# Patient Record
Sex: Male | Born: 1970 | Race: Black or African American | Hispanic: No | Marital: Married | Smoking: Former smoker
Health system: Southern US, Community
[De-identification: ages and names within clinical notes are randomized; demographics above are authoritative.]

## PROBLEM LIST (undated history)

## (undated) DIAGNOSIS — E785 Hyperlipidemia, unspecified: Secondary | ICD-10-CM

## (undated) DIAGNOSIS — E291 Testicular hypofunction: Secondary | ICD-10-CM

## (undated) DIAGNOSIS — G4730 Sleep apnea, unspecified: Secondary | ICD-10-CM

## (undated) DIAGNOSIS — R7303 Prediabetes: Secondary | ICD-10-CM

## (undated) DIAGNOSIS — I10 Essential (primary) hypertension: Secondary | ICD-10-CM

## (undated) DIAGNOSIS — E119 Type 2 diabetes mellitus without complications: Secondary | ICD-10-CM

## (undated) HISTORY — DX: Testicular hypofunction: E29.1

## (undated) HISTORY — DX: Essential (primary) hypertension: I10

## (undated) HISTORY — DX: Type 2 diabetes mellitus without complications: E11.9

## (undated) HISTORY — DX: Hyperlipidemia, unspecified: E78.5

## (undated) HISTORY — DX: Sleep apnea, unspecified: G47.30

## (undated) HISTORY — DX: Prediabetes: R73.03

---

## 1998-12-10 HISTORY — PX: SHOULDER ARTHROSCOPY: SHX128

## 1999-03-01 ENCOUNTER — Emergency Department (HOSPITAL_COMMUNITY): Admission: EM | Admit: 1999-03-01 | Discharge: 1999-03-01 | Payer: Self-pay | Admitting: *Deleted

## 1999-03-01 ENCOUNTER — Encounter: Payer: Self-pay | Admitting: Emergency Medicine

## 2001-08-05 ENCOUNTER — Ambulatory Visit (HOSPITAL_BASED_OUTPATIENT_CLINIC_OR_DEPARTMENT_OTHER): Admission: RE | Admit: 2001-08-05 | Discharge: 2001-08-05 | Payer: Self-pay | Admitting: Orthopaedic Surgery

## 2004-11-09 ENCOUNTER — Emergency Department (HOSPITAL_COMMUNITY): Admission: EM | Admit: 2004-11-09 | Discharge: 2004-11-09 | Payer: Self-pay | Admitting: Emergency Medicine

## 2005-08-06 ENCOUNTER — Emergency Department (HOSPITAL_COMMUNITY): Admission: EM | Admit: 2005-08-06 | Discharge: 2005-08-06 | Payer: Self-pay | Admitting: Emergency Medicine

## 2005-12-10 HISTORY — PX: ROTATOR CUFF REPAIR: SHX139

## 2007-08-22 ENCOUNTER — Emergency Department (HOSPITAL_COMMUNITY): Admission: EM | Admit: 2007-08-22 | Discharge: 2007-08-22 | Payer: Self-pay | Admitting: Emergency Medicine

## 2008-08-30 ENCOUNTER — Emergency Department (HOSPITAL_COMMUNITY): Admission: EM | Admit: 2008-08-30 | Discharge: 2008-08-31 | Payer: Self-pay | Admitting: Emergency Medicine

## 2011-03-13 ENCOUNTER — Ambulatory Visit (HOSPITAL_BASED_OUTPATIENT_CLINIC_OR_DEPARTMENT_OTHER): Payer: 59 | Attending: Otolaryngology

## 2011-03-13 DIAGNOSIS — R0609 Other forms of dyspnea: Secondary | ICD-10-CM | POA: Insufficient documentation

## 2011-03-13 DIAGNOSIS — R0989 Other specified symptoms and signs involving the circulatory and respiratory systems: Secondary | ICD-10-CM | POA: Insufficient documentation

## 2011-03-13 DIAGNOSIS — G4733 Obstructive sleep apnea (adult) (pediatric): Secondary | ICD-10-CM | POA: Insufficient documentation

## 2011-03-24 DIAGNOSIS — R0989 Other specified symptoms and signs involving the circulatory and respiratory systems: Secondary | ICD-10-CM

## 2011-03-24 DIAGNOSIS — R0609 Other forms of dyspnea: Secondary | ICD-10-CM

## 2011-03-24 DIAGNOSIS — G4733 Obstructive sleep apnea (adult) (pediatric): Secondary | ICD-10-CM

## 2011-03-25 NOTE — Procedures (Signed)
NAME:  Mike Cohen, Mike Cohen NO.:  [id number]  MEDICAL RECORD NO.:  [id number]          PATIENT TYPE:  OUT  LOCATION:  SLEEP CENTER                 FACILITY:  [facility]  PHYSICIAN:  Clinton D. Maple Hudson, MD, FCCP, FACPDATE OF BIRTH:  05-04-1971  DATE OF STUDY:  03/13/2011                           NOCTURNAL POLYSOMNOGRAM  REFERRING PHYSICIAN:  CHRISTOPHER E NEWMAN  INDICATION FOR STUDY:  Hypersomnia with sleep apnea.  EPWORTH SLEEPINESS SCORE:  11/24, BMI 36.9.  Weight 250 pounds, height 69 inches.  Neck 17 inches.  MEDICATIONS:  Home medications are charted and reviewed.  SLEEP ARCHITECTURE:  Split-study protocol.  During the diagnostic phase, total sleep time 123.5 minutes.  Sleep efficiency 77.4%.  Stage I was 9.7%, stage II 90.3%, stages III and REM were absent.  Sleep latency 16.5 minutes, awake after sleep onset 20.5 minutes, arousal index 30.1.  BEDTIME MEDICATION:  An over-the-counter sleep aid was taken.  RESPIRATORY DATA:  Split-study protocol.  Apnea-hypopnea index (AHI) 31.1 per hour.  A total of 64 events was scored including 28 obstructive apneas, 14 central apneas, 12 mixed apneas, 10 hypopneas.  Events were seen in all sleep positions, especially while supine.  CPAP was then titrated to 12 CWP, AHI 0 per hour.  He wore a medium ResMed Quattro FX mask with heated humidifier.  OXYGEN DATA:  Moderate snoring before CPAP with oxygen desaturation to a nadir of 79% on room air.  With CPAP titration, mean oxygen saturation held 94.5% on room air and snoring was prevented.  CARDIAC DATA:  Normal sinus rhythm.  MOVEMENT-PARASOMNIA:  A few limb jerks were noted during the titration phase with insignificant effect on sleep.  Bathroom x1.  IMPRESSIONS-RECOMMENDATIONS: 1. Moderately severe obstructive sleep apnea/hypopnea syndrome, AHI     31.1 per hour with obstructive and central events noted.  Events     were more common while supine.  Moderate snoring with  oxygen     desaturation to a nadir of 79% on room air. 2. Successful CPAP titration to 12 CWP, AHI 0 per hour.  He wore a     medium ResMed Quattro FX mask with heated humidifier.  Oxygenation     was normalized and snoring prevented.     Clinton D. Maple Hudson, MD, [facility], FACP Diplomate, Biomedical engineer of Sleep Medicine Electronically Signed    CDY/MEDQ  D:  03/24/2011 13:24:47  T:  03/25/2011 05:53:47  Job:  [id number]

## 2011-04-27 NOTE — Op Note (Signed)
[place]  Patient:    Mike Cohen, Mike Cohen Visit Number: [id number] MRN: [id number]          Service Type: DSU Location: [facility] Attending Physician:  Marcene Corning Proc. Date: 08/05/01 Adm. Date:  08/05/2001                             Operative Report  PREOPERATIVE DIAGNOSIS: 1. Right shoulder acromioclavicular pain. 2. Right shoulder instability.  POSTOPERATIVE DIAGNOSIS: 1. Right shoulder acromioclavicular pain. 2. Right shoulder instability.  OPERATION PERFORMED: 1. Right shoulder acromioclavicular resection. 2. Right shoulder thermal capsulorrhaphy.  ANESTHESIA:  General and block.  ATTENDING SURGEON:  Lubertha Basque. Jerl Santos, M.D.  ASSISTANT:  Lindwood Qua, P.A.  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  He went through appropriate postoperative rehabilitation but has been left with a shoulder he does not trust.  He also has pain at the acromioclavicular joint.  He has undergone some preoperative studies which show no labral tear but what appears to be a large loose capsule.  This study was an MRI with an arthrogram. Planned procedure at this point is for thermal capsulorrhaphy through the arthroscope.  We will probably also address his AC joint.  The procedures were discussed with the patient and his family and informed operative consent was obtained after discussion of possible complications of reaction to anesthesia and infection.  DESCRIPTION OF PROCEDURE:  The patient was taken to an operating suite where general anesthetic was applied without difficulty.  He was also given a block in the pre-anesthesia area.  He was positioned in the beach chair position and prepped and draped in normal sterile fashion.  After administration of preop IV antibiotics, an arthroscopy of the right shoulder was performed through a total of four portals.  The  glenohumeral joint showed no degenerative change while the biceps tendon was well attached.  The rotator cuff was benign on undersurface inspection.  His labrum did appear to be attached at least loosely on the middle and inferior portions.  Almost certainly not a normal attachment but there was no frank separation between the labral structures and the glenoid below.  He did have very loose patulous capsule with a large inferior sulcus.  He had a positive drive through sign.  I placed a thermal probe in the shoulder and shrank up the inferior sulcus in the anterior tissues.  This did seem to decrease his amount of external rotation and tightened up the capsule but we did not obtain an excellent response of the tissue.  I then entered the subacromial space.  He had no impingement pain so the acromion was not addressed.  He had pain at the [facility] joint and a thorough AC decompression was done through an anterior portal.  About 1 cm space was created between the clavicle and the acromion.  The shoulder was thoroughly irrigated followed by placement of Marcaine with epinephrine and morphine. Simple sutures of nylon were used to reapproximate the portals followed by Adaptic and a dry gauze dressing with tape.  Estimated blood loss and intraoperative fluids can be obtained from Anesthesia records.  DISPOSITION:  The patient was extubated in the operating room and taken to the recovery room in stable condition.  He had some difficulty coming out from the anesthesia and some desaturation, but was stable and taken to the recovery room.  Plans were  for him to go home the same day and to follow up in the office in less than a week.  I will contact him by phone tonight. Attending Physician:  Marcene Corning DD:  08/05/01 TD:  08/05/01 Job: [id number]

## 2011-07-26 ENCOUNTER — Encounter: Payer: Self-pay | Admitting: Internal Medicine

## 2011-09-10 LAB — COMPREHENSIVE METABOLIC PANEL
ALT: 37
AST: 32
Albumin: 3.7
Alkaline Phosphatase: 68
BUN: 8
GFR calc Af Amer: >60
Potassium: 3.7
Sodium: 140
Total Protein: 6.5

## 2011-09-10 LAB — DIFFERENTIAL
Basophils Relative: 0
Monocytes Absolute: 0.4
Monocytes Relative: 5
Neutro Abs: 3.5

## 2011-09-10 LAB — CBC
Platelets: 288
RDW: 14.4

## 2011-09-10 LAB — URINALYSIS, ROUTINE W REFLEX MICROSCOPIC
Ketones, ur: NEGATIVE
Nitrite: NEGATIVE
Protein, ur: NEGATIVE

## 2011-09-10 LAB — POCT CARDIAC MARKERS: Myoglobin, poc: 108

## 2013-11-02 ENCOUNTER — Telehealth: Payer: Self-pay | Admitting: Internal Medicine

## 2013-11-02 NOTE — Telephone Encounter (Signed)
PT HAVING SINUS INFECTION. EAR RINGING. HEAD CONGESTION. PHELM DARK YELLOW. PLEASE ADVISE IF OFFICE VISIT NEEDED OR RX CALLED IN TO [facility], [address]

## 2013-11-02 NOTE — Telephone Encounter (Signed)
Suggest try otc store brand of sudafed -PE with phenyl ephrine for ear symptoms and if not get better suggest ov

## 2013-11-03 NOTE — Telephone Encounter (Signed)
Agree that he needs appointment if the feel he needs an antibiotic

## 2013-11-03 NOTE — Telephone Encounter (Signed)
Pt's wife Cordelia Pen) aware of suggestions. Wife states that she is almost certain he has a sinus infection. I told her that he needed to make an appointment, but she is requesting antibiotic.

## 2013-11-04 NOTE — Telephone Encounter (Signed)
Pt aware.

## 2013-12-25 ENCOUNTER — Other Ambulatory Visit: Payer: Self-pay | Admitting: Emergency Medicine

## 2013-12-25 MED ORDER — TESTOSTERONE CYPIONATE 200 MG/ML IM SOLN: INTRAMUSCULAR | Status: DC

## 2014-02-10 ENCOUNTER — Encounter: Payer: Self-pay | Admitting: Physician Assistant

## 2014-02-22 DIAGNOSIS — I10 Essential (primary) hypertension: Secondary | ICD-10-CM | POA: Insufficient documentation

## 2014-02-22 DIAGNOSIS — E782 Mixed hyperlipidemia: Secondary | ICD-10-CM | POA: Insufficient documentation

## 2014-02-22 DIAGNOSIS — R7303 Prediabetes: Secondary | ICD-10-CM | POA: Insufficient documentation

## 2014-02-22 DIAGNOSIS — E349 Endocrine disorder, unspecified: Secondary | ICD-10-CM | POA: Insufficient documentation

## 2014-02-23 ENCOUNTER — Encounter: Payer: Self-pay | Admitting: Internal Medicine

## 2014-03-23 ENCOUNTER — Other Ambulatory Visit: Payer: Self-pay | Admitting: Internal Medicine

## 2014-04-02 ENCOUNTER — Ambulatory Visit (INDEPENDENT_AMBULATORY_CARE_PROVIDER_SITE_OTHER): Payer: 59 | Admitting: Internal Medicine

## 2014-04-02 VITALS — BP 140/92 | HR 76 | Temp 98.1°F | Resp 16 | Ht 70.25 in | Wt 253.4 lb

## 2014-04-02 DIAGNOSIS — E785 Hyperlipidemia, unspecified: Secondary | ICD-10-CM

## 2014-04-02 DIAGNOSIS — Z23 Encounter for immunization: Secondary | ICD-10-CM | POA: Insufficient documentation

## 2014-04-02 DIAGNOSIS — E559 Vitamin D deficiency, unspecified: Secondary | ICD-10-CM

## 2014-04-02 DIAGNOSIS — R7309 Other abnormal glucose: Secondary | ICD-10-CM

## 2014-04-02 DIAGNOSIS — Z9989 Dependence on other enabling machines and devices: Secondary | ICD-10-CM

## 2014-04-02 DIAGNOSIS — R7303 Prediabetes: Secondary | ICD-10-CM

## 2014-04-02 DIAGNOSIS — Z79899 Other long term (current) drug therapy: Secondary | ICD-10-CM

## 2014-04-02 DIAGNOSIS — E291 Testicular hypofunction: Secondary | ICD-10-CM

## 2014-04-02 DIAGNOSIS — I10 Essential (primary) hypertension: Secondary | ICD-10-CM

## 2014-04-02 DIAGNOSIS — M79609 Pain in unspecified limb: Secondary | ICD-10-CM

## 2014-04-02 DIAGNOSIS — K589 Irritable bowel syndrome without diarrhea: Secondary | ICD-10-CM | POA: Insufficient documentation

## 2014-04-02 DIAGNOSIS — G4733 Obstructive sleep apnea (adult) (pediatric): Secondary | ICD-10-CM | POA: Insufficient documentation

## 2014-04-02 LAB — CBC WITH DIFFERENTIAL/PLATELET
BASOS ABS: 0 10*3/uL (ref 0.0–0.1)
BASOS PCT: 1 % (ref 0–1)
EOS ABS: 0.2 10*3/uL (ref 0.0–0.7)
Eosinophils Relative: 4 % (ref 0–5)
HEMATOCRIT: 45.6 % (ref 39.0–52.0)
HEMOGLOBIN: 16 g/dL (ref 13.0–17.0)
Lymphocytes Relative: 48 % — ABNORMAL HIGH (ref 12–46)
Lymphs Abs: 2.1 10*3/uL (ref 0.7–4.0)
MCH: 26.6 pg (ref 26.0–34.0)
MCHC: 35.1 g/dL (ref 30.0–36.0)
MCV: 75.7 fL — ABNORMAL LOW (ref 78.0–100.0)
MONO ABS: 0.3 10*3/uL (ref 0.1–1.0)
MONOS PCT: 7 % (ref 3–12)
NEUTROS ABS: 1.8 10*3/uL (ref 1.7–7.7)
Neutrophils Relative %: 40 % — ABNORMAL LOW (ref 43–77)
Platelets: 237 10*3/uL (ref 150–400)
RBC: 6.02 MIL/uL — ABNORMAL HIGH (ref 4.22–5.81)
RDW: 14.4 % (ref 11.5–15.5)
WBC: 4.4 10*3/uL (ref 4.0–10.5)

## 2014-04-02 LAB — HEMOGLOBIN A1C
HEMOGLOBIN A1C: 6.2 % — AB (ref <5.7)
MEAN PLASMA GLUCOSE: 131 mg/dL — AB (ref <117)

## 2014-04-02 NOTE — Patient Instructions (Signed)

## 2014-04-02 NOTE — Progress Notes (Signed)
Patient ID: Mike Cohen, male   DOB: 13-Feb-1971, 43 y.o.   MRN: [id number]    This very nice 43 y.o. MBM presents for 3 month follow up with Hypertension, Hyperlipidemia, OSA/CPAP, Pre-Diabetes and Vitamin D Deficiency.    HTN predates since 2007. BP has been controlled at home. Today's BP: 140/92 mmHg . Patient denies any cardiac type chest pain, palpitations, dyspnea/orthopnea/PND, dizziness, claudication, or dependent edema.   Hyperlipidemia is controlled with diet & meds. Last Cholesterol was 152, Triglycerides were 115, HDL 29 and LDL 100. Patient denies myalgias or other med SE's.    Also, the patient has history of PreDiabetes with A1c 5.9% in Nov 2013 and last A1c was 6.0% in Dec 2013. Patient denies any symptoms of reactive hypoglycemia, diabetic polys, paresthesias or visual blurring.   Further, Patient has history of Vitamin D Deficiency of 2 in 2009 and last vitamin D was 37 in Nov 2013. Patient supplements vitamin D without any suspected side-effects.  Medication Sig  . Cholecalciferol (VITAMIN D PO) Take 2,000 Int'l Units by mouth daily.  . rosuvastatin (CRESTOR) 20 MG tablet Take 20 mg by mouth daily.  . SYRINGE-NEEDLE, DISP, 3 ML (B-D 3CC LUER-LOK SYR 21GX1-1/2) 21G X 1-1/2" 3 ML  Use as directed, DX 250.00  . DEPOTESTOTERONE CYPIONATE 200 MG/ML  2cc q 2weeks   Allergies no known allergies  PMHx:   Past Medical History  Diagnosis Date  . Hypertension   . Prediabetes   . Other testicular hypofunction   . Hyperlipidemia    FHx:    Reviewed / unchanged  SHx:    Reviewed / unchanged   Systems Review: Constitutional: Denies fever, chills, wt changes, headaches, insomnia, fatigue, night sweats, change in appetite. Eyes: Denies redness, blurred vision, diplopia, discharge, itchy, watery eyes.  ENT: Denies discharge, congestion, post nasal drip, epistaxis, sore throat, earache, hearing loss, dental pain, tinnitus, vertigo, sinus pain, snoring.  CV: Denies chest pain,  palpitations, irregular heartbeat, syncope, dyspnea, diaphoresis, orthopnea, PND, claudication, edema. Respiratory: denies cough, dyspnea, DOE, pleurisy, hoarseness, laryngitis, wheezing.  Gastrointestinal: Denies dysphagia, odynophagia, heartburn, reflux, water brash, abdominal pain or cramps, nausea, vomiting, bloating, diarrhea, constipation, hematemesis, melena, hematochezia,  or hemorrhoids. Genitourinary: Denies dysuria, frequency, urgency, nocturia, hesitancy, discharge, hematuria, flank pain. Musculoskeletal: Denies arthralgias, myalgias, stiffness, jt. swelling, pain, limp, strain/sprain.  Skin: Denies pruritus, rash, hives, warts, acne, eczema, change in skin lesion(s). Neuro: No weakness, tremor, incoordination, spasms, paresthesia, or pain. Psychiatric: Denies confusion, memory loss, or sensory loss. Endo: Denies change in weight, skin, hair change.  Heme/Lymph: No excessive bleeding, bruising, orenlarged lymph nodes.  Exam:  BP 140/92  Pulse 76  Temp 98.1 F   Resp 16  Ht 5' 10.25"   Wt 253 lb 6.4 oz   BMI 36.11 kg/m2  Appears well nourished - in no distress. Eyes: PERRLA, EOMs, conjunctiva no swelling or erythema. Sinuses: No frontal/maxillary tenderness ENT/Mouth: EAC's clear, TM's nl w/o erythema, bulging. Nares clear w/o erythema, swelling, exudates. Oropharynx clear without erythema or exudates. Oral hygiene is good. Tongue normal, non obstructing. Hearing intact.  Neck: Supple. Thyroid nl. Car 2+/2+ without bruits, nodes or JVD. Chest: Respirations nl with BS clear & equal w/o rales, rhonchi, wheezing or stridor.  Cor: Heart sounds normal w/ regular rate and rhythm without sig. murmurs, gallops, clicks, or rubs. Peripheral pulses normal and equal  without edema.  Abdomen: Soft & bowel sounds normal. Non-tender w/o guarding, rebound, hernias, masses, or organomegaly.  Lymphatics: Unremarkable.  Musculoskeletal: Full ROM  all peripheral extremities, joint stability, 5/5  strength, and normal gait.  Skin: Warm, dry without exposed rashes, lesions, ecchymosis apparent.  Neuro: Cranial nerves intact, reflexes equal bilaterally. Sensory-motor testing grossly intact. Tendon reflexes grossly intact.  Pysch: Alert & oriented x 3. Insight and judgement nl & appropriate. No ideations.  Assessment and Plan:  1. Hypertension - Continue monitor blood pressure at home. Continue diet/meds same.  2. Hyperlipidemia - Continue diet/meds, exercise,& lifestyle modifications. Continue monitor periodic cholesterol/liver & renal functions   3. Pre-diabetes - Continue diet, exercise, lifestyle modifications. Monitor appropriate labs.  4. Vitamin D Deficiency - Continue supplementation.  Recommended regular exercise, BP monitoring, weight control, and discussed med and SE's. Recommended labs to assess and monitor clinical status. Further disposition pending results of labs.

## 2014-04-03 ENCOUNTER — Encounter: Payer: Self-pay | Admitting: Internal Medicine

## 2014-04-03 LAB — LIPID PANEL
CHOLESTEROL: 129 mg/dL (ref 0–200)
HDL: 27 mg/dL — ABNORMAL LOW (ref >39)
LDL Cholesterol: 73 mg/dL (ref 0–99)
TRIGLYCERIDES: 143 mg/dL (ref <150)
Total CHOL/HDL Ratio: 4.8 Ratio
VLDL: 29 mg/dL (ref 0–40)

## 2014-04-03 LAB — INSULIN, FASTING: INSULIN FASTING, SERUM: 14 u[IU]/mL (ref 3–28)

## 2014-04-03 LAB — HEPATIC FUNCTION PANEL
ALBUMIN: 4.3 g/dL (ref 3.5–5.2)
ALT: 35 U/L (ref 0–53)
AST: 47 U/L — ABNORMAL HIGH (ref 0–37)
Alkaline Phosphatase: 44 U/L (ref 39–117)
Bilirubin, Direct: 0.1 mg/dL (ref 0.0–0.3)
Indirect Bilirubin: 0.4 mg/dL (ref 0.2–1.2)
TOTAL PROTEIN: 6.9 g/dL (ref 6.0–8.3)
Total Bilirubin: 0.5 mg/dL (ref 0.2–1.2)

## 2014-04-03 LAB — MAGNESIUM: MAGNESIUM: 1.8 mg/dL (ref 1.5–2.5)

## 2014-04-03 LAB — BASIC METABOLIC PANEL WITH GFR
BUN: 13 mg/dL (ref 6–23)
CALCIUM: 9.4 mg/dL (ref 8.4–10.5)
CO2: 27 mEq/L (ref 19–32)
CREATININE: 1.1 mg/dL (ref 0.50–1.35)
Chloride: 103 mEq/L (ref 96–112)
GFR, EST NON AFRICAN AMERICAN: 82 mL/min
GLUCOSE: 82 mg/dL (ref 70–99)
Potassium: 4.6 mEq/L (ref 3.5–5.3)
Sodium: 142 mEq/L (ref 135–145)

## 2014-04-03 LAB — TESTOSTERONE: TESTOSTERONE: 356 ng/dL (ref 300–890)

## 2014-04-03 LAB — TSH: TSH: 0.501 u[IU]/mL (ref 0.350–4.500)

## 2014-04-03 LAB — VITAMIN D 25 HYDROXY (VIT D DEFICIENCY, FRACTURES): VIT D 25 HYDROXY: 91 ng/mL — AB (ref 30–89)

## 2014-04-03 LAB — CK: CK TOTAL: 1723 U/L — AB (ref 7–232)

## 2014-05-17 ENCOUNTER — Other Ambulatory Visit: Payer: Self-pay | Admitting: Emergency Medicine

## 2014-05-17 ENCOUNTER — Other Ambulatory Visit: Payer: Self-pay | Admitting: *Deleted

## 2014-05-17 MED ORDER — "SYRINGE/NEEDLE (DISP) 21G X 1-1/2"" 3 ML MISC": Status: DC

## 2014-05-17 MED ORDER — TESTOSTERONE CYPIONATE 200 MG/ML IM SOLN: INTRAMUSCULAR | Status: DC

## 2014-07-12 ENCOUNTER — Other Ambulatory Visit: Payer: Self-pay | Admitting: Internal Medicine

## 2014-07-12 MED ORDER — TADALAFIL 20 MG PO TABS: ORAL_TABLET | ORAL | Status: DC

## 2014-07-23 ENCOUNTER — Other Ambulatory Visit: Payer: Self-pay | Admitting: *Deleted

## 2014-07-23 MED ORDER — TADALAFIL 20 MG PO TABS: ORAL_TABLET | ORAL | Status: DC

## 2014-09-22 ENCOUNTER — Ambulatory Visit (INDEPENDENT_AMBULATORY_CARE_PROVIDER_SITE_OTHER): Payer: 59 | Admitting: Internal Medicine

## 2014-09-22 ENCOUNTER — Ambulatory Visit: Payer: Self-pay | Admitting: Physician Assistant

## 2014-09-22 ENCOUNTER — Encounter: Payer: Self-pay | Admitting: Internal Medicine

## 2014-09-22 VITALS — BP 122/80 | HR 84 | Temp 98.1°F | Resp 16 | Ht 70.25 in | Wt 256.4 lb

## 2014-09-22 DIAGNOSIS — R7303 Prediabetes: Secondary | ICD-10-CM

## 2014-09-22 DIAGNOSIS — E785 Hyperlipidemia, unspecified: Secondary | ICD-10-CM

## 2014-09-22 DIAGNOSIS — Z23 Encounter for immunization: Secondary | ICD-10-CM

## 2014-09-22 DIAGNOSIS — I10 Essential (primary) hypertension: Secondary | ICD-10-CM

## 2014-09-22 DIAGNOSIS — R7309 Other abnormal glucose: Secondary | ICD-10-CM

## 2014-09-22 DIAGNOSIS — E559 Vitamin D deficiency, unspecified: Secondary | ICD-10-CM

## 2014-09-22 DIAGNOSIS — Z79899 Other long term (current) drug therapy: Secondary | ICD-10-CM

## 2014-09-22 LAB — CBC WITH DIFFERENTIAL/PLATELET
BASOS ABS: 0 10*3/uL (ref 0.0–0.1)
BASOS PCT: 0 % (ref 0–1)
EOS ABS: 0.1 10*3/uL (ref 0.0–0.7)
Eosinophils Relative: 2 % (ref 0–5)
HCT: 49 % (ref 39.0–52.0)
HEMOGLOBIN: 17 g/dL (ref 13.0–17.0)
Lymphocytes Relative: 45 % (ref 12–46)
Lymphs Abs: 2.6 10*3/uL (ref 0.7–4.0)
MCH: 27.1 pg (ref 26.0–34.0)
MCHC: 34.7 g/dL (ref 30.0–36.0)
MCV: 78.1 fL (ref 78.0–100.0)
MONO ABS: 0.3 10*3/uL (ref 0.1–1.0)
MONOS PCT: 6 % (ref 3–12)
NEUTROS PCT: 47 % (ref 43–77)
Neutro Abs: 2.7 10*3/uL (ref 1.7–7.7)
Platelets: 255 10*3/uL (ref 150–400)
RBC: 6.27 MIL/uL — ABNORMAL HIGH (ref 4.22–5.81)
RDW: 14.3 % (ref 11.5–15.5)
WBC: 5.8 10*3/uL (ref 4.0–10.5)

## 2014-09-22 LAB — HEMOGLOBIN A1C
HEMOGLOBIN A1C: 6 % — AB (ref <5.7)
MEAN PLASMA GLUCOSE: 126 mg/dL — AB (ref <117)

## 2014-09-22 MED ORDER — DOXYCYCLINE HYCLATE 100 MG PO CAPS
ORAL_CAPSULE | ORAL | Status: DC
Start: 2014-09-22 — End: 2015-07-20

## 2014-09-22 NOTE — Patient Instructions (Signed)

## 2014-09-22 NOTE — Progress Notes (Signed)
Patient ID: Mike Cohen, male   DOB: [birth year], 43 y.o.   MRN: [id number]   This very nice 43 y.o.MWM presents for 3 month follow up with Hypertension, Hyperlipidemia, Pre-Diabetes and Vitamin D Deficiency. Pt also c/o of tender lumps in each cheek and a bumpy rash on th posterior neck.   Patient is treated for HTN (2007) & BP has been controlled at home. Today's BP: 122/80 mmHg. Patient has had no complaints of any cardiac type chest pain, palpitations, dyspnea/orthopnea/PND, dizziness, claudication, or dependent edema.   Hyperlipidemia is controlled with diet & meds. Patient denies myalgias or other med SE's. Last Lipids were Total Cholesterol 129; HDL  27*; LDL  73; Triglycerides 143 on 04/02/2014.   Also, the patient has history of T2_NIDDM PreDiabetes and has had no symptoms of reactive hypoglycemia, diabetic polys, paresthesias or visual blurring.  Last A1c was  6.2% on 04/02/2014.    Further, the patient also has history of Vitamin D Deficiency and supplements vitamin D without any suspected side-effects. Last vitamin D was  91 on 04/02/2014.   Medication List   rosuvastatin 20 MG tablet  Commonly known as:  CRESTOR  Take 20 mg by mouth daily.     SYRINGE-NEEDLE (DISP) 3 ML 21G X 1-1/2" 3 ML Misc  Commonly known as:  B-D 3CC LUER-LOK SYR 21GX1-1/2  Use as directed, DX 250.00     tadalafil 20 MG tablet  Commonly known as:  CIALIS  Take 1/2 to 1 tablet every 2 to 3 days as needed for XXXX     testosterone cypionate 200 MG/ML injection  Commonly known as:  DEPOTESTOTERONE CYPIONATE  2cc q 2weeks     VITAMIN D PO  Take 2,000 Int'l Units by mouth daily.     No Known Allergies  PMHx:   Past Medical History  Diagnosis Date  . Hypertension   . Prediabetes   . Other testicular hypofunction   . Hyperlipidemia    Immunization History  Administered Date(s) Administered  . Influenza Split 09/22/2014  . Td 10/13/2002   Past Surgical History  Procedure Laterality Date  .  Shoulder arthroscopy Right 2000  . Rotator cuff repair Right 2007   FHx:    Reviewed / unchanged  SHx:    Reviewed / unchanged  Systems Review:  Constitutional: Denies fever, chills, wt changes, headaches, insomnia, fatigue, night sweats, change in appetite. Eyes: Denies redness, blurred vision, diplopia, discharge, itchy, watery eyes.  ENT: Denies discharge, congestion, post nasal drip, epistaxis, sore throat, earache, hearing loss, dental pain, tinnitus, vertigo, sinus pain, snoring.  CV: Denies chest pain, palpitations, irregular heartbeat, syncope, dyspnea, diaphoresis, orthopnea, PND, claudication or edema. Respiratory: denies cough, dyspnea, DOE, pleurisy, hoarseness, laryngitis, wheezing.  Gastrointestinal: Denies dysphagia, odynophagia, heartburn, reflux, water brash, abdominal pain or cramps, nausea, vomiting, bloating, diarrhea, constipation, hematemesis, melena, hematochezia  or hemorrhoids. Genitourinary: Denies dysuria, frequency, urgency, nocturia, hesitancy, discharge, hematuria or flank pain. Musculoskeletal: Denies arthralgias, myalgias, stiffness, jt. swelling, pain, limping or strain/sprain.  Skin: Denies pruritus, rash, hives, warts, acne, eczema or change in skin lesion(s). Neuro: No weakness, tremor, incoordination, spasms, paresthesia or pain. Psychiatric: Denies confusion, memory loss or sensory loss. Endo: Denies change in weight, skin or hair change.  Heme/Lymph: No excessive bleeding, bruising or enlarged lymph nodes.  Exam:  BP 122/80  Pulse 84  Temp(Src) 98.1 F (36.7 C) (Temporal)  Resp 16  Ht 5' 10.25" (1.784 m)  Wt 256 lb 6.4 oz (116.302 kg)  BMI 36.54 kg/m2  Appears well nourished and in no distress. Eyes: PERRLA, EOMs, conjunctiva no swelling or erythema. Sinuses: No frontal/maxillary tenderness ENT/Mouth: EAC's clear, TM's nl w/o erythema, bulging. Nares clear w/o erythema, swelling, exudates. Oropharynx clear without erythema or exudates. Oral  hygiene is good. Tongue normal, non obstructing. Hearing intact.  Neck: Supple. Thyroid nl. Car 2+/2+ without bruits, nodes or JVD. Chest: Respirations nl with BS clear & equal w/o rales, rhonchi, wheezing or stridor.  Cor: Heart sounds normal w/ regular rate and rhythm without sig. murmurs, gallops, clicks, or rubs. Peripheral pulses normal and equal  without edema.  Abdomen: Soft & bowel sounds normal. Non-tender w/o guarding, rebound, hernias, masses, or organomegaly.  Lymphatics: Unremarkable.  Musculoskeletal: Full ROM all peripheral extremities, joint stability, 5/5 strength, and normal gait.  Skin: Warm, dry without exposed rashes or ecchymosis apparent. Has a crop of ~ # 20  fleshy firm raised 3-4 mm lesions on nape of neck. Also 1 cm subcutaneous cystic not fixed lesions of Lt zygoma area  & above Rt nasolabial fold.  Neuro: Cranial nerves intact, reflexes equal bilaterally. Sensory-motor testing grossly intact. Tendon reflexes grossly intact.  Pysch: Alert & oriented x 3.  Insight and judgement nl & appropriate. No ideations.  Assessment and Plan:  1. Hypertension - Continue monitor blood pressure at home. Continue diet/meds same.  2. Hyperlipidemia - Continue diet/meds, exercise,& lifestyle modifications. Continue monitor periodic cholesterol/liver & renal functions   3. Pre-Diabetes - Continue diet, exercise, lifestyle modifications. Monitor appropriate labs.  4. Vitamin D Deficiency - Continue supplementation.  5. Infected sebaceous cysts of Bilat face. - Rx Doxycycline 100 mg #30 x 1 rf - bid x 2 weeks   6. 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 Recommended labs to assess and monitor clinical status. Further disposition pending results of labs.

## 2014-09-23 LAB — HEPATIC FUNCTION PANEL
ALT: 35 U/L (ref 0–53)
AST: 35 U/L (ref 0–37)
Albumin: 4.6 g/dL (ref 3.5–5.2)
Alkaline Phosphatase: 44 U/L (ref 39–117)
Bilirubin, Direct: 0.1 mg/dL (ref 0.0–0.3)
Indirect Bilirubin: 0.5 mg/dL (ref 0.2–1.2)
TOTAL PROTEIN: 6.9 g/dL (ref 6.0–8.3)
Total Bilirubin: 0.6 mg/dL (ref 0.2–1.2)

## 2014-09-23 LAB — MAGNESIUM: MAGNESIUM: 1.7 mg/dL (ref 1.5–2.5)

## 2014-09-23 LAB — LIPID PANEL
CHOLESTEROL: 152 mg/dL (ref 0–200)
HDL: 25 mg/dL — ABNORMAL LOW (ref >39)
LDL Cholesterol: 72 mg/dL (ref 0–99)
Total CHOL/HDL Ratio: 6.1 Ratio
Triglycerides: 276 mg/dL — ABNORMAL HIGH (ref <150)
VLDL: 55 mg/dL — ABNORMAL HIGH (ref 0–40)

## 2014-09-23 LAB — BASIC METABOLIC PANEL WITH GFR
BUN: 9 mg/dL (ref 6–23)
CALCIUM: 9.8 mg/dL (ref 8.4–10.5)
CO2: 21 mEq/L (ref 19–32)
Chloride: 102 mEq/L (ref 96–112)
Creat: 1.25 mg/dL (ref 0.50–1.35)
GFR, EST AFRICAN AMERICAN: 82 mL/min
GFR, EST NON AFRICAN AMERICAN: 71 mL/min
GLUCOSE: 127 mg/dL — AB (ref 70–99)
Potassium: 4 mEq/L (ref 3.5–5.3)
SODIUM: 138 meq/L (ref 135–145)

## 2014-09-23 LAB — VITAMIN D 25 HYDROXY (VIT D DEFICIENCY, FRACTURES): VIT D 25 HYDROXY: 104 ng/mL — AB (ref 30–89)

## 2014-09-23 LAB — TSH: TSH: 1.146 u[IU]/mL (ref 0.350–4.500)

## 2014-09-23 LAB — INSULIN, FASTING: INSULIN FASTING, SERUM: 75 u[IU]/mL — AB (ref 2.0–19.6)

## 2014-10-06 ENCOUNTER — Ambulatory Visit: Payer: Self-pay | Admitting: Internal Medicine

## 2015-02-10 ENCOUNTER — Other Ambulatory Visit: Payer: Self-pay | Admitting: Internal Medicine

## 2015-02-10 DIAGNOSIS — E349 Endocrine disorder, unspecified: Secondary | ICD-10-CM

## 2015-02-24 ENCOUNTER — Encounter: Payer: Self-pay | Admitting: Internal Medicine

## 2015-06-13 ENCOUNTER — Other Ambulatory Visit: Payer: Self-pay | Admitting: Internal Medicine

## 2015-07-20 ENCOUNTER — Encounter: Payer: Self-pay | Admitting: Internal Medicine

## 2015-07-20 ENCOUNTER — Ambulatory Visit (INDEPENDENT_AMBULATORY_CARE_PROVIDER_SITE_OTHER): Payer: 59 | Admitting: Internal Medicine

## 2015-07-20 VITALS — BP 126/84 | HR 88 | Temp 97.5°F | Resp 16 | Ht 70.25 in | Wt 250.0 lb

## 2015-07-20 DIAGNOSIS — Z79899 Other long term (current) drug therapy: Secondary | ICD-10-CM

## 2015-07-20 DIAGNOSIS — R7303 Prediabetes: Secondary | ICD-10-CM

## 2015-07-20 DIAGNOSIS — Z9989 Dependence on other enabling machines and devices: Secondary | ICD-10-CM

## 2015-07-20 DIAGNOSIS — E291 Testicular hypofunction: Secondary | ICD-10-CM | POA: Diagnosis not present

## 2015-07-20 DIAGNOSIS — R7309 Other abnormal glucose: Secondary | ICD-10-CM

## 2015-07-20 DIAGNOSIS — I10 Essential (primary) hypertension: Secondary | ICD-10-CM

## 2015-07-20 DIAGNOSIS — G4733 Obstructive sleep apnea (adult) (pediatric): Secondary | ICD-10-CM | POA: Diagnosis not present

## 2015-07-20 DIAGNOSIS — E559 Vitamin D deficiency, unspecified: Secondary | ICD-10-CM

## 2015-07-20 DIAGNOSIS — E349 Endocrine disorder, unspecified: Secondary | ICD-10-CM

## 2015-07-20 DIAGNOSIS — E785 Hyperlipidemia, unspecified: Secondary | ICD-10-CM | POA: Diagnosis not present

## 2015-07-20 LAB — HEPATIC FUNCTION PANEL
ALBUMIN: 4.2 g/dL (ref 3.6–5.1)
ALK PHOS: 33 U/L — AB (ref 40–115)
ALT: 40 U/L (ref 9–46)
AST: 47 U/L — AB (ref 10–40)
BILIRUBIN DIRECT: 0.1 mg/dL (ref <=0.2)
BILIRUBIN INDIRECT: 0.5 mg/dL (ref 0.2–1.2)
BILIRUBIN TOTAL: 0.6 mg/dL (ref 0.2–1.2)
Total Protein: 6.6 g/dL (ref 6.1–8.1)

## 2015-07-20 LAB — MAGNESIUM: MAGNESIUM: 1.8 mg/dL (ref 1.5–2.5)

## 2015-07-20 LAB — CBC WITH DIFFERENTIAL/PLATELET
Basophils Absolute: 0 10*3/uL (ref 0.0–0.1)
Basophils Relative: 0 % (ref 0–1)
EOS ABS: 0.2 10*3/uL (ref 0.0–0.7)
EOS PCT: 3 % (ref 0–5)
HEMATOCRIT: 48.7 % (ref 39.0–52.0)
HEMOGLOBIN: 16.7 g/dL (ref 13.0–17.0)
LYMPHS ABS: 1.9 10*3/uL (ref 0.7–4.0)
Lymphocytes Relative: 37 % (ref 12–46)
MCH: 26.6 pg (ref 26.0–34.0)
MCHC: 34.3 g/dL (ref 30.0–36.0)
MCV: 77.5 fL — ABNORMAL LOW (ref 78.0–100.0)
MPV: 9.1 fL (ref 8.6–12.4)
Monocytes Absolute: 0.3 10*3/uL (ref 0.1–1.0)
Monocytes Relative: 5 % (ref 3–12)
NEUTROS ABS: 2.8 10*3/uL (ref 1.7–7.7)
NEUTROS PCT: 55 % (ref 43–77)
Platelets: 205 10*3/uL (ref 150–400)
RBC: 6.28 MIL/uL — ABNORMAL HIGH (ref 4.22–5.81)
RDW: 14.7 % (ref 11.5–15.5)
WBC: 5 10*3/uL (ref 4.0–10.5)

## 2015-07-20 LAB — BASIC METABOLIC PANEL WITH GFR
BUN: 10 mg/dL (ref 7–25)
CALCIUM: 9.5 mg/dL (ref 8.6–10.3)
CHLORIDE: 103 mmol/L (ref 98–110)
CO2: 22 mmol/L (ref 20–31)
Creat: 1.01 mg/dL (ref 0.60–1.35)
GFR, Est African American: >89 mL/min (ref >=60)
GFR, Est Non African American: >89 mL/min (ref >=60)
GLUCOSE: 120 mg/dL — AB (ref 65–99)
POTASSIUM: 3.8 mmol/L (ref 3.5–5.3)
SODIUM: 137 mmol/L (ref 135–146)

## 2015-07-20 LAB — LIPID PANEL
CHOLESTEROL: 142 mg/dL (ref 125–200)
HDL: 23 mg/dL — AB (ref >=40)
LDL Cholesterol: 80 mg/dL (ref <130)
Total CHOL/HDL Ratio: 6.2 Ratio — ABNORMAL HIGH (ref <=5.0)
Triglycerides: 193 mg/dL — ABNORMAL HIGH (ref <150)
VLDL: 39 mg/dL — ABNORMAL HIGH (ref <30)

## 2015-07-20 NOTE — Patient Instructions (Signed)

## 2015-07-20 NOTE — Progress Notes (Signed)
Patient ID: Mike Cohen, male   DOB: [birth year], 44 y.o.   MRN: [id number]     This very nice 44 y.o. MBM presents for 6 month follow up with Hypertension, Hyperlipidemia, Pre-Diabetes and Vitamin D Deficiency.      Patient is treated for HTNsince 2007 & BP has been controlled at home. Today's BP: 126/84 mmHg. Patient has had no complaints of any cardiac type chest pain, palpitations, dyspnea/orthopnea/PND, dizziness, claudication, or dependent edema.     Hyperlipidemia is controlled with diet . Patient denies myalgias or other med SE's. Last Lipids were at goal - Cholesterol 152; HDL 25*; LDL 72; with elevated Triglycerides 276 on 09/22/2014.      Also, the patient has history of PreDiabetes since Aug 2012 with A1c 6.5% an later 6.1% in 2012 and has had no symptoms of reactive hypoglycemia, diabetic polys, paresthesias or visual blurring.  He has attempted dietary compliance and last A1c was still high at 6.0% on 09/22/2014 commensurate with his dietary non compliance     Further, the patient also has history of Vitamin D Deficiency of "9" in 2009 and supplements vitamin D without any suspected side-effects. Last vitamin D was 104 on 09/22/2014.  Medication Sig  . VITAMIN D  Take 2,000 Int'l Units by mouth daily.  . tadalafil (CIALIS) 20 MG tablet Take 1/2 to 1 tablet every 2 to 3 days as needed for XXXX  . DEPO-TESTOTERONE  200 MG/ML inj INJECT 1 Ml / IM  EVERY 1 WEEK   No Known Allergies  PMHx:   Past Medical History  Diagnosis Date  . Hypertension   . Prediabetes   . Other testicular hypofunction   . Hyperlipidemia    Immunization History  Administered Date(s) Administered  . Influenza Split 09/22/2014  . Td 10/13/2002   Past Surgical History  Procedure Laterality Date  . Shoulder arthroscopy Right 2000  . Rotator cuff repair Right 2007   FHx:    Reviewed / unchanged  SHx:    Reviewed / unchanged  Systems Review:  Constitutional: Denies fever, chills, wt changes,  headaches, insomnia, fatigue, night sweats, change in appetite. Eyes: Denies redness, blurred vision, diplopia, discharge, itchy, watery eyes.  ENT: Denies discharge, congestion, post nasal drip, epistaxis, sore throat, earache, hearing loss, dental pain, tinnitus, vertigo, sinus pain, snoring.  CV: Denies chest pain, palpitations, irregular heartbeat, syncope, dyspnea, diaphoresis, orthopnea, PND, claudication or edema. Respiratory: denies cough, dyspnea, DOE, pleurisy, hoarseness, laryngitis, wheezing.  Gastrointestinal: Denies dysphagia, odynophagia, heartburn, reflux, water brash, abdominal pain or cramps, nausea, vomiting, bloating, diarrhea, constipation, hematemesis, melena, hematochezia  or hemorrhoids. Genitourinary: Denies dysuria, frequency, urgency, nocturia, hesitancy, discharge, hematuria or flank pain. Musculoskeletal: Denies arthralgias, myalgias, stiffness, jt. swelling, pain, limping or strain/sprain.  Skin: Denies pruritus, rash, hives, warts, acne, eczema or change in skin lesion(s). Neuro: No weakness, tremor, incoordination, spasms, paresthesia or pain. Psychiatric: Denies confusion, memory loss or sensory loss. Endo: Denies change in weight, skin or hair change.  Heme/Lymph: No excessive bleeding, bruising or enlarged lymph nodes.  Physical Exam  BP 126/84   Pulse 88  Temp 97.5 F  Resp 16  Ht 5' 10.25"   Wt 250 lb     BMI 35.63   Appears over nourished, morbidly obese and in no distress.  Eyes: PERRLA, EOMs, conjunctiva no swelling or erythema. Sinuses: No frontal/maxillary tenderness ENT/Mouth: EAC's clear, TM's nl w/o erythema, bulging. Nares clear w/o erythema, swelling, exudates. Oropharynx clear without erythema or exudates. Oral hygiene is good. Tongue  normal, non obstructing. Hearing intact.  Neck: Supple. Thyroid nl. Car 2+/2+ without bruits, nodes or JVD. Chest: Respirations nl with BS clear & equal w/o rales, rhonchi, wheezing or stridor.  Cor: Heart  sounds normal w/ regular rate and rhythm without sig. murmurs, gallops, clicks, or rubs. Peripheral pulses normal and equal  without edema.  Abdomen: Soft & bowel sounds normal. Non-tender w/o guarding, rebound, hernias, masses, or organomegaly.  Lymphatics: Unremarkable.  Musculoskeletal: Full ROM all peripheral extremities, joint stability, 5/5 strength, and normal gait.  Skin: Warm, dry without exposed rashes, lesions or ecchymosis apparent.  Neuro: Cranial nerves intact, reflexes equal bilaterally. Sensory-motor testing grossly intact. Tendon reflexes grossly intact.  Pysch: Alert & oriented x 3.  Insight and judgement nl & appropriate. No ideations.  Assessment and Plan:  1. Essential hypertension  - TSH  2. Hyperlipidemia  - Lipid panel  3. Prediabetes  - Hemoglobin A1c - Insulin, random  4. Vitamin D deficiency  - Vit D  25 hydroxy   5. Testosterone deficiency  - Testosterone  6. OSA on CPAP   7. Medication management  - CBC with Differential/Platelet - BASIC METABOLIC PANEL WITH GFR - Hepatic function panel - Magnesium   Recommended regular exercise, BP monitoring, weight control, and discussed med and SE's. Recommended labs to assess and monitor clinical status. Further disposition pending results of labs. Over 30 minutes of exam, counseling, chart review was performed

## 2015-07-21 LAB — TESTOSTERONE: TESTOSTERONE: 941 ng/dL — AB (ref 300–890)

## 2015-07-21 LAB — VITAMIN D 25 HYDROXY (VIT D DEFICIENCY, FRACTURES): VIT D 25 HYDROXY: 35 ng/mL (ref 30–100)

## 2015-07-21 LAB — HEMOGLOBIN A1C
Hgb A1c MFr Bld: 6.1 % — ABNORMAL HIGH (ref <5.7)
MEAN PLASMA GLUCOSE: 128 mg/dL — AB (ref <117)

## 2015-07-21 LAB — TSH: TSH: 0.592 u[IU]/mL (ref 0.350–4.500)

## 2015-07-21 LAB — INSULIN, RANDOM: Insulin: 146.5 u[IU]/mL — ABNORMAL HIGH (ref 2.0–19.6)

## 2015-08-29 ENCOUNTER — Other Ambulatory Visit: Payer: Self-pay | Admitting: Internal Medicine

## 2015-08-29 NOTE — Telephone Encounter (Signed)
Rx called in 

## 2015-09-19 ENCOUNTER — Encounter: Payer: Self-pay | Admitting: Internal Medicine

## 2015-09-19 ENCOUNTER — Ambulatory Visit (INDEPENDENT_AMBULATORY_CARE_PROVIDER_SITE_OTHER): Payer: 59 | Admitting: Internal Medicine

## 2015-09-19 VITALS — BP 126/82 | HR 84 | Temp 97.7°F | Resp 16 | Ht 70.5 in | Wt 257.8 lb

## 2015-09-19 DIAGNOSIS — Z0000 Encounter for general adult medical examination without abnormal findings: Secondary | ICD-10-CM | POA: Diagnosis not present

## 2015-09-19 DIAGNOSIS — Z6836 Body mass index (BMI) 36.0-36.9, adult: Secondary | ICD-10-CM

## 2015-09-19 DIAGNOSIS — Z111 Encounter for screening for respiratory tuberculosis: Secondary | ICD-10-CM | POA: Diagnosis not present

## 2015-09-19 DIAGNOSIS — Z1212 Encounter for screening for malignant neoplasm of rectum: Secondary | ICD-10-CM

## 2015-09-19 DIAGNOSIS — I10 Essential (primary) hypertension: Secondary | ICD-10-CM | POA: Diagnosis not present

## 2015-09-19 DIAGNOSIS — Z79899 Other long term (current) drug therapy: Secondary | ICD-10-CM

## 2015-09-19 DIAGNOSIS — Z23 Encounter for immunization: Secondary | ICD-10-CM

## 2015-09-19 DIAGNOSIS — E669 Obesity, unspecified: Secondary | ICD-10-CM | POA: Insufficient documentation

## 2015-09-19 DIAGNOSIS — E349 Endocrine disorder, unspecified: Secondary | ICD-10-CM

## 2015-09-19 DIAGNOSIS — R7303 Prediabetes: Secondary | ICD-10-CM

## 2015-09-19 DIAGNOSIS — E785 Hyperlipidemia, unspecified: Secondary | ICD-10-CM

## 2015-09-19 DIAGNOSIS — R5383 Other fatigue: Secondary | ICD-10-CM

## 2015-09-19 DIAGNOSIS — G4733 Obstructive sleep apnea (adult) (pediatric): Secondary | ICD-10-CM

## 2015-09-19 DIAGNOSIS — E559 Vitamin D deficiency, unspecified: Secondary | ICD-10-CM

## 2015-09-19 DIAGNOSIS — Z125 Encounter for screening for malignant neoplasm of prostate: Secondary | ICD-10-CM

## 2015-09-19 DIAGNOSIS — Z9989 Dependence on other enabling machines and devices: Secondary | ICD-10-CM

## 2015-09-19 DIAGNOSIS — Z0001 Encounter for general adult medical examination with abnormal findings: Secondary | ICD-10-CM

## 2015-09-19 MED ORDER — PHENTERMINE HCL 37.5 MG PO TABS: ORAL_TABLET | ORAL | Status: DC

## 2015-09-19 NOTE — Patient Instructions (Signed)
Recommend Adult Low Dose Aspirin or   coated  Aspirin 81 mg daily   To reduce risk of Colon Cancer 20 %,   Skin Cancer 26 % ,   Melanoma 46%   and   Pancreatic cancer 60%   ++++++++++++++++++++++++++++++++++++++++++++++++++++++  Vitamin D goal   is between 70-100.   Please make sure that you are taking your Vitamin D as directed.   It is very important as a natural anti-inflammatory   helping hair, skin, and nails, as well as reducing stroke and heart attack risk.   It helps your bones and helps with mood.  It also decreases numerous cancer risks so please take it as directed.   Low Vit D is associated with a 200-300% higher risk for CANCER   and 200-300% higher risk for HEART   ATTACK  &  STROKE.   ......................................  It is also associated with higher death rate at younger ages,   autoimmune diseases like Rheumatoid arthritis, Lupus, Multiple Sclerosis.     Also many other serious conditions, like depression, Alzheimer's  Dementia, infertility, muscle aches, fatigue, fibromyalgia - just to name a few.  ++++++++++++++++++++++++++++++++++++++++++++++++  Recommend the book "The END of DIETING" by Dr Joel Fuhrman   & the book "The END of DIABETES " by Dr Joel Fuhrman  At [employer] - get book & Audio CD's     Being diabetic has a  300% increased risk for heart attack, stroke, cancer, and alzheimer- type vascular dementia. It is very important that you work harder with diet by avoiding all foods that are white. Avoid white rice (brown & wild rice is OK), white potatoes (sweetpotatoes in moderation is OK), White bread or wheat bread or anything made out of white flour like bagels, donuts, rolls, buns, biscuits, cakes, pastries, cookies, pizza crust, and pasta (made from white flour & egg whites) - vegetarian pasta or spinach or wheat pasta is OK. Multigrain breads like Arnold's or [employer], or multigrain sandwich thins or flatbreads.  Diet,  exercise and weight loss can reverse and cure diabetes in the early stages.  Diet, exercise and weight loss is very important in the control and prevention of complications of diabetes which affects every system in your body, ie. Brain - dementia/stroke, eyes - glaucoma/blindness, heart - heart attack/heart failure, kidneys - dialysis, stomach - gastric paralysis, intestines - malabsorption, nerves - severe painful neuritis, circulation - gangrene & loss of a leg(s), and finally cancer and Alzheimers.    I recommend avoid fried & greasy foods,  sweets/candy, white rice (brown or wild rice or Quinoa is OK), white potatoes (sweet potatoes are OK) - anything made from white flour - bagels, doughnuts, rolls, buns, biscuits,white and wheat breads, pizza crust and traditional pasta made of white flour & egg white(vegetarian pasta or spinach or wheat pasta is OK).  Multi-grain bread is OK - like multi-grain flat bread or sandwich thins. Avoid alcohol in excess. Exercise is also important.    Eat all the vegetables you want - avoid meat, especially red meat and dairy - especially cheese.  Cheese is the most concentrated form of trans-fats which is the worst thing to clog up our arteries. Veggie cheese is OK which can be found in the fresh produce section at [employer] or [employer] or [employer]  ++++++++++++++++++++++++++++++++++++++++++++++++++ DASH Eating Plan  DASH stands for "Dietary Approaches to Stop Hypertension."   The DASH eating plan is a healthy eating plan that has been shown to reduce high   blood pressure (hypertension). Additional health benefits [place] include reducing the risk of type 2 diabetes mellitus, heart disease, and stroke. The DASH eating plan Poet also help with weight loss.  WHAT DO I NEED TO KNOW ABOUT THE DASH EATING PLAN? For the DASH eating plan, you will follow these general guidelines:  Choose foods with a percent daily value for sodium of less than 5% (as listed on the food  label).  Use salt-free seasonings or herbs instead of table salt or sea salt.  Check with your health care provider or pharmacist before using salt substitutes.  Eat lower-sodium products, often labeled as "lower sodium" or "no salt added."  Eat fresh foods.  Eat more vegetables, fruits, and low-fat dairy products.    Choose whole grains. Look for the word "whole" as the first word in the ingredient list.  Choose fish   Limit sweets, desserts, sugars, and sugary drinks.  Choose heart-healthy fats.  Eat veggie cheese   Eat more home-cooked food and less restaurant, buffet, and fast food.  Limit fried foods.  Cook foods using methods other than frying.  Limit canned vegetables. If you do use them, rinse them well to decrease the sodium.  When eating at a restaurant, ask that your food be prepared with less salt, or no salt if possible.                      WHAT FOODS CAN I EAT?  Seek help from a dietitian for individual calorie needs. Grains Whole grain or whole wheat bread. Brown rice. Whole grain or whole wheat pasta. Quinoa, bulgur, and whole grain cereals. Low-sodium cereals. Corn or whole wheat flour tortillas. Whole grain cornbread. Whole grain crackers. Low-sodium crackers.  Vegetables Fresh or frozen vegetables (raw, steamed, roasted, or grilled). Low-sodium or reduced-sodium tomato and vegetable juices. Low-sodium or reduced-sodium tomato sauce and paste. Low-sodium or reduced-sodium canned vegetables.   Fruits All fresh, canned (in natural juice), or frozen fruits.  Meat and Other Protein Products  All fish and seafood.  Dried beans, peas, or lentils. Unsalted nuts and seeds. Unsalted canned beans. Dairy Low-fat dairy products, such as skim or 1% milk, 2% or reduced-fat cheeses, low-fat ricotta or cottage cheese, or plain low-fat yogurt. Low-sodium or reduced-sodium cheeses.  Fats and Oils Tub margarines without trans fats. Light or reduced-fat mayonnaise  and salad dressings (reduced sodium). Avocado. Safflower, olive, or canola oils. Natural peanut or almond butter.  Other Unsalted popcorn and pretzels. The items listed above [place] not be a complete list of recommended foods or beverages. Contact your dietitian for more options.  +++++++++++++++++++++++++++++++++++++++++++  WHAT FOODS ARE NOT RECOMMENDED?  Grains/ White flour or wheat flour  White bread. White pasta. White rice. Refined cornbread. Bagels and croissants. Crackers that contain trans fat.  Vegetables  Creamed or fried vegetables. Vegetables in a . Regular canned vegetables. Regular canned tomato sauce and paste. Regular tomato and vegetable juices.  Fruits Dried fruits. Canned fruit in light or heavy syrup. Fruit juice.  Meat and Other Protein Products Meat in general. Fatty cuts of meat. Ribs, chicken wings, bacon, sausage, bologna, salami, chitterlings, fatback, hot dogs, bratwurst, and packaged luncheon meats. Salted nuts and seeds. Canned beans with salt.  Dairy Whole or 2% milk, cream, half-and-half, and cream cheese. Whole-fat or sweetened yogurt. Full-fat cheeses or blue cheese. Nondairy creamers and whipped toppings. Processed cheese, cheese spreads, or cheese curds.  Condiments Onion and garlic salt, seasoned salt, table salt, and sea  salt. Canned and packaged gravies. Worcestershire sauce. Tartar sauce. Barbecue sauce. Teriyaki sauce. Soy sauce, including reduced sodium. Steak sauce. Fish sauce. Oyster sauce. Cocktail sauce. Horseradish. Ketchup and mustard. Meat flavorings and tenderizers. Bouillon cubes. Hot sauce. Tabasco sauce. Marinades. Taco seasonings. Relishes.  Fats and Oils Butter, stick margarine, lard, shortening, ghee, and bacon fat. Coconut, palm kernel, or palm oils. Regular salad dressings.  Pickles and olives. Salted popcorn and pretzels. The items listed above may not be a complete list of foods and beverages to avoid.   Preventive Care for  Adults  A healthy lifestyle and preventive care can promote health and wellness. Preventive health guidelines for men include the following key practices:  A routine yearly physical is a good way to check with your health care provider about your health and preventative screening. It is a chance to share any concerns and updates on your health and to receive a thorough exam.  Visit your dentist for a routine exam and preventative care every 6 months. Brush your teeth twice a day and floss once a day. Good oral hygiene prevents tooth decay and gum disease.  The frequency of eye exams is based on your age, health, family medical history, use of contact lenses, and other factors. Follow your health care provider's recommendations for frequency of eye exams.  Eat a healthy diet. Foods such as vegetables, fruits, whole grains, low-fat dairy products, and lean protein foods contain the nutrients you need without too many calories. Decrease your intake of foods high in solid fats, added sugars, and salt. Eat the right amount of calories for you.Get information about a proper diet from your health care provider, if necessary.  Regular physical exercise is one of the most important things you can do for your health. Most adults should get at least 150 minutes of moderate-intensity exercise (any activity that increases your heart rate and causes you to sweat) each week. In addition, most adults need muscle-strengthening exercises on 2 or more days a week.  Maintain a healthy weight. The body mass index (BMI) is a screening tool to identify possible weight problems. It provides an estimate of body fat based on height and weight. Your health care provider can find your BMI and can help you achieve or maintain a healthy weight.For adults 20 years and older:  A BMI below 18.5 is considered underweight.  A BMI of 18.5 to 24.9 is normal.  A BMI of 25 to 29.9 is considered overweight.  A BMI of 30 and above  is considered obese.  Maintain normal blood lipids and cholesterol levels by exercising and minimizing your intake of saturated fat. Eat a balanced diet with plenty of fruit and vegetables. Blood tests for lipids and cholesterol should begin at age 20 and be repeated every 5 years. If your lipid or cholesterol levels are high, you are over 50, or you are at high risk for heart disease, you may need your cholesterol levels checked more frequently.Ongoing high lipid and cholesterol levels should be treated with medicines if diet and exercise are not working.  If you smoke, find out from your health care provider how to quit. If you do not use tobacco, do not start.  Lung cancer screening is recommended for adults aged 55-80 years who are at high risk for developing lung cancer because of a history of smoking. A yearly low-dose CT scan of the lungs is recommended for people who have at least a 30-pack-year history of smoking and are a   current smoker or have quit within the past 15 years. A pack year of smoking is smoking an average of 1 pack of cigarettes a day for 1 year (for example: 1 pack a day for 30 years or 2 packs a day for 15 years). Yearly screening should continue until the smoker has stopped smoking for at least 15 years. Yearly screening should be stopped for people who develop a health problem that would prevent them from having lung cancer treatment.  If you choose to drink alcohol, do not have more than 2 drinks per day. One drink is considered to be 12 ounces (355 mL) of beer, 5 ounces (148 mL) of wine, or 1.5 ounces (44 mL) of liquor.  Avoid use of street drugs. Do not share needles with anyone. Ask for help if you need support or instructions about stopping the use of drugs.  High blood pressure causes heart disease and increases the risk of stroke. Your blood pressure should be checked at least every 1-2 years. Ongoing high blood pressure should be treated with medicines, if weight loss  and exercise are not effective.  If you are 76-66 years old, ask your health care provider if you should take aspirin to prevent heart disease.  Diabetes screening involves taking a blood sample to check your fasting blood sugar level. This should be done once every 3 years, after age 29, if you are within normal weight and without risk factors for diabetes. Testing should be considered at a younger age or be carried out more frequently if you are overweight and have at least 1 risk factor for diabetes.  Colorectal cancer can be detected and often prevented. Most routine colorectal cancer screening begins at the age of 53 and continues through age 66. However, your health care provider may recommend screening at an earlier age if you have risk factors for colon cancer. On a yearly basis, your health care provider may provide home test kits to check for hidden blood in the stool. Use of a small camera at the end of a tube to directly examine the colon (sigmoidoscopy or colonoscopy) can detect the earliest forms of colorectal cancer. Talk to your health care provider about this at age 45, when routine screening begins. Direct exam of the colon should be repeated every 5-10 years through age 65, unless early forms of precancerous polyps or small growths are found.   Talk with your health care provider about prostate cancer screening.  Testicular cancer screening isrecommended for adult males. Screening includes self-exam, a health care provider exam, and other screening tests. Consult with your health care provider about any symptoms you have or any concerns you have about testicular cancer.  Use sunscreen. Apply sunscreen liberally and repeatedly throughout the day. You should seek shade when your shadow is shorter than you. Protect yourself by wearing long sleeves, pants, a wide-brimmed hat, and sunglasses year round, whenever you are outdoors.  Once a month, do a whole-body skin exam, using a mirror to  look at the skin on your back. Tell your health care provider about new moles, moles that have irregular borders, moles that are larger than a pencil eraser, or moles that have changed in shape or color.  Stay current with required vaccines (immunizations).  Influenza vaccine. All adults should be immunized every year.  Tetanus, diphtheria, and acellular pertussis (Td, Tdap) vaccine. An adult who has not previously received Tdap or who does not know his vaccine status should receive 1 dose of  Tdap. This initial dose should be followed by tetanus and diphtheria toxoids (Td) booster doses every 10 years. Adults with an unknown or incomplete history of completing a 3-dose immunization series with Td-containing vaccines should begin or complete a primary immunization series including a Tdap dose. Adults should receive a Td booster every 10 years.  Varicella vaccine. An adult without evidence of immunity to varicella should receive 2 doses or a second dose if he has previously received 1 dose.  Human papillomavirus (HPV) vaccine. Males aged 37-21 years who have not received the vaccine previously should receive the 3-dose series. Males aged 22-26 years may be immunized. Immunization is recommended through the age of 24 years for any male who has sex with males and did not get any or all doses earlier. Immunization is recommended for any person with an immunocompromised condition through the age of 76 years if he did not get any or all doses earlier. During the 3-dose series, the second dose should be obtained 4-8 weeks after the first dose. The third dose should be obtained 24 weeks after the first dose and 16 weeks after the second dose.  Zoster vaccine. One dose is recommended for adults aged 88 years or older unless certain conditions are present.    PREVNAR  - Pneumococcal 13-valent conjugate (PCV13) vaccine. When indicated, a person who is uncertain of his immunization history and has no record of  immunization should receive the PCV13 vaccine. An adult aged 74 years or older who has certain medical conditions and has not been previously immunized should receive 1 dose of PCV13 vaccine. This PCV13 should be followed with a dose of pneumococcal polysaccharide (PPSV23) vaccine. The PPSV23 vaccine dose should be obtained at least 8 weeks after the dose of PCV13 vaccine. An adult aged 38 years or older who has certain medical conditions and previously received 1 or more doses of PPSV23 vaccine should receive 1 dose of PCV13. The PCV13 vaccine dose should be obtained 1 or more years after the last PPSV23 vaccine dose.    PNEUMOVAX - Pneumococcal polysaccharide (PPSV23) vaccine. When PCV13 is also indicated, PCV13 should be obtained first. All adults aged 55 years and older should be immunized. An adult younger than age 56 years who has certain medical conditions should be immunized. Any person who resides in a nursing home or long-term care facility should be immunized. An adult smoker should be immunized. People with an immunocompromised condition and certain other conditions should receive both PCV13 and PPSV23 vaccines. People with human immunodeficiency virus (HIV) infection should be immunized as soon as possible after diagnosis. Immunization during chemotherapy or radiation therapy should be avoided. Routine use of PPSV23 vaccine is not recommended for American Indians, [place] Natives, or people younger than 65 years unless there are medical conditions that require PPSV23 vaccine. When indicated, people who have unknown immunization and have no record of immunization should receive PPSV23 vaccine. One-time revaccination 5 years after the first dose of PPSV23 is recommended for people aged 19-64 years who have chronic kidney failure, nephrotic syndrome, asplenia, or immunocompromised conditions. People who received 1-2 doses of PPSV23 before age 60 years should receive another dose of PPSV23 vaccine at age  48 years or later if at least 5 years have passed since the previous dose. Doses of PPSV23 are not needed for people immunized with PPSV23 at or after age 32 years.    Hepatitis A vaccine. Adults who wish to be protected from this disease, have certain high-risk conditions,  work with hepatitis A-infected animals, work in hepatitis A research labs, or travel to or work in countries with a high rate of hepatitis A should be immunized. Adults who were previously unvaccinated and who anticipate close contact with an international adoptee during the first 60 days after arrival in the [place] from a country with a high rate of hepatitis A should be immunized.    Hepatitis B vaccine. Adults should be immunized if they wish to be protected from this disease, have certain high-risk conditions, may be exposed to blood or other infectious body fluids, are household contacts or sex partners of hepatitis B positive people, are clients or workers in certain care facilities, or travel to or work in countries with a high rate of hepatitis B.   Preventive Service / Frequency   Ages 71 to 31  Blood pressure check.  Lipid and cholesterol check  Lung cancer screening. / Every year if you are aged 24-80 years and have a 30-pack-year history of smoking and currently smoke or have quit within the past 15 years. Yearly screening is stopped once you have quit smoking for at least 15 years or develop a health problem that would prevent you from having lung cancer treatment.  Fecal occult blood test (FOBT) of stool. / Every year beginning at age 47 and continuing until age 34. You may not have to do this test if you get a colonoscopy every 10 years.  Flexible sigmoidoscopy** or colonoscopy.** / Every 5 years for a flexible sigmoidoscopy or every 10 years for a colonoscopy beginning at age 67 and continuing until age 9. Screening for abdominal aortic aneurysm (AAA)  by ultrasound is recommended for people who  have history of high blood pressure or who are current or former smokers.

## 2015-09-20 LAB — HEPATIC FUNCTION PANEL
ALK PHOS: 42 U/L (ref 40–115)
ALT: 49 U/L — AB (ref 9–46)
AST: 47 U/L — ABNORMAL HIGH (ref 10–40)
Albumin: 4.6 g/dL (ref 3.6–5.1)
BILIRUBIN DIRECT: 0.1 mg/dL (ref <=0.2)
BILIRUBIN INDIRECT: 0.6 mg/dL (ref 0.2–1.2)
TOTAL PROTEIN: 6.8 g/dL (ref 6.1–8.1)
Total Bilirubin: 0.7 mg/dL (ref 0.2–1.2)

## 2015-09-20 LAB — MICROALBUMIN / CREATININE URINE RATIO
CREATININE, URINE: 33.4 mg/dL
Microalb, Ur: <0.2 mg/dL (ref <2.0)

## 2015-09-20 LAB — CBC WITH DIFFERENTIAL/PLATELET
BASOS ABS: 0 10*3/uL (ref 0.0–0.1)
BASOS PCT: 1 % (ref 0–1)
Eosinophils Absolute: 0.2 10*3/uL (ref 0.0–0.7)
Eosinophils Relative: 4 % (ref 0–5)
HEMATOCRIT: 48.4 % (ref 39.0–52.0)
HEMOGLOBIN: 16.9 g/dL (ref 13.0–17.0)
LYMPHS PCT: 48 % — AB (ref 12–46)
Lymphs Abs: 2.1 10*3/uL (ref 0.7–4.0)
MCH: 26.5 pg (ref 26.0–34.0)
MCHC: 34.9 g/dL (ref 30.0–36.0)
MCV: 75.9 fL — ABNORMAL LOW (ref 78.0–100.0)
MPV: 9.7 fL (ref 8.6–12.4)
Monocytes Absolute: 0.3 10*3/uL (ref 0.1–1.0)
Monocytes Relative: 7 % (ref 3–12)
NEUTROS ABS: 1.7 10*3/uL (ref 1.7–7.7)
NEUTROS PCT: 40 % — AB (ref 43–77)
Platelets: 237 10*3/uL (ref 150–400)
RBC: 6.38 MIL/uL — AB (ref 4.22–5.81)
RDW: 13.7 % (ref 11.5–15.5)
WBC: 4.3 10*3/uL (ref 4.0–10.5)

## 2015-09-20 LAB — TESTOSTERONE: Testosterone: 148 ng/dL — ABNORMAL LOW (ref 300–890)

## 2015-09-20 LAB — BASIC METABOLIC PANEL WITH GFR
BUN: 9 mg/dL (ref 7–25)
CALCIUM: 9.4 mg/dL (ref 8.6–10.3)
CO2: 25 mmol/L (ref 20–31)
Chloride: 104 mmol/L (ref 98–110)
Creat: 1.16 mg/dL (ref 0.60–1.35)
GFR, EST NON AFRICAN AMERICAN: 77 mL/min (ref >=60)
GFR, Est African American: 89 mL/min (ref >=60)
GLUCOSE: 108 mg/dL — AB (ref 65–99)
POTASSIUM: 4.3 mmol/L (ref 3.5–5.3)
SODIUM: 139 mmol/L (ref 135–146)

## 2015-09-20 LAB — URINALYSIS, ROUTINE W REFLEX MICROSCOPIC
BILIRUBIN URINE: NEGATIVE
Glucose, UA: NEGATIVE
Hgb urine dipstick: NEGATIVE
Ketones, ur: NEGATIVE
LEUKOCYTES UA: NEGATIVE
Nitrite: NEGATIVE
PROTEIN: NEGATIVE
SPECIFIC GRAVITY, URINE: 1.005 (ref 1.001–1.035)
pH: 6.5 (ref 5.0–8.0)

## 2015-09-20 LAB — IRON AND TIBC
%SAT: 44 % (ref 15–60)
IRON: 125 ug/dL (ref 50–180)
TIBC: 285 ug/dL (ref 250–425)
UIBC: 160 ug/dL (ref 125–400)

## 2015-09-20 LAB — INSULIN, RANDOM: Insulin: 114.5 u[IU]/mL — ABNORMAL HIGH (ref 2.0–19.6)

## 2015-09-20 LAB — MAGNESIUM: Magnesium: 1.9 mg/dL (ref 1.5–2.5)

## 2015-09-20 LAB — TSH: TSH: 0.55 u[IU]/mL (ref 0.350–4.500)

## 2015-09-20 LAB — HEMOGLOBIN A1C
HEMOGLOBIN A1C: 6.1 % — AB (ref <5.7)
MEAN PLASMA GLUCOSE: 128 mg/dL — AB (ref <117)

## 2015-09-20 LAB — LIPID PANEL
CHOL/HDL RATIO: 5.8 ratio — AB (ref <=5.0)
CHOLESTEROL: 144 mg/dL (ref 125–200)
HDL: 25 mg/dL — ABNORMAL LOW (ref >=40)
LDL CALC: 85 mg/dL (ref <130)
TRIGLYCERIDES: 170 mg/dL — AB (ref <150)
VLDL: 34 mg/dL — AB (ref <30)

## 2015-09-20 LAB — PSA: PSA: 0.49 ng/mL (ref <=4.00)

## 2015-09-20 LAB — VITAMIN B12: VITAMIN B 12: 529 pg/mL (ref 211–911)

## 2015-09-20 LAB — VITAMIN D 25 HYDROXY (VIT D DEFICIENCY, FRACTURES): Vit D, 25-Hydroxy: 57 ng/mL (ref 30–100)

## 2015-09-24 ENCOUNTER — Encounter: Payer: Self-pay | Admitting: Internal Medicine

## 2015-09-24 NOTE — Progress Notes (Signed)
Patient ID: Mike Cohen, male   DOB: 1971/05/19, 44 y.o.   MRN: [id number]   Annual Wellness/Preventative Visit And Comprehensive Evaluation,  Examination & Management  This very nice 44 y.o. MBM presents for complete physical.  Patient has been followed for HTN, Morbid Obesity, Prediabetes, Hyperlipidemia,  Testosterone Deficiency and Vitamin D Deficiency. Patient also has OSA and has been on CPAP since 2013 with improved sense of energy and better sleep hygiene.    Patient has hx/o Labile HTN circa 2007 and has been monitored expectantly. Patient's BP has been controlled and today's BP: 126/82 mmHg. Patient denies any cardiac symptoms as chest pain, palpitations, shortness of breath, dizziness or ankle swelling.   Patient's hyperlipidemia is controlled with diet and medications. Patient denies myalgias or other medication SE's. Last lipids were at goal with Cholesterol 144; HDL 25*; LDL 85; & sl elevated Triglycerides 170 on 09/19/2015.   Patient has prediabetes since Aug 2012 with A1c 6.5% and then down to 6.1% in Dec 2012  and patient denies reactive hypoglycemic symptoms, visual blurring, diabetic polys or paresthesias. Today's A1c is elevated at 6.1%.   Patient has hx/o Low T since July 2010 of 252 and 117.65 in Aug 2012 and has been on Parenteral replacement with improved sense of well-being. Finally, patient has history of Vitamin D Deficiency of "9" in 2009 and today's vitamin D was 57.      Medication Sig  . Cholecalciferol (VITAMIN D PO) Take 2,000 Int'l Units by mouth daily.  Marland Kitchen DEPO-TESTOSTERONE  200 MG/ML inj INJECT 2 ML  INTRAMUSCULARLY EVERY 2 WEEKS  . tadalafil (CIALIS) 20 MG tablet Take 1/2 to 1 tablet every 2 to 3 days as needed for XXXX   No Known Allergies   Past Medical History  Diagnosis Date  . Hypertension   . Prediabetes   . Other testicular hypofunction   . Hyperlipidemia    Health Maintenance  Topic Date Due  . HIV Screening  09/26/1986  . INFLUENZA  VACCINE  07/10/2016  . TETANUS/TDAP  09/18/2025   Immunization History  Administered Date(s) Administered  . Influenza Split 09/22/2014, 09/19/2015  . PPD Test 09/19/2015  . Td 10/13/2002  . Tdap 09/19/2015   Past Surgical History  Procedure Laterality Date  . Shoulder arthroscopy Right 2000  . Rotator cuff repair Right 2007   Family History  Problem Relation Age of Onset  . Heart disease Father   . Hyperlipidemia Father   . Hypertension Father    Social History   Social History  . Marital Status: Married    Spouse Name: N/A  . Number of Children: N/A  . Years of Education: N/A   Occupational History  . Not on file.   Social History Main Topics  . Smoking status: Never Smoker   . Smokeless tobacco: Not on file     Comment: occasional cigar  . Alcohol Use: 0.0 oz/week    0 Standard drinks or equivalent per week     Comment: occasional  . Drug Use: No  . Sexual Activity: Not on file   Other Topics Concern  . Not on file   Social History Narrative    ROS Constitutional: Denies fever, chills, weight loss/gain, headaches, insomnia,  night sweats or change in appetite. Does c/o fatigue. Eyes: Denies redness, blurred vision, diplopia, discharge, itchy or watery eyes.  ENT: Denies discharge, congestion, post nasal drip, epistaxis, sore throat, earache, hearing loss, dental pain, Tinnitus, Vertigo, Sinus pain or snoring.  Cardio: Denies chest  pain, palpitations, irregular heartbeat, syncope, dyspnea, diaphoresis, orthopnea, PND, claudication or edema Respiratory: denies cough, dyspnea, DOE, pleurisy, hoarseness, laryngitis or wheezing.  Gastrointestinal: Denies dysphagia, heartburn, reflux, water brash, pain, cramps, nausea, vomiting, bloating, diarrhea, constipation, hematemesis, melena, hematochezia, jaundice or hemorrhoids Genitourinary: Denies dysuria, frequency, urgency, nocturia, hesitancy, discharge, hematuria or flank pain Musculoskeletal: Denies arthralgia,  myalgia, stiffness, Jt. Swelling, pain, limp or strain/sprain. Denies Falls. Skin: Denies puritis, rash, hives, warts, acne, eczema or change in skin lesion Neuro: No weakness, tremor, incoordination, spasms, paresthesia or pain Psychiatric: Denies confusion, memory loss or sensory loss. Denies Depression. Endocrine: Denies change in weight, skin, hair change, nocturia, and paresthesia, diabetic polys, visual blurring or hyper / hypo glycemic episodes.  Heme/Lymph: No excessive bleeding, bruising or enlarged lymph nodes.  Physical Exam  BP 126/82 mmHg  Pulse 84  Temp(Src) 97.7 F (36.5 C)  Resp 16  Ht 5' 10.5" (1.791 m)  Wt 257 lb 12.8 oz (116.937 kg)  BMI 36.46 kg/m2  General Appearance: Well nourished &  in no apparent distress. Eyes: PERRLA, EOMs, conjunctiva no swelling or erythema, normal fundi and vessels. Sinuses: No frontal/maxillary tenderness ENT/Mouth: EACs patent / TMs  nl. Nares clear without erythema, swelling, mucoid exudates. Oral hygiene is good. No erythema, swelling, or exudate. Tongue normal, non-obstructing. Tonsils not swollen or erythematous. Hearing normal.  Neck: Supple, thyroid normal. No bruits, nodes or JVD. Respiratory: Respiratory effort normal.  BS equal and clear bilateral without rales, rhonci, wheezing or stridor. Cardio: Heart sounds are normal with regular rate and rhythm and no murmurs, rubs or gallops. Peripheral pulses are normal and equal bilaterally without edema. No aortic or femoral bruits. Chest: symmetric with normal excursions and percussion.  Abdomen: Flat, soft, with bowel sounds. Nontender, no guarding, rebound, hernias, masses, or organomegaly.  Lymphatics: Non tender without lymphadenopathy.  Genitourinary: No hernias.Testes nl. DRE - prostate nl for age - smooth & firm w/o nodules. Musculoskeletal: Full ROM all peripheral extremities, joint stability, 5/5 strength, and normal gait. Skin: Warm and dry without rashes, lesions, cyanosis,  clubbing or  ecchymosis.  Neuro: Cranial nerves intact, reflexes equal bilaterally. Normal muscle tone, no cerebellar symptoms. Sensation intact.  Pysch: Alert and oriented X 3 with normal affect, insight and judgment appropriate.   Assessment and Plan  1. Encounter for general adult medical examination with abnormal findings   2. Essential hypertension  - Microalbumin / creatinine urine ratio - EKG 12-Lead - TSH  3. Hyperlipidemia  - Lipid panel  4. Prediabetes  - Hemoglobin A1c - Insulin, random  - Dietary counseling recc Dr Lear Ng book: The End Of Dieting & The End of Diabetes  5. Vitamin D deficiency  - Vit D  25 hydroxy   6. Testosterone deficiency  - Testosterone  7. OSA on CPAP   8. Screening for rectal cancer  - POC Hemoccult Bld/Stl   9. Prostate cancer screening  - PSA  10. Other fatigue  - Vitamin B12 - Testosterone - Iron and TIBC - TSH  11. BMI 36.46,  adult   12. Medication management  - Urinalysis, Routine w reflex microscopic  - CBC with Differential/Platelet - BASIC METABOLIC PANEL WITH GFR - Hepatic function panel - Magnesium  13. Screening examination for pulmonary tuberculosis  - PPD  14. Need for prophylactic vaccination and inoculation against influenza  - Flu vaccine > 3yo with preservative IM (Fluvirin Influenza Split)  15. Need for prophylactic vaccination with combined diphtheria-tetanus-pertussis (DTP) vaccine  - Tdap vaccine greater than  or equal to 7yo IM   Continue prudent diet as discussed, weight control, BP monitoring, regular exercise, and medications as discussed.  Discussed med effects and SE's. Routine screening labs and tests as requested with regular follow-up as recommended.  Over 40 minutes of exam, counseling &  chart review was performed

## 2015-09-25 ENCOUNTER — Encounter: Payer: Self-pay | Admitting: Internal Medicine

## 2015-09-27 LAB — TB SKIN TEST
INDURATION: 0 mm
TB SKIN TEST: NEGATIVE

## 2016-03-21 ENCOUNTER — Ambulatory Visit (INDEPENDENT_AMBULATORY_CARE_PROVIDER_SITE_OTHER): Payer: 59 | Admitting: Internal Medicine

## 2016-03-21 VITALS — BP 118/78 | HR 78 | Temp 98.2°F | Resp 16 | Wt 264.0 lb

## 2016-03-21 DIAGNOSIS — R7303 Prediabetes: Secondary | ICD-10-CM | POA: Diagnosis not present

## 2016-03-21 DIAGNOSIS — E559 Vitamin D deficiency, unspecified: Secondary | ICD-10-CM

## 2016-03-21 DIAGNOSIS — E785 Hyperlipidemia, unspecified: Secondary | ICD-10-CM | POA: Diagnosis not present

## 2016-03-21 DIAGNOSIS — Z79899 Other long term (current) drug therapy: Secondary | ICD-10-CM

## 2016-03-21 DIAGNOSIS — I10 Essential (primary) hypertension: Secondary | ICD-10-CM | POA: Diagnosis not present

## 2016-03-21 LAB — CBC WITH DIFFERENTIAL/PLATELET
BASOS ABS: 0 {cells}/uL (ref 0–200)
Basophils Relative: 0 %
EOS PCT: 3 %
Eosinophils Absolute: 153 cells/uL (ref 15–500)
HCT: 47.7 % (ref 38.5–50.0)
Hemoglobin: 16.5 g/dL (ref 13.2–17.1)
LYMPHS PCT: 36 %
Lymphs Abs: 1836 cells/uL (ref 850–3900)
MCH: 27.5 pg (ref 27.0–33.0)
MCHC: 34.6 g/dL (ref 32.0–36.0)
MCV: 79.5 fL — AB (ref 80.0–100.0)
MONOS PCT: 6 %
MPV: 9.3 fL (ref 7.5–12.5)
Monocytes Absolute: 306 cells/uL (ref 200–950)
NEUTROS ABS: 2805 {cells}/uL (ref 1500–7800)
Neutrophils Relative %: 55 %
PLATELETS: 207 10*3/uL (ref 140–400)
RBC: 6 MIL/uL — ABNORMAL HIGH (ref 4.20–5.80)
RDW: 14.8 % (ref 11.0–15.0)
WBC: 5.1 10*3/uL (ref 3.8–10.8)

## 2016-03-21 LAB — BASIC METABOLIC PANEL WITH GFR
BUN: 11 mg/dL (ref 7–25)
CALCIUM: 9.5 mg/dL (ref 8.6–10.3)
CHLORIDE: 104 mmol/L (ref 98–110)
CO2: 27 mmol/L (ref 20–31)
CREATININE: 1 mg/dL (ref 0.60–1.35)
GFR, Est Non African American: >89 mL/min (ref >=60)
Glucose, Bld: 86 mg/dL (ref 65–99)
Potassium: 4.3 mmol/L (ref 3.5–5.3)
SODIUM: 138 mmol/L (ref 135–146)

## 2016-03-21 LAB — HEMOGLOBIN A1C
HEMOGLOBIN A1C: 6.2 % — AB (ref <5.7)
MEAN PLASMA GLUCOSE: 131 mg/dL

## 2016-03-21 LAB — HEPATIC FUNCTION PANEL
ALBUMIN: 4.4 g/dL (ref 3.6–5.1)
ALT: 41 U/L (ref 9–46)
AST: 40 U/L (ref 10–40)
Alkaline Phosphatase: 46 U/L (ref 40–115)
BILIRUBIN DIRECT: 0.1 mg/dL (ref <=0.2)
BILIRUBIN TOTAL: 0.5 mg/dL (ref 0.2–1.2)
Indirect Bilirubin: 0.4 mg/dL (ref 0.2–1.2)
Total Protein: 6.6 g/dL (ref 6.1–8.1)

## 2016-03-21 LAB — LIPID PANEL
CHOL/HDL RATIO: 6.7 ratio — AB (ref <=5.0)
CHOLESTEROL: 153 mg/dL (ref 125–200)
HDL: 23 mg/dL — ABNORMAL LOW (ref >=40)
LDL Cholesterol: 67 mg/dL (ref <130)
Triglycerides: 316 mg/dL — ABNORMAL HIGH (ref <150)
VLDL: 63 mg/dL — ABNORMAL HIGH (ref <30)

## 2016-03-21 MED ORDER — MELOXICAM 15 MG PO TABS: 15.0000 mg | ORAL_TABLET | Freq: Every day | ORAL | Status: DC

## 2016-03-21 NOTE — Progress Notes (Signed)
Assessment and Plan:  Hypertension:  -Continue medication,  -monitor blood pressure at home.  -Continue DASH diet.   -Reminder to go to the ER if any CP, SOB, nausea, dizziness, severe HA, changes vision/speech, left arm numbness and tingling, and jaw pain.  Cholesterol: -Continue diet and exercise.  -Check cholesterol.   Pre-diabetes: -Continue diet and exercise.  -Check A1C  Vitamin D Def: -check level -continue medications.   Obesity -cont phentermine -diet and exercise changes recommended -weight today does reflect that he is currently wearing his utility belt for work including his sidearm  Continue diet and meds as discussed. Further disposition pending results of labs.  HPI 45 y.o. male  presents for 3 month follow up with hypertension, hyperlipidemia, prediabetes and vitamin D.   His blood pressure has been controlled at home, today their BP is  .   He does workout. He denies chest pain, shortness of breath, dizziness.   He is not on cholesterol medication and denies myalgias. His cholesterol is at goal. The cholesterol last visit was:   Lab Results  Component Value Date   CHOL 144 09/19/2015   HDL 25* 09/19/2015   LDLCALC 85 09/19/2015   TRIG 170* 09/19/2015   CHOLHDL 5.8* 09/19/2015     He has been working on diet and exercise for prediabetes, and denies foot ulcerations, hyperglycemia, hypoglycemia , increased appetite, nausea, paresthesia of the feet, polydipsia, polyuria, visual disturbances, vomiting and weight loss. Last A1C in the office was:  Lab Results  Component Value Date   HGBA1C 6.1* 09/19/2015    Patient is on Vitamin D supplement.  Lab Results  Component Value Date   VD25OH 67 09/19/2015     He did go to see a back orthopedist due to low back pain.  He reports that he has been taking prednisone.  He did see Dr. Rhona Raider.  He reports that he is better than he was doing, but is not healed.  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  He is getting ready to go to a [employer] in [place] for the next month.    He is still taking phentermine.  He believes that it is working despite his weight today.  He was weighed with his utility belt on.    Current Medications:  Current Outpatient Prescriptions on File Prior to Visit  Medication Sig Dispense Refill  . Cholecalciferol (VITAMIN D PO) Take 2,000 Int'l Units by mouth daily.    . phentermine (ADIPEX-P) 37.5 MG tablet Take 1/2 to 1 tablet every morning for dieting & weightloss 30 tablet 5  . sildenafil (REVATIO) 20 MG tablet     . SYRINGE-NEEDLE, DISP, 3 ML (B-D 3CC LUER-LOK SYR 21GX1-1/2) 21G X 1-1/2" 3 ML MISC Test sugar once daily 50 each 0  . tadalafil (CIALIS) 20 MG tablet Take 1/2 to 1 tablet every 2 to 3 days as needed for XXXX 10 tablet 99  . testosterone cypionate (DEPOTESTOSTERONE CYPIONATE) 200 MG/ML injection INJECT 2 ML  INTRAMUSCULARLY EVERY 2 WEEKS 10 mL 2   No current facility-administered medications on file prior to visit.    Medical History:  Past Medical History  Diagnosis Date  . Hypertension   . Prediabetes   . Other testicular hypofunction   . Hyperlipidemia     Allergies: No Known Allergies   Review of Systems:  Review of  Systems  Constitutional: Negative for fever, chills and malaise/fatigue.  HENT: Negative for congestion, ear pain and sore throat.   Eyes: Negative.   Respiratory: Negative for cough, shortness of breath and wheezing.   Cardiovascular: Negative for chest pain, palpitations and leg swelling.  Gastrointestinal: Negative for heartburn, abdominal pain, diarrhea, constipation and blood in stool.  Genitourinary: Negative.   Skin: Negative.   Neurological: Negative for dizziness, sensory change, loss of consciousness and headaches.  Psychiatric/Behavioral: Negative for depression. The patient is not nervous/anxious and does  not have insomnia.     Family history- Review and unchanged  Social history- Review and unchanged  Physical Exam: There were no vitals taken for this visit. Wt Readings from Last 3 Encounters:  09/19/15 257 lb 12.8 oz (116.937 kg)  07/20/15 250 lb (113.399 kg)  09/22/14 256 lb 6.4 oz (116.302 kg)    General Appearance: Well nourished well developed, in no apparent distress. Eyes: PERRLA, EOMs, conjunctiva no swelling or erythema ENT/Mouth: Ear canals normal without obstruction, swelling, erythma, discharge.  TMs normal bilaterally.  Oropharynx moist, clear, without exudate, or postoropharyngeal swelling. Neck: Supple, thyroid normal,no cervical adenopathy  Respiratory: Respiratory effort normal, Breath sounds clear A&P without rhonchi, wheeze, or rale.  No retractions, no accessory usage. Cardio: RRR with no MRGs. Brisk peripheral pulses without edema.  Abdomen: Soft, + BS,  Non tender, no guarding, rebound, hernias, masses. Musculoskeletal: Full ROM, 5/5 strength, Normal gait Skin: Warm, dry without rashes, lesions, ecchymosis.  Neuro: Awake and oriented X 3, Cranial nerves intact. Normal muscle tone, no cerebellar symptoms. Psych: Normal affect, Insight and Judgment appropriate.    Mike Skeans, PA-C 3:20 PM [facility]

## 2016-03-22 LAB — TSH: TSH: 0.49 m[IU]/L (ref 0.40–4.50)

## 2016-06-26 ENCOUNTER — Ambulatory Visit (INDEPENDENT_AMBULATORY_CARE_PROVIDER_SITE_OTHER): Payer: 59 | Admitting: Internal Medicine

## 2016-06-26 ENCOUNTER — Encounter: Payer: Self-pay | Admitting: Internal Medicine

## 2016-06-26 VITALS — BP 128/82 | HR 88 | Temp 97.3°F | Resp 16 | Ht 70.5 in | Wt 257.2 lb

## 2016-06-26 DIAGNOSIS — G4733 Obstructive sleep apnea (adult) (pediatric): Secondary | ICD-10-CM | POA: Diagnosis not present

## 2016-06-26 DIAGNOSIS — E559 Vitamin D deficiency, unspecified: Secondary | ICD-10-CM

## 2016-06-26 DIAGNOSIS — E349 Endocrine disorder, unspecified: Secondary | ICD-10-CM

## 2016-06-26 DIAGNOSIS — E291 Testicular hypofunction: Secondary | ICD-10-CM | POA: Diagnosis not present

## 2016-06-26 DIAGNOSIS — R7303 Prediabetes: Secondary | ICD-10-CM

## 2016-06-26 DIAGNOSIS — Z79899 Other long term (current) drug therapy: Secondary | ICD-10-CM | POA: Diagnosis not present

## 2016-06-26 DIAGNOSIS — E785 Hyperlipidemia, unspecified: Secondary | ICD-10-CM

## 2016-06-26 DIAGNOSIS — Z9989 Dependence on other enabling machines and devices: Secondary | ICD-10-CM

## 2016-06-26 DIAGNOSIS — I10 Essential (primary) hypertension: Secondary | ICD-10-CM | POA: Diagnosis not present

## 2016-06-26 LAB — CBC WITH DIFFERENTIAL/PLATELET
BASOS PCT: 0 %
Basophils Absolute: 0 cells/uL (ref 0–200)
EOS ABS: 162 {cells}/uL (ref 15–500)
Eosinophils Relative: 3 %
HEMATOCRIT: 48.7 % (ref 38.5–50.0)
Hemoglobin: 16.4 g/dL (ref 13.2–17.1)
LYMPHS PCT: 36 %
Lymphs Abs: 1944 cells/uL (ref 850–3900)
MCH: 27 pg (ref 27.0–33.0)
MCHC: 33.7 g/dL (ref 32.0–36.0)
MCV: 80.1 fL (ref 80.0–100.0)
MONO ABS: 432 {cells}/uL (ref 200–950)
MONOS PCT: 8 %
MPV: 9.7 fL (ref 7.5–12.5)
NEUTROS ABS: 2862 {cells}/uL (ref 1500–7800)
NEUTROS PCT: 53 %
PLATELETS: 229 10*3/uL (ref 140–400)
RBC: 6.08 MIL/uL — ABNORMAL HIGH (ref 4.20–5.80)
RDW: 15.9 % — AB (ref 11.0–15.0)
WBC: 5.4 10*3/uL (ref 3.8–10.8)

## 2016-06-26 MED ORDER — TADALAFIL 20 MG PO TABS
ORAL_TABLET | ORAL | Status: DC
Start: 2016-06-26 — End: 2017-08-04

## 2016-06-26 NOTE — Progress Notes (Signed)
Patient ID: Mike Cohen, male   DOB: 01-Nov-1971, 45 y.o.   MRN: [id number]  [facility]                       Unk Pinto, M.D.        Uvaldo Bristle. Silverio Lay, P.A.-C       Starlyn Skeans, P.A.-C   [facility]                [address] [place], [place] [id number] Telephone [phone] Telefax [phone] ______________________________________________________________________     This very nice 45 y.o. MBM presents for 6 month follow up with Hypertension, Hyperlipidemia, Pre-Diabetes and Vitamin D Deficiency.      Patient is followed expectantly for labile HTN & BP has been controlled at home. Today's BP is  128/82 mmHg. Patient has had no complaints of any cardiac type chest pain, palpitations, dyspnea/orthopnea/PND, dizziness, claudication, or dependent edema.     Hyperlipidemia is controlled with diet & meds. Patient denies myalgias or other med SE's. Last Lipids were at goal with Cholesterol 153; HDL 23*; LDL 67; and elevated Triglycerides 316 on 03/21/2016.     Also, the patient has history of T2_NIDDM with A1c 6.5% in 07/2011 and then 6.1% in Dec 2012 and he is attempting to manage this with diet  and has had no symptoms of reactive hypoglycemia, diabetic polys, paresthesias or visual blurring.  Last A1c was  6.2% on 03/21/2016.      Patient has hx/o low T 117 in 2012 and has been on Depo-Testosterone with improved energy and sense of well being. Further, the patient also has history of Vitamin D Deficiency of "9" in 2009 and supplements vitamin D without any suspected side-effects. Last vitamin D was 57 on 09/19/2015.  Medication Sig  . VITAMIN D 5000 Take 5,000  Units  X 4 caps = 20,000 u/day  . meloxicam15 MG tablet Take 1 tablet (15 mg total) by mouth daily.  . phentermine  37.5 MG tablet Take 1/2 to 1 tablet every morning for dieting & weightloss  . sildenafil  20 MG tablet   . tadalafil  20  MG tablet Take 1/2 to 1 tablet every 2 to 3 days as needed   . testosterone cypionate  200 MG/ML inj INJECT 2 ML  IM EVERY 2 WKS   No Known Allergies  PMHx:   Past Medical History  Diagnosis Date  . Hypertension   . Prediabetes   . Other testicular hypofunction   . Hyperlipidemia    Immunization History  Administered Date(s) Administered  . Influenza Split 09/22/2014, 09/19/2015  . PPD Test 09/19/2015  . Td 10/13/2002  . Tdap 09/19/2015   Past Surgical History  Procedure Laterality Date  . Shoulder arthroscopy Right 2000  . Rotator cuff repair Right 2007   FHx:    Reviewed / unchanged  SHx:    Reviewed / unchanged  Systems Review:  Constitutional: Denies fever, chills, wt changes, headaches, insomnia, fatigue, night sweats, change in appetite. Eyes: Denies redness, blurred vision, diplopia, discharge, itchy, watery eyes.  ENT: Denies discharge, congestion, post nasal drip, epistaxis, sore throat, earache, hearing loss, dental pain, tinnitus, vertigo, sinus pain, snoring.  CV: Denies chest pain, palpitations, irregular heartbeat, syncope, dyspnea, diaphoresis, orthopnea, PND, claudication or edema. Respiratory: denies cough, dyspnea, DOE, pleurisy, hoarseness, laryngitis, wheezing.  Gastrointestinal: Denies  dysphagia, odynophagia, heartburn, reflux, water brash, abdominal pain or cramps, nausea, vomiting, bloating, diarrhea, constipation, hematemesis, melena, hematochezia  or hemorrhoids. Genitourinary: Denies dysuria, frequency, urgency, nocturia, hesitancy, discharge, hematuria or flank pain. Musculoskeletal: Denies arthralgias, myalgias, stiffness, jt. swelling, pain, limping or strain/sprain.  Skin: Denies pruritus, rash, hives, warts, acne, eczema or change in skin lesion(s). Neuro: No weakness, tremor, incoordination, spasms, paresthesia or pain. Psychiatric: Denies confusion, memory loss or sensory loss. Endo: Denies change in weight, skin or hair change.  Heme/Lymph:  No excessive bleeding, bruising or enlarged lymph nodes.  Physical Exam  BP 128/82 mmHg  Pulse 88  Temp(Src) 97.3 F (36.3 C)  Resp 16  Ht 5' 10.5" (1.791 m)  Wt 257 lb 3.2 oz (116.665 kg)  BMI 36.37 kg/m2  Appears well nourished and in no distress. Eyes: PERRLA, EOMs, conjunctiva no swelling or erythema. Sinuses: No frontal/maxillary tenderness ENT/Mouth: EAC's clear, TM's nl w/o erythema, bulging. Nares clear w/o erythema, swelling, exudates. Oropharynx clear without erythema or exudates. Oral hygiene is good. Tongue normal, non obstructing. Hearing intact.  Neck: Supple. Thyroid nl. Car 2+/2+ without bruits, nodes or JVD. Chest: Respirations nl with BS clear & equal w/o rales, rhonchi, wheezing or stridor.  Cor: Heart sounds normal w/ regular rate and rhythm without sig. murmurs, gallops, clicks, or rubs. Peripheral pulses normal and equal  without edema.  Abdomen: Soft & bowel sounds normal. Non-tender w/o guarding, rebound, hernias, masses, or organomegaly.  Lymphatics: Unremarkable.  Musculoskeletal: Full ROM all peripheral extremities, joint stability, 5/5 strength, and normal gait.  Skin: Warm, dry without exposed rashes, lesions or ecchymosis apparent.  Neuro: Cranial nerves intact, reflexes equal bilaterally. Sensory-motor testing grossly intact. Tendon reflexes grossly intact.  Pysch: Alert & oriented x 3.  Insight and judgement nl & appropriate. No ideations.  Assessment and Plan:  1. Essential hypertension  - Continue medication, monitor blood pressure at home. Continue DASH diet. Reminder to go to the ER if any CP, SOB, nausea, dizziness, severe HA, changes vision/speech, left arm numbness and tingling and jaw pain. - TSH  2. Hyperlipidemia  - Continue diet/meds, exercise,& lifestyle modifications. Continue monitor periodic cholesterol/liver & renal functions  - Lipid panel - TSH  3. Prediabetes  - Continue diet, exercise, lifestyle modifications. Monitor  appropriate labs. - Hemoglobin A1c - Insulin, random  4. Vitamin D deficiency  - Continue supplementation. - VITAMIN D 25 Hydroxy   5. Testosterone deficiency  - Testosterone  6. OSA on CPAP   7. Medication management  - CBC with Differential/Platelet - BASIC METABOLIC PANEL WITH GFR - Hepatic function panel - Magnesium   Recommended regular exercise, BP monitoring, weight control, and discussed med and SE's. Recommended labs to assess and monitor clinical status. Further disposition pending results of labs. Over 30 minutes of exam, counseling, chart review was performed

## 2016-06-26 NOTE — Patient Instructions (Signed)

## 2016-06-27 ENCOUNTER — Other Ambulatory Visit: Payer: Self-pay | Admitting: *Deleted

## 2016-06-27 LAB — VITAMIN D 25 HYDROXY (VIT D DEFICIENCY, FRACTURES): Vit D, 25-Hydroxy: 60 ng/mL (ref 30–100)

## 2016-06-27 LAB — BASIC METABOLIC PANEL WITH GFR
BUN: 16 mg/dL (ref 7–25)
CALCIUM: 9.4 mg/dL (ref 8.6–10.3)
CO2: 25 mmol/L (ref 20–31)
CREATININE: 1.36 mg/dL — AB (ref 0.60–1.35)
Chloride: 104 mmol/L (ref 98–110)
GFR, EST AFRICAN AMERICAN: 73 mL/min (ref >=60)
GFR, Est Non African American: 63 mL/min (ref >=60)
GLUCOSE: 81 mg/dL (ref 65–99)
Potassium: 4.1 mmol/L (ref 3.5–5.3)
Sodium: 138 mmol/L (ref 135–146)

## 2016-06-27 LAB — TSH: TSH: 0.66 mIU/L (ref 0.40–4.50)

## 2016-06-27 LAB — MAGNESIUM: MAGNESIUM: 2 mg/dL (ref 1.5–2.5)

## 2016-06-27 LAB — INSULIN, RANDOM: Insulin: 8 u[IU]/mL (ref 2.0–19.6)

## 2016-06-27 LAB — HEPATIC FUNCTION PANEL
ALBUMIN: 4.5 g/dL (ref 3.6–5.1)
ALK PHOS: 37 U/L — AB (ref 40–115)
ALT: 48 U/L — ABNORMAL HIGH (ref 9–46)
AST: 55 U/L — ABNORMAL HIGH (ref 10–40)
BILIRUBIN TOTAL: 0.8 mg/dL (ref 0.2–1.2)
Bilirubin, Direct: 0.2 mg/dL (ref <=0.2)
Indirect Bilirubin: 0.6 mg/dL (ref 0.2–1.2)
TOTAL PROTEIN: 7 g/dL (ref 6.1–8.1)

## 2016-06-27 LAB — LIPID PANEL
CHOLESTEROL: 140 mg/dL (ref 125–200)
HDL: 25 mg/dL — AB (ref >=40)
LDL CALC: 66 mg/dL (ref <130)
TRIGLYCERIDES: 244 mg/dL — AB (ref <150)
Total CHOL/HDL Ratio: 5.6 Ratio — ABNORMAL HIGH (ref <=5.0)
VLDL: 49 mg/dL — ABNORMAL HIGH (ref <30)

## 2016-06-27 LAB — TESTOSTERONE: TESTOSTERONE: 473 ng/dL (ref 250–827)

## 2016-06-27 LAB — HEMOGLOBIN A1C
HEMOGLOBIN A1C: 5.4 % (ref <5.7)
MEAN PLASMA GLUCOSE: 108 mg/dL

## 2016-06-27 MED ORDER — TESTOSTERONE CYPIONATE 200 MG/ML IM SOLN: INTRAMUSCULAR | Status: DC

## 2016-08-08 ENCOUNTER — Encounter: Payer: Self-pay | Admitting: Internal Medicine

## 2016-08-08 ENCOUNTER — Ambulatory Visit (INDEPENDENT_AMBULATORY_CARE_PROVIDER_SITE_OTHER): Payer: 59 | Admitting: Internal Medicine

## 2016-08-08 VITALS — BP 140/88 | HR 106 | Temp 98.2°F | Resp 16

## 2016-08-08 DIAGNOSIS — J069 Acute upper respiratory infection, unspecified: Secondary | ICD-10-CM | POA: Diagnosis not present

## 2016-08-08 MED ORDER — FLUTICASONE PROPIONATE 50 MCG/ACT NA SUSP: 2.0000 | Freq: Every day | NASAL | 0 refills | Status: DC

## 2016-08-08 MED ORDER — AZITHROMYCIN 250 MG PO TABS: ORAL_TABLET | ORAL | 0 refills | Status: DC

## 2016-08-08 MED ORDER — PREDNISONE 20 MG PO TABS: ORAL_TABLET | ORAL | 0 refills | Status: DC

## 2016-08-08 MED ORDER — DIAZEPAM 2 MG PO TABS: 2.0000 mg | ORAL_TABLET | Freq: Three times a day (TID) | ORAL | 0 refills | Status: DC | PRN

## 2016-08-08 MED ORDER — PROMETHAZINE-DM 6.25-15 MG/5ML PO SYRP: ORAL_SOLUTION | ORAL | 1 refills | Status: DC

## 2016-08-08 NOTE — Patient Instructions (Signed)
Please take phenergan DM as needed for coughing as needed up to 3 times daily.    Please take prednisone as directed until it gone.  Please take zpak until it is gone.  Please take valium if you cannot get hiccups to go away with the cough syrup.  Don't drive with valium on board.  Please use 2 sprays of flonase per nostril at bedtime.  Please take zyrtec, claritin, or allegra.

## 2016-08-08 NOTE — Progress Notes (Signed)
HPI  Patient presents to the office for evaluation of congestion, dizziness, and sinus pressure.  It has been going on for 3 days.  Patient reports wet, cough with yellow nasal congestion.  They also endorse postnasal drip and sinus congestion, bilateral ear congestion, sinus headache.  .  They have tried tylenol and OTC mucinex.  They report that nothing has worked.  They admits to other sick contacts.  Review of Systems  Constitutional: Positive for malaise/fatigue. Negative for chills and fever.  HENT: Positive for congestion, ear pain, hearing loss and sore throat.   Respiratory: Positive for cough. Negative for sputum production, shortness of breath and wheezing.   Cardiovascular: Negative for chest pain, palpitations and leg swelling.  Neurological: Positive for headaches.    PE:  Vitals:   08/08/16 1611  BP: 140/88  Pulse: (!) 106  Resp: 16  Temp: 98.2 F (36.8 C)    General:  Alert and non-toxic, WDWN, NAD HEENT: NCAT, PERLA, EOM normal, no occular discharge or erythema.  Nasal mucosal edema with sinus tenderness to palpation.  Oropharynx clear with minimal oropharyngeal edema and erythema.  Mucous membranes moist and pink. Neck:  Cervical adenopathy Chest:  RRR no MRGs.  Lungs clear to auscultation A&P with no wheezes rhonchi or rales.   Abdomen: +BS x 4 quadrants, soft, non-tender, no guarding, rigidity, or rebound. Skin: warm and dry no rash Neuro: A&Ox4, CN II-XII grossly intact  Assessment and Plan:   1. Acute URI -antihistamines - promethazine-dextromethorphan (PROMETHAZINE-DM) 6.25-15 MG/5ML syrup; Take 5-10 mL PO q8hrs prn for coughing  Dispense: 180 mL; Refill: 1 - diazepam (VALIUM) 2 MG tablet; Take 1 tablet (2 mg total) by mouth every 8 (eight) hours as needed for anxiety.  Dispense: 30 tablet; Refill: 0 - predniSONE (DELTASONE) 20 MG tablet; 3 tabs po daily x 3 days, then 2 tabs x 3 days, then 1.5 tabs x 3 days, then 1 tab x 3 days, then 0.5 tabs x 3 days   Dispense: 27 tablet; Refill: 0 - azithromycin (ZITHROMAX Z-PAK) 250 MG tablet; 2 po day one, then 1 daily x 4 days  Dispense: 6 tablet; Refill: 0 - fluticasone (FLONASE) 50 MCG/ACT nasal spray; Place 2 sprays into both nostrils daily.  Dispense: 16 g; Refill: 0

## 2016-09-20 ENCOUNTER — Other Ambulatory Visit: Payer: Self-pay | Admitting: *Deleted

## 2016-09-20 MED ORDER — TESTOSTERONE CYPIONATE 200 MG/ML IM SOLN: INTRAMUSCULAR | 1 refills | Status: DC

## 2016-09-22 ENCOUNTER — Other Ambulatory Visit: Payer: Self-pay | Admitting: Internal Medicine

## 2016-10-17 ENCOUNTER — Ambulatory Visit (INDEPENDENT_AMBULATORY_CARE_PROVIDER_SITE_OTHER): Payer: 59 | Admitting: Internal Medicine

## 2016-10-17 VITALS — BP 136/84 | HR 88 | Temp 97.5°F | Resp 16 | Ht 70.0 in | Wt 257.0 lb

## 2016-10-17 DIAGNOSIS — Z111 Encounter for screening for respiratory tuberculosis: Secondary | ICD-10-CM | POA: Diagnosis not present

## 2016-10-17 DIAGNOSIS — R5383 Other fatigue: Secondary | ICD-10-CM

## 2016-10-17 DIAGNOSIS — E349 Endocrine disorder, unspecified: Secondary | ICD-10-CM

## 2016-10-17 DIAGNOSIS — Z9989 Dependence on other enabling machines and devices: Secondary | ICD-10-CM

## 2016-10-17 DIAGNOSIS — I10 Essential (primary) hypertension: Secondary | ICD-10-CM

## 2016-10-17 DIAGNOSIS — Z125 Encounter for screening for malignant neoplasm of prostate: Secondary | ICD-10-CM

## 2016-10-17 DIAGNOSIS — R7303 Prediabetes: Secondary | ICD-10-CM

## 2016-10-17 DIAGNOSIS — E782 Mixed hyperlipidemia: Secondary | ICD-10-CM

## 2016-10-17 DIAGNOSIS — Z136 Encounter for screening for cardiovascular disorders: Secondary | ICD-10-CM | POA: Diagnosis not present

## 2016-10-17 DIAGNOSIS — Z0001 Encounter for general adult medical examination with abnormal findings: Secondary | ICD-10-CM

## 2016-10-17 DIAGNOSIS — Z1212 Encounter for screening for malignant neoplasm of rectum: Secondary | ICD-10-CM

## 2016-10-17 DIAGNOSIS — Z79899 Other long term (current) drug therapy: Secondary | ICD-10-CM

## 2016-10-17 DIAGNOSIS — Z0000 Encounter for general adult medical examination without abnormal findings: Secondary | ICD-10-CM | POA: Diagnosis not present

## 2016-10-17 DIAGNOSIS — E559 Vitamin D deficiency, unspecified: Secondary | ICD-10-CM

## 2016-10-17 DIAGNOSIS — G4733 Obstructive sleep apnea (adult) (pediatric): Secondary | ICD-10-CM

## 2016-10-17 LAB — LIPID PANEL
Cholesterol: 174 mg/dL (ref <200)
HDL: 22 mg/dL — ABNORMAL LOW (ref >40)
LDL CALC: 81 mg/dL
TRIGLYCERIDES: 354 mg/dL — AB (ref <150)
Total CHOL/HDL Ratio: 7.9 Ratio — ABNORMAL HIGH (ref <5.0)
VLDL: 71 mg/dL — AB (ref <30)

## 2016-10-17 LAB — BASIC METABOLIC PANEL WITH GFR
BUN: 11 mg/dL (ref 7–25)
CALCIUM: 9.8 mg/dL (ref 8.6–10.3)
CO2: 28 mmol/L (ref 20–31)
CREATININE: 1.39 mg/dL — AB (ref 0.60–1.35)
Chloride: 102 mmol/L (ref 98–110)
GFR, EST AFRICAN AMERICAN: 70 mL/min (ref >=60)
GFR, Est Non African American: 61 mL/min (ref >=60)
Glucose, Bld: 73 mg/dL (ref 65–99)
Potassium: 4.2 mmol/L (ref 3.5–5.3)
SODIUM: 140 mmol/L (ref 135–146)

## 2016-10-17 LAB — CBC WITH DIFFERENTIAL/PLATELET
BASOS ABS: 0 {cells}/uL (ref 0–200)
Basophils Relative: 0 %
EOS PCT: 2 %
Eosinophils Absolute: 118 cells/uL (ref 15–500)
HCT: 52.6 % — ABNORMAL HIGH (ref 38.5–50.0)
Hemoglobin: 17.8 g/dL — ABNORMAL HIGH (ref 13.2–17.1)
LYMPHS PCT: 31 %
Lymphs Abs: 1829 cells/uL (ref 850–3900)
MCH: 27 pg (ref 27.0–33.0)
MCHC: 33.8 g/dL (ref 32.0–36.0)
MCV: 79.8 fL — ABNORMAL LOW (ref 80.0–100.0)
MONOS PCT: 9 %
MPV: 9.3 fL (ref 7.5–12.5)
Monocytes Absolute: 531 cells/uL (ref 200–950)
NEUTROS PCT: 58 %
Neutro Abs: 3422 cells/uL (ref 1500–7800)
PLATELETS: 206 10*3/uL (ref 140–400)
RBC: 6.59 MIL/uL — AB (ref 4.20–5.80)
RDW: 15.1 % — AB (ref 11.0–15.0)
WBC: 5.9 10*3/uL (ref 3.8–10.8)

## 2016-10-17 LAB — HEPATIC FUNCTION PANEL
ALT: 43 U/L (ref 9–46)
AST: 48 U/L — ABNORMAL HIGH (ref 10–40)
Albumin: 4.6 g/dL (ref 3.6–5.1)
Alkaline Phosphatase: 45 U/L (ref 40–115)
BILIRUBIN DIRECT: 0.1 mg/dL (ref <=0.2)
Indirect Bilirubin: 0.4 mg/dL (ref 0.2–1.2)
Total Bilirubin: 0.5 mg/dL (ref 0.2–1.2)
Total Protein: 6.9 g/dL (ref 6.1–8.1)

## 2016-10-17 LAB — TSH: TSH: 0.59 m[IU]/L (ref 0.40–4.50)

## 2016-10-17 LAB — MAGNESIUM: Magnesium: 1.9 mg/dL (ref 1.5–2.5)

## 2016-10-17 NOTE — Progress Notes (Signed)
[facility]   Unk Pinto, M.D.    Mike Cohen. Silverio Lay, P.A.-C      Starlyn Skeans, P.A.-C  [facility]                [address] [place] [id number] Telephone [phone] Telefax [phone] Annual  Screening/Preventative Visit  & Comprehensive Evaluation & Examination     This very nice 45 y.o. MBM presents for a Screening/Preventative Visit & comprehensive evaluation and management of multiple medical co-morbidities.  Patient has been followed for labile  HTN, Prediabetes, Hyperlipidemia, Testosterone  and Vitamin D Deficiency.      Patient has OSA on CPAP since 2013 and endorses improved quality of sleep and less daytime somnolence.      Patient is monitored expectantly for labile HTN predating since 2007. Patient's BP has been controlled at home.Today's BP is borderline at  136/84. Patient denies any cardiac symptoms as chest pain, palpitations, shortness of breath, dizziness or ankle swelling.     Patient's hyperlipidemia is controlled with diet and medications. Patient denies myalgias or other medication SE's. Last lipids were at goal albeit elevated Trig's: Lab Results  Component Value Date   CHOL 140 06/26/2016   HDL 25 (L) 06/26/2016   LDLCALC 66 06/26/2016   TRIG 244 (H) 06/26/2016   CHOLHDL 5.6 (H) 06/26/2016      Patient has Morbid Obesity (BMI 36+) and consequent prediabetes circa Aug 2012 with A1c 6.5% dropping to 6.1% in Dec 2012 and still 6.1% in Aug 2016.  Patient denies reactive hypoglycemic symptoms, visual blurring, diabetic polys or paresthesias. Last A1c was at goal: Lab Results  Component Value Date   HGBA1C 5.4 06/26/2016       Patient was dx'd Low T (252 in 2010 and 117 in 2012).  Finally, patient has history of Vitamin D Deficiency in 2009 of "9" and last vitamin D was at goal: Lab Results  Component Value Date   VD25OH 60 06/26/2016   Current Outpatient  Prescriptions on File Prior to Visit  Medication Sig  . VITAMIN D Take 2,000 Int'l Units  daily.  . diazepam  2 MG tablet Take 1 tab every 8  hours as needed for anxiety.  Marland Kitchen FLONASE  nasal spray Place 2 sprays into both nostrils daily.  . sildenafil (REVATIO) 20 MG  TAKE 1 TO 5 TAB DAILY AS NEEDED  . Tadalafil 20 MG tablet Take 1/2 to 1 tablet every 3 to 4 days as needed for XXXX  . testosterone cypio 200 MG/ML injec INJECT 2 ML  INTRAMUSCULARLY EVERY 2 WEEKS   No Known Allergies   Past Medical History:  Diagnosis Date  . Hyperlipidemia   . Hypertension   . Other testicular hypofunction   . Prediabetes    Health Maintenance  Topic Date Due  . HIV Screening  09/26/1986  . INFLUENZA VACCINE  07/10/2016  . TETANUS/TDAP  09/18/2025   Immunization History  Administered Date(s) Administered  . Influenza Split 09/22/2014, 09/19/2015, 09/12/2016  . PPD Test 09/19/2015  . Td 10/13/2002  . Tdap 09/19/2015   Past Surgical History:  Procedure Laterality Date  . ROTATOR CUFF REPAIR Right 2007  . SHOULDER ARTHROSCOPY Right 2000   Family History  Problem Relation Age of Onset  . Heart disease Father   . Hyperlipidemia Father   . Hypertension Father  Social History   Social History  . Marital status: Married    Spouse name: N/A  . Number of children: N/A  . Years of education: N/A   Occupational History  . Not on file.   Social History Main Topics  . Smoking status: Never Smoker  . Smokeless tobacco: Not on file     Comment: occasional cigar  . Alcohol use 0.0 oz/week     Comment: occasional  . Drug use: No  . Sexual activity: Not on file    ROS Constitutional: Denies fever, chills, weight loss/gain, headaches, insomnia,  night sweats or change in appetite. Does c/o fatigue. Eyes: Denies redness, blurred vision, diplopia, discharge, itchy or watery eyes.  ENT: Denies discharge, congestion, post nasal drip, epistaxis, sore throat, earache, hearing loss, dental  pain, Tinnitus, Vertigo, Sinus pain or snoring.  Cardio: Denies chest pain, palpitations, irregular heartbeat, syncope, dyspnea, diaphoresis, orthopnea, PND, claudication or edema Respiratory: denies cough, dyspnea, DOE, pleurisy, hoarseness, laryngitis or wheezing.  Gastrointestinal: Denies dysphagia, heartburn, reflux, water brash, pain, cramps, nausea, vomiting, bloating, diarrhea, constipation, hematemesis, melena, hematochezia, jaundice or hemorrhoids Genitourinary: Denies dysuria, frequency, urgency, nocturia, hesitancy, discharge, hematuria or flank pain Musculoskeletal: Denies arthralgia, myalgia, stiffness, Jt. Swelling, pain, limp or strain/sprain. Denies Falls. Skin: Denies puritis, rash, hives, warts, acne, eczema or change in skin lesion Neuro: No weakness, tremor, incoordination, spasms, paresthesia or pain Psychiatric: Denies confusion, memory loss or sensory loss. Denies Depression. Endocrine: Denies change in weight, skin, hair change, nocturia, and paresthesia, diabetic polys, visual blurring or hyper / hypo glycemic episodes.  Heme/Lymph: No excessive bleeding, bruising or enlarged lymph nodes.  Physical Exam  BP 136/84   Pulse 88   Temp 97.5 F (36.4 C)   Resp 16   Ht 5\' 10"  (1.778 m)   Wt 257 lb (116.6 kg)   BMI 36.88 kg/m   General Appearance: Well nourished, in no apparent distress.  Eyes: PERRLA, EOMs, conjunctiva no swelling or erythema, normal fundi and vessels. Sinuses: No frontal/maxillary tenderness ENT/Mouth: EACs patent / TMs  nl. Nares clear without erythema, swelling, mucoid exudates. Oral hygiene is good. No erythema, swelling, or exudate. Tongue normal, non-obstructing. Tonsils not swollen or erythematous. Hearing normal.  Neck: Supple, thyroid normal. No bruits, nodes or JVD. Respiratory: Respiratory effort normal.  BS equal and clear bilateral without rales, rhonci, wheezing or stridor. Cardio: Heart sounds are normal with regular rate and rhythm  and no murmurs, rubs or gallops. Peripheral pulses are normal and equal bilaterally without edema. No aortic or femoral bruits. Chest: symmetric with normal excursions and percussion.  Abdomen: Soft, with Nl bowel sounds. Nontender, no guarding, rebound, hernias, masses, or organomegaly.  Lymphatics: Non tender without lymphadenopathy.  Genitourinary: No hernias.Testes nl. DRE - prostate nl for age - smooth & firm w/o nodules. Musculoskeletal: Full ROM all peripheral extremities, joint stability, 5/5 strength, and normal gait. Skin: Warm and dry without rashes, lesions, cyanosis, clubbing or  ecchymosis.  Neuro: Cranial nerves intact, reflexes equal bilaterally. Normal muscle tone, no cerebellar symptoms. Sensation intact.  Pysch: Alert and oriented X 3 with normal affect, insight and judgment appropriate.   Assessment and Plan  1. Annual Preventative/Screening Exam   - Microalbumin / creatinine urine ratio - EKG 12-Lead - POC Hemoccult Bld/Stl  - Urinalysis, Routine w reflex microscopic  - Vitamin B12 - PSA - Testosterone - CBC with Differential/Platelet - BASIC METABOLIC PANEL WITH GFR - Hepatic function panel - Magnesium - Lipid panel - TSH - Hemoglobin A1c -  Insulin, random - VITAMIN D 25 Hydroxy   2. Essential hypertension  - Microalbumin / creatinine urine ratio - EKG 12-Lead - TSH  3. Mixed hyperlipidemia  - EKG 12-Lead - Lipid panel - TSH  4. Prediabetes  - EKG 12-Lead - Hemoglobin A1c - Insulin, random  5. Vitamin D deficiency  - VITAMIN D 25 Hydroxy   6. Testosterone deficiency  - Testosterone  7. OSA on CPAP   8. Screening for rectal cancer  - POC Hemoccult Bld/Stl   9. Prostate cancer screening  - PSA  10. Screening for ischemic heart disease  - EKG 12-Lead  11. Medication management  - Urinalysis, Routine w reflex microscopic  - CBC with Differential/Platelet - BASIC METABOLIC PANEL WITH GFR - Hepatic function panel -  Magnesium  12. Fatigue, unspecified type  - Vitamin B12 - Testosterone - TSH  13. Screening examination for pulmonary tuberculosis  - PPD      Continue prudent diet as discussed, weight control, BP monitoring, regular exercise, and medications as discussed.  Discussed med effects and SE's. Routine screening labs and tests as requested with regular follow-up as recommended. Over 40 minutes of exam, counseling, chart review and high complex critical decision making was performed.

## 2016-10-17 NOTE — Patient Instructions (Signed)

## 2016-10-18 LAB — URINALYSIS, ROUTINE W REFLEX MICROSCOPIC
Bilirubin Urine: NEGATIVE
Glucose, UA: NEGATIVE
HGB URINE DIPSTICK: NEGATIVE
Ketones, ur: NEGATIVE
LEUKOCYTES UA: NEGATIVE
NITRITE: NEGATIVE
PROTEIN: NEGATIVE
Specific Gravity, Urine: 1.01 (ref 1.001–1.035)
pH: 7 (ref 5.0–8.0)

## 2016-10-18 LAB — HEMOGLOBIN A1C
Hgb A1c MFr Bld: 6 % — ABNORMAL HIGH
Mean Plasma Glucose: 126 mg/dL

## 2016-10-18 LAB — TESTOSTERONE: TESTOSTERONE: 825 ng/dL (ref 250–827)

## 2016-10-18 LAB — MICROALBUMIN / CREATININE URINE RATIO
Creatinine, Urine: 83 mg/dL (ref 20–370)
MICROALB UR: 0.2 mg/dL
MICROALB/CREAT RATIO: 2 ug/mg{creat} (ref <30)

## 2016-10-18 LAB — VITAMIN D 25 HYDROXY (VIT D DEFICIENCY, FRACTURES): Vit D, 25-Hydroxy: 76 ng/mL (ref 30–100)

## 2016-10-18 LAB — INSULIN, RANDOM: Insulin: 89.5 u[IU]/mL — ABNORMAL HIGH (ref 2.0–19.6)

## 2016-10-18 LAB — VITAMIN B12: Vitamin B-12: 396 pg/mL (ref 200–1100)

## 2016-10-18 LAB — PSA: PSA: 0.4 ng/mL (ref <=4.0)

## 2016-10-20 ENCOUNTER — Encounter: Payer: Self-pay | Admitting: Internal Medicine

## 2016-10-25 ENCOUNTER — Encounter: Payer: Self-pay | Admitting: Internal Medicine

## 2016-10-29 LAB — TB SKIN TEST
Induration: 0 mm
TB Skin Test: NEGATIVE

## 2017-04-18 ENCOUNTER — Ambulatory Visit (INDEPENDENT_AMBULATORY_CARE_PROVIDER_SITE_OTHER): Payer: 59 | Admitting: Internal Medicine

## 2017-04-18 VITALS — BP 118/82 | HR 84 | Temp 97.3°F | Resp 16 | Ht 70.0 in | Wt 255.4 lb

## 2017-04-18 DIAGNOSIS — E559 Vitamin D deficiency, unspecified: Secondary | ICD-10-CM

## 2017-04-18 DIAGNOSIS — G4733 Obstructive sleep apnea (adult) (pediatric): Secondary | ICD-10-CM | POA: Diagnosis not present

## 2017-04-18 DIAGNOSIS — R7303 Prediabetes: Secondary | ICD-10-CM | POA: Diagnosis not present

## 2017-04-18 DIAGNOSIS — E349 Endocrine disorder, unspecified: Secondary | ICD-10-CM

## 2017-04-18 DIAGNOSIS — Z79899 Other long term (current) drug therapy: Secondary | ICD-10-CM

## 2017-04-18 DIAGNOSIS — I10 Essential (primary) hypertension: Secondary | ICD-10-CM | POA: Diagnosis not present

## 2017-04-18 DIAGNOSIS — E782 Mixed hyperlipidemia: Secondary | ICD-10-CM | POA: Diagnosis not present

## 2017-04-18 DIAGNOSIS — Z9989 Dependence on other enabling machines and devices: Secondary | ICD-10-CM

## 2017-04-18 LAB — BASIC METABOLIC PANEL WITH GFR
BUN: 15 mg/dL (ref 7–25)
CALCIUM: 9.5 mg/dL (ref 8.6–10.3)
CHLORIDE: 105 mmol/L (ref 98–110)
CO2: 21 mmol/L (ref 20–31)
CREATININE: 1.14 mg/dL (ref 0.60–1.35)
GFR, Est African American: 89 mL/min (ref >=60)
GFR, Est Non African American: 77 mL/min (ref >=60)
GLUCOSE: 96 mg/dL (ref 65–99)
Potassium: 4.2 mmol/L (ref 3.5–5.3)
SODIUM: 138 mmol/L (ref 135–146)

## 2017-04-18 LAB — CBC WITH DIFFERENTIAL/PLATELET
BASOS ABS: 43 {cells}/uL (ref 0–200)
Basophils Relative: 1 %
EOS ABS: 129 {cells}/uL (ref 15–500)
EOS PCT: 3 %
HCT: 44.4 % (ref 38.5–50.0)
HEMOGLOBIN: 15.2 g/dL (ref 13.2–17.1)
LYMPHS ABS: 1978 {cells}/uL (ref 850–3900)
Lymphocytes Relative: 46 %
MCH: 26.8 pg — AB (ref 27.0–33.0)
MCHC: 34.2 g/dL (ref 32.0–36.0)
MCV: 78.3 fL — AB (ref 80.0–100.0)
MPV: 9.7 fL (ref 7.5–12.5)
Monocytes Absolute: 344 cells/uL (ref 200–950)
Monocytes Relative: 8 %
Neutro Abs: 1806 cells/uL (ref 1500–7800)
Neutrophils Relative %: 42 %
Platelets: 223 10*3/uL (ref 140–400)
RBC: 5.67 MIL/uL (ref 4.20–5.80)
RDW: 15.1 % — ABNORMAL HIGH (ref 11.0–15.0)
WBC: 4.3 10*3/uL (ref 3.8–10.8)

## 2017-04-18 LAB — HEPATIC FUNCTION PANEL
ALT: 36 U/L (ref 9–46)
AST: 38 U/L (ref 10–40)
Albumin: 4.5 g/dL (ref 3.6–5.1)
Alkaline Phosphatase: 43 U/L (ref 40–115)
Bilirubin, Direct: 0.1 mg/dL (ref <=0.2)
Indirect Bilirubin: 0.4 mg/dL (ref 0.2–1.2)
TOTAL PROTEIN: 6.9 g/dL (ref 6.1–8.1)
Total Bilirubin: 0.5 mg/dL (ref 0.2–1.2)

## 2017-04-18 LAB — LIPID PANEL
CHOLESTEROL: 154 mg/dL (ref <200)
HDL: 27 mg/dL — ABNORMAL LOW (ref >40)
LDL CALC: 77 mg/dL (ref <100)
TRIGLYCERIDES: 252 mg/dL — AB (ref <150)
Total CHOL/HDL Ratio: 5.7 Ratio — ABNORMAL HIGH (ref <5.0)
VLDL: 50 mg/dL — ABNORMAL HIGH (ref <30)

## 2017-04-18 LAB — TSH: TSH: 0.77 mIU/L (ref 0.40–4.50)

## 2017-04-18 NOTE — Patient Instructions (Signed)

## 2017-04-18 NOTE — Progress Notes (Signed)
This very nice 46 y.o. MBM presents for 6 month follow up with labile pertension, Hyperlipidemia, Pre-Diabetes and Vitamin D Deficiency. Patient is on CPAP for OSA with improved sleep hygiene.      Patient is followed expectantly for Labile HTN since 2007 & BP has been controlled at home. Today's BP is at goal - 118/82. Patient has had no complaints of any cardiac type chest pain, palpitations, dyspnea/orthopnea/PND, dizziness, claudication, or dependent edema.     Hypercholesterolemia is controlled with diet (but not Triglycerides). Last Lipids were at goal albeit elevated Trig's: Lab Results  Component Value Date   CHOL 174 10/17/2016   HDL 22 (L) 10/17/2016   LDLCALC 81 10/17/2016   TRIG 354 (H) 10/17/2016   CHOLHDL 7.9 (H) 10/17/2016      Also, the patient has history of Morbid Obesity (BMI 36+) and PreDiabetes (A1c 6.5 in Aug 2012, then dropping to 6.1% in Dec 2012 and Aug 2016) and he has had no symptoms of reactive hypoglycemia, diabetic polys, paresthesias or visual blurring.  Last A1c was still not at goal: Lab Results  Component Value Date   HGBA1C 6.0 (H) 10/17/2016      Patient has Low Testosterone ( level 252 in 2010 and 117 in 2012) and I=s on parenteral replacement with improved mood, stamina & sense of well being. Further, the patient also has history of Vitamin D Deficiency ("9" in 2009)  and supplements vitamin D without any suspected side-effects. Last vitamin D was   Lab Results  Component Value Date   VD25OH 76 10/17/2016   Current Outpatient Prescriptions on File Prior to Visit  Medication Sig  . VITAMIN D Take 2,000 Int'l Units by mouth daily.  Marland Kitchen FLONASE  nasal spray Place 2 sprays into both nostrils daily.  . sildenafil  20 MG tablet TAKE 1 TO 5 TABLETS BY MOUTH DAILY AS NEEDED  . tadalafil (CIALIS) 20 MG tablet Take 1/2 to 1 tablet every 3 to 4 days as needed for XXXX  . testosterone cypio 200 MG inj INJECT 2 ML  INTRAMUSCULARLY EVERY 2 WEEKS   No Known  Allergies  PMHx:   Past Medical History:  Diagnosis Date  . Hyperlipidemia   . Hypertension   . Other testicular hypofunction   . Prediabetes    Immunization History  Administered Date(s) Administered  . Influenza Split 09/22/2014, 09/19/2015, 09/12/2016  . PPD Test 09/19/2015, 10/17/2016  . Td 10/13/2002  . Tdap 09/19/2015   Past Surgical History:  Procedure Laterality Date  . ROTATOR CUFF REPAIR Right 2007  . SHOULDER ARTHROSCOPY Right 2000   FHx:    Reviewed / unchanged  SHx:    Reviewed / unchanged  Systems Review:  Constitutional: Denies fever, chills, wt changes, headaches, insomnia, fatigue, night sweats, change in appetite. Eyes: Denies redness, blurred vision, diplopia, discharge, itchy, watery eyes.  ENT: Denies discharge, congestion, post nasal drip, epistaxis, sore throat, earache, hearing loss, dental pain, tinnitus, vertigo, sinus pain, snoring.  CV: Denies chest pain, palpitations, irregular heartbeat, syncope, dyspnea, diaphoresis, orthopnea, PND, claudication or edema. Respiratory: denies cough, dyspnea, DOE, pleurisy, hoarseness, laryngitis, wheezing.  Gastrointestinal: Denies dysphagia, odynophagia, heartburn, reflux, water brash, abdominal pain or cramps, nausea, vomiting, bloating, diarrhea, constipation, hematemesis, melena, hematochezia  or hemorrhoids. Genitourinary: Denies dysuria, frequency, urgency, nocturia, hesitancy, discharge, hematuria or flank pain. Musculoskeletal: Denies arthralgias, myalgias, stiffness, jt. swelling, pain, limping or strain/sprain.  Skin: Denies pruritus, rash, hives, warts, acne, eczema or change in skin lesion(s).  Neuro: No weakness, tremor, incoordination, spasms, paresthesia or pain. Psychiatric: Denies confusion, memory loss or sensory loss. Endo: Denies change in weight, skin or hair change.  Heme/Lymph: No excessive bleeding, bruising or enlarged lymph nodes.  Physical Exam  BP 118/82   Pulse 84   Temp 97.3 F  (36.3 C)   Resp 16   Ht 5\' 10"  (1.778 m)   Wt 255 lb 6.4 oz (115.8 kg)   BMI 36.65 kg/m   Appears over nourished, well groomed  and in no distress.  Eyes: PERRLA, EOMs, conjunctiva no swelling or erythema. Sinuses: No frontal/maxillary tenderness ENT/Mouth: EAC's clear, TM's nl w/o erythema, bulging. Nares clear w/o erythema, swelling, exudates. Oropharynx clear without erythema or exudates. Oral hygiene is good. Tongue normal, non obstructing. Hearing intact.  Neck: Supple. Thyroid nl. Car 2+/2+ without bruits, nodes or JVD. Chest: Respirations nl with BS clear & equal w/o rales, rhonchi, wheezing or stridor.  Cor: Heart sounds normal w/ regular rate and rhythm without sig. murmurs, gallops, clicks or rubs. Peripheral pulses normal and equal  without edema.  Abdomen: Soft & bowel sounds normal. Non-tender w/o guarding, rebound, hernias, masses or organomegaly.  Lymphatics: Unremarkable.  Musculoskeletal: Full ROM all peripheral extremities, joint stability, 5/5 strength and normal gait.  Skin: Warm, dry without exposed rashes, lesions or ecchymosis apparent.  Neuro: Cranial nerves intact, reflexes equal bilaterally. Sensory-motor testing grossly intact. Tendon reflexes grossly intact.  Pysch: Alert & oriented x 3.  Insight and judgement nl & appropriate. No ideations.  Assessment and Plan:  1. Essential hypertension  - Continue medication, monitor blood pressure at home.  - Continue DASH diet. Reminder to go to the ER if any CP,  SOB, nausea, dizziness, severe HA, changes vision/speech,  left arm numbness and tingling and jaw pain.  - CBC with Differential/Platelet - BASIC METABOLIC PANEL WITH GFR - Magnesium - TSH  2. Mixed hyperlipidemia  - Continue diet/meds, exercise,& lifestyle modifications.  - Continue monitor periodic cholesterol/liver & renal functions   - Hepatic function panel - Lipid panel - TSH  3. Prediabetes  - Continue diet, exercise, lifestyle  modifications.  - Monitor appropriate labs.  - Hemoglobin A1c - Insulin, random  4. Vitamin D deficiency  - Continue supplementation.  - VITAMIN D 25 Hydroxy  5. Testosterone deficiency  - Testosterone  6. OSA on CPAP   7. Medication management  - CBC with Differential/Platelet - BASIC METABOLIC PANEL WITH GFR - Hepatic function panel - Magnesium - Lipid panel - TSH - Hemoglobin A1c - Insulin, random - VITAMIN D 25 Hydroxy - Testosterone       Discussed  regular exercise, BP monitoring, weight control to achieve/maintain BMI less than 25 and discussed med and SE's. Recommended labs to assess and monitor clinical status with further disposition pending results of labs. Over 30 minutes of exam, counseling, chart review was performed.

## 2017-04-19 LAB — MAGNESIUM: MAGNESIUM: 1.9 mg/dL (ref 1.5–2.5)

## 2017-04-19 LAB — VITAMIN D 25 HYDROXY (VIT D DEFICIENCY, FRACTURES): VIT D 25 HYDROXY: 110 ng/mL — AB (ref 30–100)

## 2017-04-19 LAB — HEMOGLOBIN A1C
Hgb A1c MFr Bld: 6 % — ABNORMAL HIGH (ref <5.7)
MEAN PLASMA GLUCOSE: 126 mg/dL

## 2017-04-19 LAB — TESTOSTERONE: TESTOSTERONE: 133 ng/dL — AB (ref 250–827)

## 2017-04-19 LAB — INSULIN, RANDOM: INSULIN: 29.4 u[IU]/mL — AB (ref 2.0–19.6)

## 2017-04-21 ENCOUNTER — Encounter: Payer: Self-pay | Admitting: Internal Medicine

## 2017-06-21 ENCOUNTER — Other Ambulatory Visit: Payer: Self-pay | Admitting: Internal Medicine

## 2017-06-21 NOTE — Telephone Encounter (Signed)
Please call D-Test 

## 2017-08-04 ENCOUNTER — Other Ambulatory Visit: Payer: Self-pay | Admitting: Internal Medicine

## 2017-08-07 NOTE — Progress Notes (Deleted)
Assessment and Plan:  Hypertension:  -Continue medication,  -monitor blood pressure at home.  -Continue DASH diet.   -Reminder to go to the ER if any CP, SOB, nausea, dizziness, severe HA, changes vision/speech, left arm numbness and tingling, and jaw pain.  Cholesterol: -Continue diet and exercise.  -Check cholesterol.   Pre-diabetes: -Continue diet and exercise.  -Check A1C  Vitamin D Def: -check level -continue medications.   Obesity -cont phentermine -diet and exercise changes recommended -weight today does reflect that he is currently wearing his utility belt for work including his sidearm  Continue diet and meds as discussed. Further disposition pending results of labs.  HPI 46 y.o. male  presents for 3 month follow up with hypertension, hyperlipidemia, prediabetes and vitamin D.   His blood pressure has been controlled at home, today their BP is  .   He does workout. He denies chest pain, shortness of breath, dizziness.   He is not on cholesterol medication and denies myalgias. His cholesterol is at goal. The cholesterol last visit was:   Lab Results  Component Value Date   CHOL 154 04/18/2017   HDL 27 (L) 04/18/2017   LDLCALC 77 04/18/2017   TRIG 252 (H) 04/18/2017   CHOLHDL 5.7 (H) 04/18/2017     He has been working on diet and exercise for prediabetes, and denies foot ulcerations, hyperglycemia, hypoglycemia , increased appetite, nausea, paresthesia of the feet, polydipsia, polyuria, visual disturbances, vomiting and weight loss. Last A1C in the office was:  Lab Results  Component Value Date   HGBA1C 6.0 (H) 04/18/2017    Patient is on Vitamin D supplement.  Lab Results  Component Value Date   VD25OH 110 (H) 04/18/2017     He did go to see a back orthopedist due to low back pain.  He reports that he has been taking prednisone.  He did see Dr. Rhona Raider.  He reports that he is better than he was doing, but is not healed.  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  He is getting ready to go to a [employer] in [place] for the next month.    He is still taking phentermine.  He believes that it is working despite his weight today.  He was weighed with his utility belt on.    Current Medications:  Current Outpatient Prescriptions on File Prior to Visit  Medication Sig Dispense Refill  . Cholecalciferol (VITAMIN D PO) Take 2,000 Int'l Units by mouth daily.    Marland Kitchen CIALIS 20 MG tablet TAKE 1/2 TO 1 TABLET BY MOUTH EVERY 3 TO 4 DAYS AS NEEDED 5 tablet PRN  . fluticasone (FLONASE) 50 MCG/ACT nasal spray Place 2 sprays into both nostrils daily. 16 g 0  . sildenafil (REVATIO) 20 MG tablet TAKE 1 TO 5 TABLETS BY MOUTH DAILY AS NEEDED 90 tablet PRN  . SYRINGE-NEEDLE, DISP, 3 ML (B-D 3CC LUER-LOK SYR 21GX1-1/2) 21G X 1-1/2" 3 ML MISC Test sugar once daily 50 each 0  . testosterone cypionate (DEPOTESTOSTERONE CYPIONATE) 200 MG/ML injection INJECT 2 MLS INTO THE MUSCLE EVERY 2 WEEKS 10 mL 3   No current facility-administered medications on file prior to visit.     Medical History:  Past Medical History:  Diagnosis Date  . Hyperlipidemia   . Hypertension   . Other testicular hypofunction   . Prediabetes  Allergies: No Known Allergies   Review of Systems:  Review of Systems  Constitutional: Negative for chills, fever and malaise/fatigue.  HENT: Negative for congestion, ear pain and sore throat.   Eyes: Negative.   Respiratory: Negative for cough, shortness of breath and wheezing.   Cardiovascular: Negative for chest pain, palpitations and leg swelling.  Gastrointestinal: Negative for abdominal pain, blood in stool, constipation, diarrhea and heartburn.  Genitourinary: Negative.   Skin: Negative.   Neurological: Negative for dizziness, sensory change, loss of consciousness and headaches.  Psychiatric/Behavioral: Negative for depression.  The patient is not nervous/anxious and does not have insomnia.     Family history- Review and unchanged  Social history- Review and unchanged  Physical Exam: There were no vitals taken for this visit. Wt Readings from Last 3 Encounters:  04/18/17 255 lb 6.4 oz (115.8 kg)  10/17/16 257 lb (116.6 kg)  06/26/16 257 lb 3.2 oz (116.7 kg)    General Appearance: Well nourished well developed, in no apparent distress. Eyes: PERRLA, EOMs, conjunctiva no swelling or erythema ENT/Mouth: Ear canals normal without obstruction, swelling, erythma, discharge.  TMs normal bilaterally.  Oropharynx moist, clear, without exudate, or postoropharyngeal swelling. Neck: Supple, thyroid normal,no cervical adenopathy  Respiratory: Respiratory effort normal, Breath sounds clear A&P without rhonchi, wheeze, or rale.  No retractions, no accessory usage. Cardio: RRR with no MRGs. Brisk peripheral pulses without edema.  Abdomen: Soft, + BS,  Non tender, no guarding, rebound, hernias, masses. Musculoskeletal: Full ROM, 5/5 strength, Normal gait Skin: Warm, dry without rashes, lesions, ecchymosis.  Neuro: Awake and oriented X 3, Cranial nerves intact. Normal muscle tone, no cerebellar symptoms. Psych: Normal affect, Insight and Judgment appropriate.    Vicie Mutters, PA-C 1:00 PM [facility]

## 2017-08-08 ENCOUNTER — Ambulatory Visit: Payer: Self-pay | Admitting: Physician Assistant

## 2017-11-04 ENCOUNTER — Ambulatory Visit (INDEPENDENT_AMBULATORY_CARE_PROVIDER_SITE_OTHER): Payer: 59

## 2017-11-04 DIAGNOSIS — Z23 Encounter for immunization: Secondary | ICD-10-CM

## 2017-11-12 NOTE — Progress Notes (Signed)
[facility]   Unk Pinto, M.D.     Uvaldo Bristle. Silverio Lay, P.A.-C Liane Comber, [place]                [address] [place], [place] [address] Telephone [phone] Telefax [phone] Annual  Screening/Preventative Visit  & Comprehensive Evaluation & Examination     This very nice 46 y.o. MBM presents for a Screening/Preventative Visit & comprehensive evaluation and management of multiple medical co-morbidities.  Patient has been followed for HTN, T2_NIDDM  Prediabetes, Hyperlipidemia and Vitamin D Deficiency. He also c/o of a "sor bump" at the opening of his L ear.      Patient is followed expectantly for labile HTN (2007). Patient's BP has been controlled at home.  Today's BP: 114/71. Patient denies any cardiac symptoms as chest pain, palpitations, shortness of breath, dizziness or ankle swelling.     Patient's hyperlipidemia is controlled with diet and medications. Patient denies myalgias or other medication SE's. Last lipids were at goal albeit elevated Trig's: Lab Results  Component Value Date   CHOL 154 04/18/2017   HDL 27 (L) 04/18/2017   LDLCALC 77 04/18/2017   TRIG 252 (H) 04/18/2017   CHOLHDL 5.7 (H) 04/18/2017      Patient has Morbid Obesity (BMI 38+) and consequent prediabetes (A1c 6.5% /2012 then 6.1% in 2012 & 2016) and patient denies reactive hypoglycemic symptoms, visual blurring, diabetic polys or paresthesias. Last A1c was not at goal:  Lab Results  Component Value Date   HGBA1C 6.0 (H) 04/18/2017       Patient also has hx/o low Testosterone ("252"/2010 and "117"/2012) and has been on Depo-Testosterone parenteral replacement injections.  Finally, patient has history of Vitamin D Deficiency ("9"/2009) and last vitamin D was borderline elevated & dose was decreased: Lab Results  Component Value Date   VD25OH 110 (H) 04/18/2017   Current Outpatient Medications on File  Prior to Visit  Medication Sig  . Cholecalciferol (VITAMIN D PO) Take 2,000 Int'l Units by mouth daily.  . fluticasone (FLONASE) 50 MCG/ACT nasal spray Place 2 sprays into both nostrils daily.  . SYRINGE-NEEDLE, DISP, 3 ML (B-D 3CC LUER-LOK SYR 21GX1-1/2) 21G X 1-1/2" 3 ML MISC Test sugar once daily  . testosterone cypionate (DEPOTESTOSTERONE CYPIONATE) 200 MG/ML injection INJECT 2 MLS INTO THE MUSCLE EVERY 2 WEEKS   No current facility-administered medications on file prior to visit.    No Known Allergies Past Medical History:  Diagnosis Date  . Hyperlipidemia   . Hypertension   . Other testicular hypofunction   . Prediabetes    Health Maintenance  Topic Date Due  . HIV Screening  09/26/1986  . TETANUS/TDAP  09/18/2025  . INFLUENZA VACCINE  Completed   Immunization History  Administered Date(s) Administered  . Influenza Inj Mdck Quad With Preservative 11/04/2017  . Influenza Split 09/22/2014, 09/19/2015, 09/12/2016  . PPD Test 09/19/2015, 10/17/2016, 11/13/2017  . Td 10/13/2002  . Tdap 09/19/2015   Past Surgical History:  Procedure Laterality Date  . ROTATOR CUFF REPAIR Right 2007  . SHOULDER ARTHROSCOPY Right 2000   Family History  Problem Relation Age of Onset  . Heart disease Father   . Hyperlipidemia Father   . Hypertension Father    Social History   Socioeconomic History  . Marital status: Married    Spouse name: Judeen Hammans  . Number of children:  1 son 20 yo  Social Needs  Occupational History  . Police Captain  Tobacco Use  . Smoking status: Never Smoker  . Smokeless tobacco: Never Used  . Tobacco comment: occasional cigar  Substance and Sexual Activity  . Alcohol use: Yes    Alcohol/week: 0.0 oz    Comment: occasional  . Drug use: No  . Sexual activity: Active    ROS Constitutional: Denies fever, chills, weight loss/gain, headaches, insomnia,  night sweats or change in appetite. Does c/o fatigue. Eyes: Denies redness, blurred vision, diplopia,  discharge, itchy or watery eyes.  ENT: Denies discharge, congestion, post nasal drip, epistaxis, sore throat, earache, hearing loss, dental pain, Tinnitus, Vertigo, Sinus pain or snoring.  Cardio: Denies chest pain, palpitations, irregular heartbeat, syncope, dyspnea, diaphoresis, orthopnea, PND, claudication or edema Respiratory: denies cough, dyspnea, DOE, pleurisy, hoarseness, laryngitis or wheezing.  Gastrointestinal: Denies dysphagia, heartburn, reflux, water brash, pain, cramps, nausea, vomiting, bloating, diarrhea, constipation, hematemesis, melena, hematochezia, jaundice or hemorrhoids Genitourinary: Denies dysuria, frequency, urgency, nocturia, hesitancy, discharge, hematuria or flank pain Musculoskeletal: Denies arthralgia, myalgia, stiffness, Jt. Swelling, pain, limp or strain/sprain. Denies Falls. Skin: Denies puritis, rash, hives, warts, acne, eczema or change in skin lesion Neuro: No weakness, tremor, incoordination, spasms, paresthesia or pain Psychiatric: Denies confusion, memory loss or sensory loss. Denies Depression. Endocrine: Denies change in weight, skin, hair change, nocturia, and paresthesia, diabetic polys, visual blurring or hyper / hypo glycemic episodes.  Heme/Lymph: No excessive bleeding, bruising or enlarged lymph nodes.  Physical Exam  BP 114/71   Pulse 92   Temp (!) 97.3 F (36.3 C)   Resp 18   Ht 5\' 11"  (1.803 m)   Wt 275 lb (124.7 kg)   BMI 38.35 kg/m   General Appearance: Well nourished and well groomed and in no apparent distress.  Eyes: PERRLA, EOMs, conjunctiva no swelling or erythema, normal fundi and vessels. Sinuses: No frontal/maxillary tenderness ENT/Mouth:  Tender inflamed sebaceous cyst at the introitus of the L EAC. EACs patent / TMs  nl. Nares clear without erythema, swelling, mucoid exudates. Oral hygiene is good. No erythema, swelling, or exudate. Tongue normal, non-obstructing. Tonsils not swollen or erythematous. Hearing normal.  Neck:  Supple, thyroid normal. No bruits, nodes or JVD. Respiratory: Respiratory effort normal.  BS equal and clear bilateral without rales, rhonci, wheezing or stridor. Cardio: Heart sounds are normal with regular rate and rhythm and no murmurs, rubs or gallops. Peripheral pulses are normal and equal bilaterally without edema. No aortic or femoral bruits. Chest: symmetric with normal excursions and percussion.  Abdomen: Soft, with Nl bowel sounds. Nontender, no guarding, rebound, hernias, masses, or organomegaly.  Lymphatics: Non tender without lymphadenopathy.  Genitourinary: No hernias.Testes nl. DRE - prostate nl for age - smooth & firm w/o nodules. Musculoskeletal: Full ROM all peripheral extremities, joint stability, 5/5 strength, and normal gait. Skin: Warm and dry without rashes, lesions, cyanosis, clubbing or  ecchymosis.  Neuro: Cranial nerves intact, reflexes equal bilaterally. Normal muscle tone, no cerebellar symptoms. Sensation intact.  Pysch: Alert and oriented X 3 with normal affect, insight and judgment appropriate.   Assessment and Plan  1. Annual Preventative  2. Essential hypertension  - EKG 12-Lead - Urinalysis, Routine w reflex microscopic - Microalbumin / creatinine urine ratio - CBC with Differential/Platelet - BASIC METABOLIC PANEL WITH GFR - Magnesium - TSH  3. Hyperlipidemia, mixed  - EKG 12-Lead - Hepatic function panel - Lipid panel - TSH  4. Prediabetes  - EKG 12-Lead - Hemoglobin  A1c  5. Vitamin D deficiency  - VITAMIN D 25 Hydroxy   6. Testosterone deficiency  - Testosterone  7. Screening examination for pulmonary tuberculosis  - PPD  8. Screening for ischemic heart disease  - EKG 12-Lead  9. Screening for colorectal cancer  - POC Hemoccult Bld/Stl   10. Prostate cancer screening  - PSA  11. Boil, ear, left  - doxycycline (VIBRAMYCIN) 100 MG capsule; Take 1 capsule 1 daily with food for skin infection  Dispense: 15 capsule;  Refill: 2  12. Obesity (BMI 35.0-39.9 without comorbidity)   13. Fatigue  - Iron,Total/Total Iron Binding Cap - Vitamin B12 - Testosterone - CBC with Differential/Platelet  14. Medication management  - Urinalysis, Routine w reflex microscopic - Microalbumin / creatinine urine ratio - Testosterone - CBC with Differential/Platelet - BASIC METABOLIC PANEL WITH GFR - Hepatic function panel - Magnesium - TSH - Hemoglobin A1c - VITAMIN D 25 Hydroxy         Patient was counseled in prudent diet, weight control to achieve/maintain BMI less than 25, BP monitoring, regular exercise and medications as discussed.  Discussed med effects and SE's. Routine screening labs and tests as requested with regular follow-up as recommended. Over 40 minutes of exam, counseling, chart review and high complex critical decision making was performed

## 2017-11-12 NOTE — Patient Instructions (Signed)

## 2017-11-13 ENCOUNTER — Ambulatory Visit: Payer: 59 | Admitting: Internal Medicine

## 2017-11-13 ENCOUNTER — Encounter: Payer: Self-pay | Admitting: Internal Medicine

## 2017-11-13 VITALS — BP 114/71 | HR 92 | Temp 97.3°F | Resp 18 | Ht 71.0 in | Wt 275.0 lb

## 2017-11-13 DIAGNOSIS — Z0000 Encounter for general adult medical examination without abnormal findings: Secondary | ICD-10-CM | POA: Diagnosis not present

## 2017-11-13 DIAGNOSIS — Z79899 Other long term (current) drug therapy: Secondary | ICD-10-CM

## 2017-11-13 DIAGNOSIS — E782 Mixed hyperlipidemia: Secondary | ICD-10-CM

## 2017-11-13 DIAGNOSIS — E669 Obesity, unspecified: Secondary | ICD-10-CM

## 2017-11-13 DIAGNOSIS — I10 Essential (primary) hypertension: Secondary | ICD-10-CM

## 2017-11-13 DIAGNOSIS — E349 Endocrine disorder, unspecified: Secondary | ICD-10-CM

## 2017-11-13 DIAGNOSIS — Z0001 Encounter for general adult medical examination with abnormal findings: Secondary | ICD-10-CM

## 2017-11-13 DIAGNOSIS — R7303 Prediabetes: Secondary | ICD-10-CM

## 2017-11-13 DIAGNOSIS — Z136 Encounter for screening for cardiovascular disorders: Secondary | ICD-10-CM | POA: Diagnosis not present

## 2017-11-13 DIAGNOSIS — Z1212 Encounter for screening for malignant neoplasm of rectum: Secondary | ICD-10-CM

## 2017-11-13 DIAGNOSIS — R5383 Other fatigue: Secondary | ICD-10-CM

## 2017-11-13 DIAGNOSIS — Z1211 Encounter for screening for malignant neoplasm of colon: Secondary | ICD-10-CM

## 2017-11-13 DIAGNOSIS — H6002 Abscess of left external ear: Secondary | ICD-10-CM

## 2017-11-13 DIAGNOSIS — Z111 Encounter for screening for respiratory tuberculosis: Secondary | ICD-10-CM | POA: Diagnosis not present

## 2017-11-13 DIAGNOSIS — E559 Vitamin D deficiency, unspecified: Secondary | ICD-10-CM

## 2017-11-13 DIAGNOSIS — Z125 Encounter for screening for malignant neoplasm of prostate: Secondary | ICD-10-CM

## 2017-11-13 MED ORDER — DOXYCYCLINE HYCLATE 100 MG PO CAPS: ORAL_CAPSULE | ORAL | 2 refills | Status: DC

## 2017-11-13 MED ORDER — SILDENAFIL CITRATE 100 MG PO TABS: ORAL_TABLET | ORAL | 11 refills | Status: DC

## 2017-11-14 LAB — CBC WITH DIFFERENTIAL/PLATELET
BASOS PCT: 0.8 %
Basophils Absolute: 48 cells/uL (ref 0–200)
EOS ABS: 90 {cells}/uL (ref 15–500)
Eosinophils Relative: 1.5 %
HCT: 47.9 % (ref 38.5–50.0)
Hemoglobin: 16.2 g/dL (ref 13.2–17.1)
Lymphs Abs: 1986 cells/uL (ref 850–3900)
MCH: 26.8 pg — AB (ref 27.0–33.0)
MCHC: 33.8 g/dL (ref 32.0–36.0)
MCV: 79.2 fL — ABNORMAL LOW (ref 80.0–100.0)
MONOS PCT: 6.9 %
MPV: 10.2 fL (ref 7.5–12.5)
Neutro Abs: 3462 cells/uL (ref 1500–7800)
Neutrophils Relative %: 57.7 %
PLATELETS: 248 10*3/uL (ref 140–400)
RBC: 6.05 10*6/uL — AB (ref 4.20–5.80)
RDW: 15.5 % — ABNORMAL HIGH (ref 11.0–15.0)
TOTAL LYMPHOCYTE: 33.1 %
WBC mixed population: 414 cells/uL (ref 200–950)
WBC: 6 10*3/uL (ref 3.8–10.8)

## 2017-11-14 LAB — BASIC METABOLIC PANEL WITH GFR
BUN: 13 mg/dL (ref 7–25)
CHLORIDE: 105 mmol/L (ref 98–110)
CO2: 25 mmol/L (ref 20–32)
Calcium: 9.5 mg/dL (ref 8.6–10.3)
Creat: 1.13 mg/dL (ref 0.60–1.35)
GFR, Est African American: 90 mL/min/{1.73_m2} (ref >=60)
GFR, Est Non African American: 78 mL/min/{1.73_m2} (ref >=60)
Glucose, Bld: 127 mg/dL — ABNORMAL HIGH (ref 65–99)
POTASSIUM: 4.3 mmol/L (ref 3.5–5.3)
Sodium: 138 mmol/L (ref 135–146)

## 2017-11-14 LAB — MICROALBUMIN / CREATININE URINE RATIO
Creatinine, Urine: 138 mg/dL (ref 20–320)
MICROALB UR: 0.6 mg/dL
Microalb Creat Ratio: 4 mcg/mg creat (ref <30)

## 2017-11-14 LAB — TSH: TSH: 0.65 m[IU]/L (ref 0.40–4.50)

## 2017-11-14 LAB — HEPATIC FUNCTION PANEL
AG Ratio: 2.1 (calc) (ref 1.0–2.5)
ALKALINE PHOSPHATASE (APISO): 44 U/L (ref 40–115)
ALT: 37 U/L (ref 9–46)
AST: 37 U/L (ref 10–40)
Albumin: 4.4 g/dL (ref 3.6–5.1)
BILIRUBIN INDIRECT: 0.4 mg/dL (ref 0.2–1.2)
Bilirubin, Direct: 0.1 mg/dL (ref 0.0–0.2)
GLOBULIN: 2.1 g/dL (ref 1.9–3.7)
TOTAL PROTEIN: 6.5 g/dL (ref 6.1–8.1)
Total Bilirubin: 0.5 mg/dL (ref 0.2–1.2)

## 2017-11-14 LAB — LIPID PANEL
CHOLESTEROL: 140 mg/dL (ref <200)
HDL: 23 mg/dL — ABNORMAL LOW (ref >40)
LDL Cholesterol (Calc): 81 mg/dL (calc)
Non-HDL Cholesterol (Calc): 117 mg/dL (calc) (ref <130)
Total CHOL/HDL Ratio: 6.1 (calc) — ABNORMAL HIGH (ref <5.0)
Triglycerides: 299 mg/dL — ABNORMAL HIGH (ref <150)

## 2017-11-14 LAB — URINALYSIS, ROUTINE W REFLEX MICROSCOPIC
BILIRUBIN URINE: NEGATIVE
Glucose, UA: NEGATIVE
Hgb urine dipstick: NEGATIVE
KETONES UR: NEGATIVE
Leukocytes, UA: NEGATIVE
NITRITE: NEGATIVE
Protein, ur: NEGATIVE
SPECIFIC GRAVITY, URINE: 1.021 (ref 1.001–1.03)
pH: 7 (ref 5.0–8.0)

## 2017-11-14 LAB — IRON, TOTAL/TOTAL IRON BINDING CAP
%SAT: 25 % (calc) (ref 15–60)
IRON: 70 ug/dL (ref 50–180)
TIBC: 278 ug/dL (ref 250–425)

## 2017-11-14 LAB — PSA: PSA: 0.5 ng/mL (ref <=4.0)

## 2017-11-14 LAB — VITAMIN D 25 HYDROXY (VIT D DEFICIENCY, FRACTURES): Vit D, 25-Hydroxy: 86 ng/mL (ref 30–100)

## 2017-11-14 LAB — TESTOSTERONE: TESTOSTERONE: 796 ng/dL (ref 250–827)

## 2017-11-14 LAB — MAGNESIUM: MAGNESIUM: 1.9 mg/dL (ref 1.5–2.5)

## 2017-11-14 LAB — VITAMIN B12: Vitamin B-12: 357 pg/mL (ref 200–1100)

## 2017-11-14 LAB — HEMOGLOBIN A1C
Hgb A1c MFr Bld: 5.8 % of total Hgb — ABNORMAL HIGH (ref <5.7)
MEAN PLASMA GLUCOSE: 120 (calc)
eAG (mmol/L): 6.6 (calc)

## 2017-12-23 ENCOUNTER — Ambulatory Visit: Payer: Self-pay | Admitting: Adult Health

## 2017-12-23 ENCOUNTER — Other Ambulatory Visit: Payer: Self-pay

## 2017-12-23 ENCOUNTER — Emergency Department
Admission: EM | Admit: 2017-12-23 | Discharge: 2017-12-23 | Disposition: A | Discharge status: Home / Self Care | Payer: 59 | Source: Home / Self Care | Attending: Family Medicine | Admitting: Family Medicine

## 2017-12-23 DIAGNOSIS — J069 Acute upper respiratory infection, unspecified: Secondary | ICD-10-CM | POA: Diagnosis not present

## 2017-12-23 DIAGNOSIS — B9789 Other viral agents as the cause of diseases classified elsewhere: Secondary | ICD-10-CM

## 2017-12-23 MED ORDER — PREDNISONE 20 MG PO TABS: ORAL_TABLET | ORAL | 0 refills | Status: DC

## 2017-12-23 MED ORDER — AZITHROMYCIN 250 MG PO TABS: ORAL_TABLET | ORAL | 0 refills | Status: DC

## 2017-12-23 NOTE — Discharge Instructions (Signed)
Take plain guaifenesin (1200mg  extended release tabs such as Mucinex) twice daily, with plenty of water, for cough and congestion.  May add Pseudoephedrine (30mg , one or two every 4 to 6 hours) for sinus congestion.  Get adequate rest.   May use Afrin nasal spray (or generic oxymetazoline) each morning for about 5 days and then discontinue.  Also recommend using saline nasal spray several times daily and saline nasal irrigation (AYR is a common brand).   Try warm salt water gargles for sore throat.  Stop all antihistamines for now, and other non-prescription cough/cold preparations. May take Delsym Cough Suppressant at bedtime for nighttime cough.  Begin Azithromycin if not improving about one week or if persistent fever develops   Follow-up with family doctor if not improving about10 days.

## 2017-12-23 NOTE — ED Provider Notes (Signed)
[facility]    CSN: [id number] Arrival date & time: 12/23/17  1036     History   Chief Complaint Chief Complaint  Patient presents with  . Cough  . Fever  . Nasal Congestion    HPI Mike Cohen is a 47 y.o. male.   Patient complains of four day history of typical cold-like symptoms developing over several days, including sinus congestion, headache, fatigue, chills, and cough. He had diarrhea yesterday, now resolved.  He has a history of springtime seasonal rhinitis, and occasional sinusitis.   The history is provided by the patient and the spouse.    Past Medical History:  Diagnosis Date  . Hyperlipidemia   . Hypertension   . Other testicular hypofunction   . Prediabetes     Patient Active Problem List   Diagnosis Date Noted  . BMI 36.46,  adult 09/19/2015  . Medication management 04/02/2014  . Vitamin D deficiency 04/02/2014  . OSA on CPAP 04/02/2014  . IBS 04/02/2014  . Hypertension   . Prediabetes   . Testosterone deficiency   . Hyperlipidemia, mixed     Past Surgical History:  Procedure Laterality Date  . ROTATOR CUFF REPAIR Right 2007  . SHOULDER ARTHROSCOPY Right 2000       Home Medications    Prior to Admission medications   Medication Sig Start Date End Date Taking? Authorizing Provider  azithromycin (ZITHROMAX Z-PAK) 250 MG tablet Take 2 tabs today; then begin one tab once daily for 4 more days. (Rx void after 12/31/17) 12/23/17   Kandra Nicolas, MD  Cholecalciferol (VITAMIN D PO) Take 2,000 Int'l Units by mouth daily.    [provider]  doxycycline (VIBRAMYCIN) 100 MG capsule Take 1 capsule 1 daily with food for skin infection 11/13/17   Unk Pinto, MD  fluticasone [facility]) 50 MCG/ACT nasal spray Place 2 sprays into both nostrils daily. 08/08/16   Forcucci, Courtney, PA-C  predniSONE (DELTASONE) 20 MG tablet Take one tab by mouth twice daily for 4 days, then one daily for 3 days. Take with food. 12/23/17   Kandra Nicolas, MD  sildenafil (VIAGRA) 100 MG tablet Take 1/2 to 1 tablet daily as needed for XXXX 11/13/17 11/13/18  Unk Pinto, MD  SYRINGE-NEEDLE, DISP, 3 ML (B-D 3CC LUER-LOK SYR 21GX1-1/2) 21G X 1-1/2" 3 ML MISC Test sugar once daily 06/14/15   Vicie Mutters, PA-C  testosterone cypionate (DEPOTESTOSTERONE CYPIONATE) 200 MG/ML injection INJECT 2 MLS INTO THE MUSCLE EVERY 2 WEEKS 06/21/17   Unk Pinto, MD    Family History Family History  Problem Relation Age of Onset  . Heart disease Father   . Hyperlipidemia Father   . Hypertension Father     Social History Social History   Tobacco Use  . Smoking status: Never Smoker  . Smokeless tobacco: Never Used  . Tobacco comment: occasional cigar  Substance Use Topics  . Alcohol use: Yes    Alcohol/week: 0.0 oz    Comment: occasional  . Drug use: No     Allergies   Patient has no known allergies.   Review of Systems Review of Systems No sore throat + cough No pleuritic pain No wheezing + nasal congestion + post-nasal drainage + sinus pain/pressure No itchy/red eyes No earache No hemoptysis No SOB ? fever, + chills No nausea No vomiting No abdominal pain + diarrhea, resolved No urinary symptoms No skin rash + fatigue + myalgias + headache Used OTC meds without relief   Physical  Exam Triage Vital Signs ED Triage Vitals  Enc Vitals Group     BP 12/23/17 1113 125/84     Pulse Rate 12/23/17 1113 86     Resp --      Temp 12/23/17 1113 98.2 F (36.8 C)     Temp Source 12/23/17 1113 Oral     SpO2 12/23/17 1113 96 %     Weight 12/23/17 1114 270 lb (122.5 kg)     Height 12/23/17 1114 5\' 9"  (1.753 m)     Head Circumference --      Peak Flow --      Pain Score 12/23/17 1114 0     Pain Loc --      Pain Edu? --      Excl. in [place]? --    No data found.  Updated Vital Signs BP 125/84 (BP Location: Right Arm)   Pulse 86   Temp 98.2 F (36.8 C) (Oral)   Ht 5\' 9"  (1.753 m)   Wt 270 lb (122.5 kg)   SpO2  96%   BMI 39.87 kg/m   Visual Acuity Right Eye Distance:   Left Eye Distance:   Bilateral Distance:    Right Eye Near:   Left Eye Near:    Bilateral Near:     Physical Exam Nursing notes and Vital Signs reviewed. Appearance:  Patient appears stated age, and in no acute distress Eyes:  Pupils are equal, round, and reactive to light and accomodation.  Extraocular movement is intact.  Conjunctivae are not inflamed  Ears:  Canals normal.  Tympanic membranes normal.  Nose:  Congested turbinates.  No sinus tenderness.   Pharynx:  Normal Neck:  Supple.  Enlarged posterior/lateral nodes are palpated bilaterally  Lungs:  Clear to auscultation.  Breath sounds are equal.  Moving air well. Heart:  Regular rate and rhythm without murmurs, rubs, or gallops.  Abdomen:  Nontender without masses or hepatosplenomegaly.  Bowel sounds are present.  No CVA or flank tenderness.  Extremities:  No edema.  Skin:  No rash present.    UC Treatments / Results  Labs (all labs ordered are listed, but only abnormal results are displayed) Labs Reviewed - No data to display  EKG  EKG Interpretation None       Radiology No results found.  Procedures Procedures (including critical care time)  Medications Ordered in UC Medications - No data to display   Initial Impression / Assessment and Plan / UC Course  I have reviewed the triage vital signs and the nursing notes.  Pertinent labs & imaging results that were available during my care of the patient were reviewed by me and considered in my medical decision making (see chart for details).    There is no evidence of bacterial infection today.   Begin prednisone burst/taper. Take plain guaifenesin (1200mg  extended release tabs such as Mucinex) twice daily, with plenty of water, for cough and congestion.  May add Pseudoephedrine (30mg , one or two every 4 to 6 hours) for sinus congestion.  Get adequate rest.   May use Afrin nasal spray (or generic  oxymetazoline) each morning for about 5 days and then discontinue.  Also recommend using saline nasal spray several times daily and saline nasal irrigation (AYR is a common brand).   Try warm salt water gargles for sore throat.  Stop all antihistamines for now, and other non-prescription cough/cold preparations. May take Delsym Cough Suppressant at bedtime for nighttime cough.  Begin Azithromycin if not improving about  one week or if persistent fever develops (Given a prescription to hold, with an expiration date)  Follow-up with family doctor if not improving about10 days.     Final Clinical Impressions(s) / UC Diagnoses   Final diagnoses:  Viral URI with cough    ED Discharge Orders        Ordered    predniSONE (DELTASONE) 20 MG tablet     12/23/17 1125    azithromycin (ZITHROMAX Z-PAK) 250 MG tablet     12/23/17 1126           Kandra Nicolas, MD 12/23/17 1134

## 2017-12-23 NOTE — ED Triage Notes (Signed)
Started Friday with a runny nose, cough, slight fever over the weekend.  Headache abd back/ chest pain when coughing.  Nose "feels like it is on fire".

## 2018-02-05 ENCOUNTER — Other Ambulatory Visit: Payer: Self-pay | Admitting: Internal Medicine

## 2018-02-10 NOTE — Progress Notes (Signed)
Assessment and Plan:  Essential hypertension - continue medications, DASH diet, exercise and monitor at home. Call if greater than 130/80.  -     CBC with Differential/Platelet -     BASIC METABOLIC PANEL WITH GFR -     Hepatic function panel -     TSH  Hyperlipidemia, mixed -continue medications, check lipids, decrease fatty foods, increase activity.  -     Lipid panel  Testosterone deficiency Shot 2 weeks ago, check lab -     Testosterone  Prediabetes Discussed disease progression and risks Discussed diet/exercise, weight management and risk modification -     Hemoglobin A1c  BMI 34 - follow up 3 months for progress monitoring - increase veggies, decrease carbs - long discussion about weight loss, diet, and exercise  Continue diet and meds as discussed. Further disposition pending results of labs. Future Appointments  Date Time Provider [place]  12/17/2018  2:00 PM Unk Pinto, MD [facility] None    HPI 47 y.o. male  presents for 3 month follow up with hypertension, hyperlipidemia, prediabetes and vitamin D.  He states 2-3 weeks ago was on zpak from UC, was doing better, but still has congestion at night. On mucinex only. No allergy pill.    His blood pressure has been controlled at home, today their BP is BP: 124/78.   He does workout. He denies chest pain, shortness of breath, dizziness.   He is not on cholesterol medication and denies myalgias. His cholesterol is at goal. The cholesterol last visit was:   Lab Results  Component Value Date   CHOL 140 11/13/2017   HDL 23 (L) 11/13/2017   LDLCALC 77 04/18/2017   TRIG 299 (H) 11/13/2017   CHOLHDL 6.1 (H) 11/13/2017     He has been working on diet and exercise for prediabetes, and denies foot ulcerations, hyperglycemia, hypoglycemia , increased appetite, nausea, paresthesia of the feet, polydipsia, polyuria, visual disturbances, vomiting and weight loss. Last A1C in the office was:  Lab Results   Component Value Date   HGBA1C 5.8 (H) 11/13/2017   Patient is on Vitamin D supplement.  Lab Results  Component Value Date   VD25OH 86 11/13/2017      BMI is Body mass index is 34.87 kg/m., he is working on diet and exercise. NOT AN ACTUAL WEIGHT. He does not eat out often. He snacks but denies emotional or bored. He eats 3 meals a day. He does work out, just started 2-3 times a week. Just water. He eats peanuts.  Wt Readings from Last 3 Encounters:  02/11/18 250 lb (113.4 kg)  12/23/17 270 lb (122.5 kg)  11/13/17 275 lb (124.7 kg)     Current Medications:  Current Outpatient Medications on File Prior to Visit  Medication Sig Dispense Refill  . Cholecalciferol (VITAMIN D PO) Take 2,000 Int'l Units by mouth daily.    . fluticasone (FLONASE) 50 MCG/ACT nasal spray Place 2 sprays into both nostrils daily. 16 g 0  . sildenafil (VIAGRA) 100 MG tablet Take 1/2 to 1 tablet daily as needed for XXXX 90 tablet 11  . SYRINGE-NEEDLE, DISP, 3 ML (B-D 3CC LUER-LOK SYR 21GX1-1/2) 21G X 1-1/2" 3 ML MISC Test sugar once daily 50 each 0  . testosterone cypionate (DEPOTESTOSTERONE CYPIONATE) 200 MG/ML injection INJECT 2MLS INTRAMUSCULARLY EVERY 2 WEEKS 10 mL 2  . doxycycline (VIBRAMYCIN) 100 MG capsule Take 1 capsule 1 daily with food for skin infection (Patient not taking: Reported on 02/11/2018) 15 capsule 2  No current facility-administered medications on file prior to visit.     Medical History:  Past Medical History:  Diagnosis Date  . Hyperlipidemia   . Hypertension   . Other testicular hypofunction   . Prediabetes     Allergies: No Known Allergies   Review of Systems:  Review of Systems  Constitutional: Negative for chills, fever and malaise/fatigue.  HENT: Negative for congestion, ear pain and sore throat.   Eyes: Negative.   Respiratory: Negative for cough, shortness of breath and wheezing.   Cardiovascular: Negative for chest pain, palpitations and leg swelling.   Gastrointestinal: Negative for abdominal pain, blood in stool, constipation, diarrhea and heartburn.  Genitourinary: Negative.   Skin: Negative.   Neurological: Negative for dizziness, sensory change, loss of consciousness and headaches.  Psychiatric/Behavioral: Negative for depression. The patient is not nervous/anxious and does not have insomnia.     Family history- Review and unchanged  Social history- Review and unchanged  Physical Exam: BP 124/78   Pulse 92   Temp (!) 97.5 F (36.4 C)   Resp 18   Ht 5\' 11"  (1.803 m)   Wt 250 lb (113.4 kg)   BMI 34.87 kg/m  Wt Readings from Last 3 Encounters:  02/11/18 250 lb (113.4 kg)  12/23/17 270 lb (122.5 kg)  11/13/17 275 lb (124.7 kg)    General Appearance: Well nourished well developed, in no apparent distress. Eyes: PERRLA, EOMs, conjunctiva no swelling or erythema ENT/Mouth: Ear canals normal without obstruction, swelling, erythma, discharge.  TMs normal bilaterally.  Oropharynx moist, clear, without exudate, or postoropharyngeal swelling. Neck: Supple, thyroid normal,no cervical adenopathy  Respiratory: Respiratory effort normal, Breath sounds clear A&P without rhonchi, wheeze, or rale.  No retractions, no accessory usage. Cardio: RRR with no MRGs. Brisk peripheral pulses without edema.  Abdomen: Soft, + BS,  Non tender, no guarding, rebound, hernias, masses. Musculoskeletal: Full ROM, 5/5 strength, Normal gait Skin: Warm, dry without rashes, lesions, ecchymosis.  Neuro: Awake and oriented X 3, Cranial nerves intact. Normal muscle tone, no cerebellar symptoms. Psych: Normal affect, Insight and Judgment appropriate.    Vicie Mutters, PA-C 2:46 PM [facility]

## 2018-02-11 ENCOUNTER — Ambulatory Visit: Payer: 59 | Admitting: Physician Assistant

## 2018-02-11 VITALS — BP 124/78 | HR 92 | Temp 97.5°F | Resp 18 | Ht 71.0 in | Wt 250.0 lb

## 2018-02-11 DIAGNOSIS — E349 Endocrine disorder, unspecified: Secondary | ICD-10-CM

## 2018-02-11 DIAGNOSIS — I10 Essential (primary) hypertension: Secondary | ICD-10-CM

## 2018-02-11 DIAGNOSIS — Z6834 Body mass index (BMI) 34.0-34.9, adult: Secondary | ICD-10-CM

## 2018-02-11 DIAGNOSIS — E559 Vitamin D deficiency, unspecified: Secondary | ICD-10-CM

## 2018-02-11 DIAGNOSIS — R7303 Prediabetes: Secondary | ICD-10-CM | POA: Diagnosis not present

## 2018-02-11 DIAGNOSIS — E782 Mixed hyperlipidemia: Secondary | ICD-10-CM | POA: Diagnosis not present

## 2018-02-11 DIAGNOSIS — Z79899 Other long term (current) drug therapy: Secondary | ICD-10-CM | POA: Diagnosis not present

## 2018-02-11 NOTE — Patient Instructions (Addendum)
Check out  Mini habits for weight loss book  2 apps for tracking food is myfitness pal  loseit OR can take picture of your food  Here are things you can do to help with this:  - Try the Flonase or Nasonex. Remember to spray each nostril twice towards the outer part of your eye.  Do not sniff but instead pinch your nose and tilt your head back to help the medicine get into your sinuses.  The best time to do this is at bedtime.Stop if you get blurred vision or nose bleeds.   -Please pick one of the over the counter allergy medications below and take it once daily for allergies.  It will also help with fluid behind ear drums. Claritin or loratadine cheapest but likely the weakest  Zyrtec or certizine at night because it can make you sleepy The strongest is allegra or fexafinadine  Cheapest at [employer], [employer], [employer]  -can use decongestant over the counter, please do not use if you have high blood pressure or certain heart conditions.   if worsening HA, changes vision/speech, imbalance, weakness go to the ER  Nuts are a healthy fat that can help you feel full HOWEVER they pack a lot of calories in a small amount. For weight loss, I suggest no more than 1 serving of nuts a day. A handful goes a long way. Here are some examples of the different types of nuts and the suggested serving a day.     8 Critical Weight-Loss Tips That Aren't Diet and Exercise  1. STARVE THE DISTRACTIONS  All too often when we eat, we're also multitasking: watching TV, answering emails, scrolling through social media. These habits are detrimental to having a strong, clear, healthy relationship with food, and they can hinder our ability to make dietary changes.  In order to truly focus on what you're eating, how much you're eating, why you're eating those specific foods and, most importantly, how those foods make you feel, you need to starve the distractions. That means when you eat, just eat. Focus on your food, the  process it went through to end up on your plate, where it came from and how it nourishes you. With this technique, you're more likely to finish a meal feeling satiated.  2.  CONSIDER WHAT YOU'RE NOT WILLING TO DO  This might sound counterintuitive, but it can help provide a "why" when motivation is waning. Declare, in writing, what you are unwilling to do, for example "I am unwilling to be the old dad who cannot play sports with my children".  So consider what you're not willing to accept, write it down, and keep it at the ready.  3.  STOP LABELING FOOD "GOOD" AND "BAD"  You've probably heard someone say they ate something "bad." Maybe you've even said it yourself.  The trouble with 'bad' foods isn't that they'll send you to the grave after a bite or two. The trouble comes when we eat excessive portions of really calorie-dense foods meal after meal, day after day.  Instead of labeling foods as good or bad, think about which foods you can eat a lot of, and which ones you should just eat a little of. Then, plan ways to eat the foods you really like in portions that fit with your overall goals. A good example of this would be having a slice of pizza alongside a club salad with chicken breast, avocado and a bit of dressing. This is vastly different than  3 slices of pizza, 4 breadsticks with cheese sauce and half of a liter of regular soda.  4.  BRUSH YOUR TEETH AFTER YOU EAT  Getting your mindset in order is important, but sometimes small habits can make a big difference. After eating, you still have the taste of food in their mouth, which often causes people to eat more even if they are full or engage in a nibble or two of dessert.  Brushing your teeth will remove the taste of food from your mouth, and the clean, minty freshness will serve as a cue that mealtime is over.  5.  FOCUS ON CROWDING NOT CUTTING  The most common first step during 'dieting' is to cut. We cut our portion sizes down, we cut  out 'bad' foods, we cut out entire food groups. This act of cutting puts [place] and our minds into scarcity mode.  When something is off-limits, even if you're able to avoid it for a while, you could end up bingeing on it later because you've gone so long without it. So, instead of cutting, focus on crowding. If you crowd your plate and fill it up with more foods like veggies and protein, it simply allows less room for the other stuff. In other words, shift your focus away from what you can't eat, and celebrate the foods that will help you reach your goals.  6.  TAKE TRACKING A STEP FURTHER  Track what you eat, when you ate it, how much you ate and how that food made you feel. Being completely honest with yourself and writing down every single thing that passes through your lips will help you start to notice that maybe you actually do snack, possibly take in more sugar than you thought, eat when you're bored rather than just hungry or maybe that you have a habit of snacking before bed while watching TV.  The difference from simply tracking your food intake is you're taking into account how food makes you feel, as well as what you're doing while you're eating. This is about becoming more mindful of what, when and why you eat.  7.  PRIORITIZE GOOD SLEEP  One of the strongest risk factors for being overweight is poor sleep. When you're feeling tired, you're more likely to choose unhealthy comfort foods and to skip your workout. Additionally, sleep deprivation may slow down your metabolism. [employer]! Therefore, sleeping 7-8 hours per night can help with weight loss without having to change your diet or increase your physical activity. And if you feel you snore and still wake up tired, talk with me about sleep apnea.  8.  SET ASIDE TIME TO DISCONNECT  Just get out there. Disconnect from the electronics and connect to the elements. Not only will this help reduce stress (a major factor in weight gain) by giving  your mind a break from the constant stimulation we've all become so accustomed to, but it may also reprogram your brain to connect with yourself and what you're feeling.  Veggies are great because you can eat a ton! They are low in calories, great to fill you up, and have a ton of vitamins, minerals, and protein.         When it comes to diets, agreement about the perfect plan isn't easy to find, even among the experts. Experts at the [place] developed an idea known as the Healthy Eating Plate. Just imagine a plate divided into logical, healthy portions.  The emphasis is  on diet quality:  Load up on vegetables and fruits - one-half of your plate: Aim for color and variety, and remember that potatoes don't count.  Go for whole grains - one-quarter of your plate: Whole wheat, barley, wheat berries, quinoa, oats, brown rice, and foods made with them. If you want pasta, go with whole wheat pasta.  Protein power - one-quarter of your plate: Fish, chicken, beans, and nuts are all healthy, versatile protein sources. Limit red meat.  The diet, however, does go beyond the plate, offering a few other suggestions.  Use healthy plant oils, such as olive, canola, soy, corn, sunflower and peanut. Check the labels, and avoid partially hydrogenated oil, which have unhealthy trans fats.  If you're thirsty, drink water. Coffee and tea are good in moderation, but skip sugary drinks and limit milk and dairy products to one or two daily servings.  The type of carbohydrate in the diet is more important than the amount. Some sources of carbohydrates, such as vegetables, fruits, whole grains, and beans-are healthier than others.  Finally, stay active.  Who Qualifies for Obesity Medications? Although everyone is hopeful for a fast and easy way to lose weight, nothing has been shown to replace a prudent, calorie-controlled diet along with behavior modification as a cornerstone for all  obesity treatments.  The next tool that can be used to achieve weight-loss and health improvement is medication. Pharmacotherapy may be offered to individuals affected by obesity who have failed to achieve weight-loss through diet and exercise alone. Currently there are several drugs that are approved by the FDA for weight-loss: phentermine products (Adipex-P or Suprenza)  lorcaserin HCI (Belviq) phentermine- topiramate ER (Qsymia)  Bupropion; Naltrexone ER (Contrave)  Saxenda (liraglutide), once a day weight loss shot Let's take a closer look at each of these medications and learn how they work:  Phentermine (Adipex-P or Suprenza) How does it work? Phentermine is a medication available by prescription that works on chemicals in the brain to decrease your appetite. It also has a mild stimulant component that adds extra energy. Phentermine is a pill that is taken once a day in the morning time. Tolerance to this medication can develop, so it can only be used for several months at a time. Common side effects are dry mouth, sleeplessness, constipation. Weight-loss: The average weight-loss is 4-5 percent of your weight after one-year. In a 200 pound person, this means about 10 pounds of weight-loss. Patients who receive phentermine can usually expect to see greater weight-loss than those who receive non-pharmacologic care, on average about 13 pounds difference over 12 weeks as reported in one study. Concerns: Due to its stimulant effect, a person's blood pressure and heart rate may increase when on this medication; therefore, you must be monitored closely by a physician who is experienced in prescribing this medication. It cannot be used in patients with some heart conditions (such as poorly controlled blood pressure), glaucoma (increased pressure in your eye), stroke or overactive thyroid. There is some concern for abuse, but this is minimal if the medication is appropriately used as directed by a  healthcare professional.  Lorcaserin (Belviq) How does it work? Lorcaserin was approved in June 2012 by the FDA and became commercially available in June 2013. It works by helping you feel full while eating less, and it works on the chemicals in your brain to help decrease your appetite. Weight-loss: In individuals who took the medication for one-year, it has been shown to have an average of 7 percent  weight-loss. In a 200 pound person, this would mean a 14 pound weight-loss. Blood sugar, cholesterol and blood pressure levels have also been shown to improve. Concerns: The most common side effects are headache, dizziness, fatigue, dry mouth, upper HFW:YOVZCHYI tract infection and nausea.  Response to therapy should be evaluated by week 12.  If a patient has not lost at least 5% of baseline body weigh  Phentermine-Topiramate ER (Qsymia) How does it work? This combination medication was approved by the FDA in July 2012. Topiramate is a medication used to treat seizures. It was found that a common side effect of this medication was weight-loss. Phentermine, as described in this brochure, helps to increase your energy and decrease your appetite. Weight-loss: Among individuals who took the highest does of Qsymia (15 mg phentermine and 92 mg of topiramate ER) for one-year, they achieved an average of 14.4 percent weight-loss. In a 200 pound person, a 14.4 percent weight-loss would mean a loss of 29 pounds. Cholesterol levels have also been shown to improve. Concerns: The most common side effects were dry mouth, constipation and pins-and-needle feeling in extremities. Qsymia should NOT be taken during pregnancy since Topiramate ER, a component of Qsymia, has been associated with an increased risk of birth defects.  Bupropion; Naltrexone ER (Contrave) How does it work? Works in two areas of your brain, hunger center and reward center to reduce hunger and cravings.  Weight loss In a 56 week trial  patients lost more than 5% of their body weight.  Concerns Most common side effects are dry mouth, constipation or diarrhea, headache.  Please take it with a full glass of water and low fat meal.    Follow-up Visits: Patients are given the opportunity to revisit a topic or obtain more information on an area of interest during follow-up visits. The frequency of and interval between follow-up visits is determined on a patient-by-patient basis. Frequent visits (every 3 to 4 weeks) are encouraged until initial weight-loss goals (5 to 10 percent of body weight) are achieved. At that point, less frequent visits are typically scheduled as needed for individual patients. However, since obesity is considered a chronic life-long problem for many individuals, periodic continual follow up is recommended.   Research has shown that weight-loss as low as 5 percent of initial body weight can lead to favorable improvements in blood pressure, cholesterol, glucose levels and insulin sensitivity. The risk of developing heart disease is reduced the most in patients who have impaired glucose tolerance, type 2 diabetes or high blood pressure.

## 2018-02-12 LAB — BASIC METABOLIC PANEL WITHOUT GFR
BUN/Creatinine Ratio: 11 (calc) (ref 6–22)
BUN: 15 mg/dL (ref 7–25)
CO2: 27 mmol/L (ref 20–32)
Calcium: 10 mg/dL (ref 8.6–10.3)
Chloride: 104 mmol/L (ref 98–110)
Creat: 1.4 mg/dL — ABNORMAL HIGH (ref 0.60–1.35)
GFR, Est African American: 69 mL/min/1.73m2 (ref >=60)
GFR, Est Non African American: 60 mL/min/1.73m2 (ref >=60)
Glucose, Bld: 82 mg/dL (ref 65–99)
Potassium: 4.3 mmol/L (ref 3.5–5.3)
Sodium: 139 mmol/L (ref 135–146)

## 2018-02-12 LAB — LIPID PANEL
Cholesterol: 151 mg/dL (ref <200)
HDL: 27 mg/dL — ABNORMAL LOW (ref >40)
LDL Cholesterol (Calc): 88 mg/dL (calc)
NON-HDL CHOLESTEROL (CALC): 124 mg/dL (ref <130)
Total CHOL/HDL Ratio: 5.6 (calc) — ABNORMAL HIGH (ref <5.0)
Triglycerides: 276 mg/dL — ABNORMAL HIGH (ref <150)

## 2018-02-12 LAB — TESTOSTERONE: Testosterone: 507 ng/dL (ref 250–827)

## 2018-02-12 LAB — CBC WITH DIFFERENTIAL/PLATELET
Basophils Absolute: 42 {cells}/uL (ref 0–200)
Basophils Relative: 0.8 %
Eosinophils Absolute: 191 {cells}/uL (ref 15–500)
Eosinophils Relative: 3.6 %
HCT: 48.2 % (ref 38.5–50.0)
Hemoglobin: 16.4 g/dL (ref 13.2–17.1)
Lymphs Abs: 1945 {cells}/uL (ref 850–3900)
MCH: 26.5 pg — ABNORMAL LOW (ref 27.0–33.0)
MCHC: 34 g/dL (ref 32.0–36.0)
MCV: 78 fL — ABNORMAL LOW (ref 80.0–100.0)
MPV: 10.3 fL (ref 7.5–12.5)
Monocytes Relative: 9.8 %
Neutro Abs: 2602 {cells}/uL (ref 1500–7800)
Neutrophils Relative %: 49.1 %
Platelets: 237 Thousand/uL (ref 140–400)
RBC: 6.18 Million/uL — ABNORMAL HIGH (ref 4.20–5.80)
RDW: 14.4 % (ref 11.0–15.0)
Total Lymphocyte: 36.7 %
WBC mixed population: 519 {cells}/uL (ref 200–950)
WBC: 5.3 Thousand/uL (ref 3.8–10.8)

## 2018-02-12 LAB — HEPATIC FUNCTION PANEL
AG RATIO: 2.1 (calc) (ref 1.0–2.5)
ALT: 36 U/L (ref 9–46)
AST: 49 U/L — AB (ref 10–40)
Albumin: 4.6 g/dL (ref 3.6–5.1)
Alkaline phosphatase (APISO): 48 U/L (ref 40–115)
BILIRUBIN DIRECT: 0.1 mg/dL (ref 0.0–0.2)
BILIRUBIN INDIRECT: 0.4 mg/dL (ref 0.2–1.2)
BILIRUBIN TOTAL: 0.5 mg/dL (ref 0.2–1.2)
Globulin: 2.2 g/dL (calc) (ref 1.9–3.7)
Total Protein: 6.8 g/dL (ref 6.1–8.1)

## 2018-02-12 LAB — HEMOGLOBIN A1C
HEMOGLOBIN A1C: 6.1 %{Hb} — AB (ref <5.7)
MEAN PLASMA GLUCOSE: 128 (calc)
eAG (mmol/L): 7.1 (calc)

## 2018-02-12 LAB — TSH: TSH: 0.59 m[IU]/L (ref 0.40–4.50)

## 2018-06-04 NOTE — Progress Notes (Signed)
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   Reminder to go to the ER if any CP, SOB, nausea, dizziness, severe HA, changes vision/speech, left arm numbness and tingling and jaw pain.  Cholesterol Currently at LDL goal; diet for trigs discussed, consider adding omega 3 supplement  Continue low cholesterol diet and exercise.  Check lipid panel.   Prediabetes Continue diet and exercise.  Perform daily foot/skin check, notify office of any concerning changes.  Check A1C  Obesity with co morbidities Long discussion about weight loss, diet, and exercise Recommended diet heavy in fruits and veggies and low in animal meats, cheeses, and dairy products, appropriate calorie intake Discussed ideal weight for height Focus on stress reduction as this is likely significantly limiting weight loss -  Will follow up in 3 months  Vitamin D Def At goal at last visit; continue supplementation to maintain goal of 70-100 Defer Vit D level  Hypogonadism - continue to monitor, states medication is helping with symptoms of low T.  Sildenafil PRN   Continue diet and meds as discussed. Further disposition pending results of labs. Discussed med's effects and SE's.   Over 30 minutes of exam, counseling, chart review, and critical decision making was performed.   Future Appointments  Date Time Provider [place]  12/17/2018  2:00 PM Unk Pinto, MD [facility] None    ----------------------------------------------------------------------------------------------------------------------  HPI 47 y.o. male  presents for 3 month follow up on hypertension,  cholesterol, prediabetes, obesity and vitamin D deficiency.   BMI is Body mass index is 36.26 kg/m., he has been working on diet and exercise, but admits he could be doing better. He reports he is changing positions and is under a lot of stress, also trying to hire a Materials engineer. He is trying to eat out less, bringing a packed lunch and fruit with him to work. Drinking more water. Not keeping soda in the house.  Wt Readings from Last 3 Encounters:  06/05/18 260 lb (117.9 kg)  02/11/18 250 lb (113.4 kg)  12/23/17 270 lb (122.5 kg)   His blood pressure has been controlled at home, today their BP is BP: 124/90  He does workout. He denies chest pain, shortness of breath, dizziness.   He is not on cholesterol medication and denies myalgias. His LDL cholesterol is at goal, though trigs remain elevated. The cholesterol last visit was:   Lab Results  Component Value Date   CHOL 151 02/11/2018   HDL 27 (L) 02/11/2018   LDLCALC 88 02/11/2018   TRIG 276 (H) 02/11/2018   CHOLHDL 5.6 (H) 02/11/2018    He has been working on diet and exercise for prediabetes, and denies foot ulcerations, increased appetite, nausea, paresthesia of the feet, polydipsia, polyuria, visual disturbances, vomiting and weight loss. Last A1C in the office was:  Lab Results  Component Value Date   HGBA1C 6.1 (H) 02/11/2018   Patient is on Vitamin D supplement.   Lab Results  Component Value Date   VD25OH 69 11/13/2017     He has a history of testosterone deficiency and is on testosterone replacement, last shot 5 days ago. He states that the testosterone helps with his energy, libido,  muscle mass. Lab Results  Component Value Date   TESTOSTERONE 507 02/11/2018      Current Medications:  Current Outpatient Medications on File Prior to Visit  Medication Sig  . Cholecalciferol (VITAMIN D PO) Take 2,000 Int'l Units by mouth daily.  . fluticasone (FLONASE) 50 MCG/ACT nasal spray Place 2 sprays into both nostrils daily.   . sildenafil (VIAGRA) 100 MG tablet Take 1/2 to 1 tablet daily as needed for XXXX  . SYRINGE-NEEDLE, DISP, 3 ML (B-D 3CC LUER-LOK SYR 21GX1-1/2) 21G X 1-1/2" 3 ML MISC Test sugar once daily  . testosterone cypionate (DEPOTESTOSTERONE CYPIONATE) 200 MG/ML injection INJECT 2MLS INTRAMUSCULARLY EVERY 2 WEEKS   No current facility-administered medications on file prior to visit.      Allergies: No Known Allergies   Medical History:  Past Medical History:  Diagnosis Date  . Hyperlipidemia   . Hypertension   . Other testicular hypofunction   . Prediabetes    Family history- Reviewed and unchanged Social history- Reviewed and unchanged   Review of Systems:  Review of Systems  Constitutional: Negative for malaise/fatigue and weight loss.  HENT: Negative for hearing loss and tinnitus.   Eyes: Negative for blurred vision and double vision.  Respiratory: Negative for cough, shortness of breath and wheezing.   Cardiovascular: Negative for chest pain, palpitations, orthopnea, claudication and leg swelling.  Gastrointestinal: Negative for abdominal pain, blood in stool, constipation, diarrhea, heartburn, melena, nausea and vomiting.  Genitourinary: Negative.   Musculoskeletal: Negative for joint pain and myalgias.  Skin: Negative for rash.  Neurological: Negative for dizziness, tingling, sensory change, weakness and headaches.  Endo/Heme/Allergies: Negative for polydipsia.  Psychiatric/Behavioral: Negative.   All other systems reviewed and are negative.   Physical Exam: BP 124/90   Pulse 86   Temp 97.7 F (36.5 C)   Resp 16   Ht 5\' 11"  (1.803 m)   Wt 260 lb (117.9 kg)   SpO2 97%   BMI 36.26 kg/m  Wt Readings from Last 3 Encounters:  06/05/18 260 lb (117.9 kg)  02/11/18 250 lb (113.4 kg)  12/23/17 270 lb (122.5 kg)   General Appearance: Well nourished, in no apparent distress. Eyes: PERRLA, EOMs, conjunctiva no swelling or erythema Sinuses: No Frontal/maxillary  tenderness ENT/Mouth: Ext aud canals clear, TMs without erythema, bulging. No erythema, swelling, or exudate on post pharynx.  Tonsils not swollen or erythematous. Hearing normal.  Neck: Supple, thyroid normal.  Respiratory: Respiratory effort normal, BS equal bilaterally without rales, rhonchi, wheezing or stridor.  Cardio: RRR with no MRGs. Brisk peripheral pulses without edema.  Abdomen: Soft, + BS.  Non tender, no guarding, rebound, hernias, masses. Lymphatics: Non tender without lymphadenopathy.  Musculoskeletal: Full ROM, 5/5 strength, Normal gait Skin: Warm, dry without rashes, lesions, ecchymosis.  Neuro: Cranial nerves intact. No cerebellar symptoms.  Psych: Awake and oriented X 3, normal affect, Insight and Judgment appropriate.    Izora Ribas, NP 2:42 PM [facility]

## 2018-06-05 ENCOUNTER — Encounter: Payer: Self-pay | Admitting: Adult Health

## 2018-06-05 ENCOUNTER — Ambulatory Visit: Payer: 59 | Admitting: Adult Health

## 2018-06-05 VITALS — BP 124/90 | HR 86 | Temp 97.7°F | Resp 16 | Ht 71.0 in | Wt 260.0 lb

## 2018-06-05 DIAGNOSIS — E559 Vitamin D deficiency, unspecified: Secondary | ICD-10-CM | POA: Diagnosis not present

## 2018-06-05 DIAGNOSIS — E349 Endocrine disorder, unspecified: Secondary | ICD-10-CM | POA: Diagnosis not present

## 2018-06-05 DIAGNOSIS — I10 Essential (primary) hypertension: Secondary | ICD-10-CM | POA: Diagnosis not present

## 2018-06-05 DIAGNOSIS — R7303 Prediabetes: Secondary | ICD-10-CM

## 2018-06-05 DIAGNOSIS — E782 Mixed hyperlipidemia: Secondary | ICD-10-CM

## 2018-06-05 DIAGNOSIS — Z79899 Other long term (current) drug therapy: Secondary | ICD-10-CM

## 2018-06-05 DIAGNOSIS — E669 Obesity, unspecified: Secondary | ICD-10-CM

## 2018-06-05 NOTE — Patient Instructions (Signed)
Aim for 7+ servings of fruits and vegetables daily  80+ fluid ounces of water or unsweet tea for healthy kidneys  Limit alcohol intake, avoid smoking  Limit animal fats in diet for cholesterol and heart health - choose grass fed whenever available  Aim for low stress - take time to unwind and care for your mental health  Aim for 150 min of moderate intensity exercise weekly for heart health, and weights twice weekly for bone health  Aim for 7-9 hours of sleep daily    Monitor your blood pressure at home  Go to the ER if any CP, SOB, nausea, dizziness, severe HA, changes vision/speech  Due to a recent study, SPRINT, we have changed our goal for the systolic or top blood pressure number. Ideally we want your top number at 120.  In the [facility] Trial, 5000 people were randomized to a goal BP of 120 and 5000 people were randomized to a goal BP of less than 140. The patients with the goal BP at 120 had LESS DEMENTIA, LESS HEART ATTACKS, AND LESS STROKES, AS WELL AS OVERALL DECREASED MORTALITY OR DEATH RATE.   If you are willing, our goal BP is the top number of 120.  Your most recent BP: BP: 124/90   Take your medications faithfully as instructed. Maintain a healthy weight. Get at least 150 minutes of aerobic exercise per week. Minimize salt intake. Minimize alcohol intake  DASH Eating Plan DASH stands for "Dietary Approaches to Stop Hypertension." The DASH eating plan is a healthy eating plan that has been shown to reduce high blood pressure (hypertension). Additional health benefits may include reducing the risk of type 2 diabetes mellitus, heart disease, and stroke. The DASH eating plan may also help with weight loss. WHAT DO I NEED TO KNOW ABOUT THE DASH EATING PLAN? For the DASH eating plan, you will follow these general guidelines:  Choose foods with a percent daily value for sodium of less than 5% (as listed on the food label).  Use salt-free seasonings or herbs instead of  table salt or sea salt.  Check with your health care provider or pharmacist before using salt substitutes.  Eat lower-sodium products, often labeled as "lower sodium" or "no salt added."  Eat fresh foods.  Eat more vegetables, fruits, and low-fat dairy products.  Choose whole grains. Look for the word "whole" as the first word in the ingredient list.  Choose fish and skinless chicken or [place] more often than red meat. Limit fish, poultry, and meat to 6 oz (170 g) each day.  Limit sweets, desserts, sugars, and sugary drinks.  Choose heart-healthy fats.  Limit cheese to 1 oz (28 g) per day.  Eat more home-cooked food and less restaurant, buffet, and fast food.  Limit fried foods.  Cook foods using methods other than frying.  Limit canned vegetables. If you do use them, rinse them well to decrease the sodium.  When eating at a restaurant, ask that your food be prepared with less salt, or no salt if possible. WHAT FOODS CAN I EAT? Seek help from a dietitian for individual calorie needs. Grains Whole grain or whole wheat bread. Brown rice. Whole grain or whole wheat pasta. Quinoa, bulgur, and whole grain cereals. Low-sodium cereals. Corn or whole wheat flour tortillas. Whole grain cornbread. Whole grain crackers. Low-sodium crackers. Vegetables Fresh or frozen vegetables (raw, steamed, roasted, or grilled). Low-sodium or reduced-sodium tomato and vegetable juices. Low-sodium or reduced-sodium tomato sauce and paste. Low-sodium or reduced-sodium canned  vegetables.  Fruits All fresh, canned (in natural juice), or frozen fruits. Meat and Other Protein Products Ground beef (85% or leaner), grass-fed beef, or beef trimmed of fat. Skinless chicken or [place]. Ground chicken or [place]. Pork trimmed of fat. All fish and seafood. Eggs. Dried beans, peas, or lentils. Unsalted nuts and seeds. Unsalted canned beans. Dairy Low-fat dairy products, such as skim or 1% milk, 2% or reduced-fat  cheeses, low-fat ricotta or cottage cheese, or plain low-fat yogurt. Low-sodium or reduced-sodium cheeses. Fats and Oils Tub margarines without trans fats. Light or reduced-fat mayonnaise and salad dressings (reduced sodium). Avocado. Safflower, olive, or canola oils. Natural peanut or almond butter. Other Unsalted popcorn and pretzels. The items listed above may not be a complete list of recommended foods or beverages. Contact your dietitian for more options. WHAT FOODS ARE NOT RECOMMENDED? Grains White bread. White pasta. White rice. Refined cornbread. Bagels and croissants. Crackers that contain trans fat. Vegetables Creamed or fried vegetables. Vegetables in a cheese sauce. Regular canned vegetables. Regular canned tomato sauce and paste. Regular tomato and vegetable juices. Fruits Dried fruits. Canned fruit in light or heavy syrup. Fruit juice. Meat and Other Protein Products Fatty cuts of meat. Ribs, chicken wings, bacon, sausage, bologna, salami, chitterlings, fatback, hot dogs, bratwurst, and packaged luncheon meats. Salted nuts and seeds. Canned beans with salt. Dairy Whole or 2% milk, cream, half-and-half, and cream cheese. Whole-fat or sweetened yogurt. Full-fat cheeses or blue cheese. Nondairy creamers and whipped toppings. Processed cheese, cheese spreads, or cheese curds. Condiments Onion and garlic salt, seasoned salt, table salt, and sea salt. Canned and packaged gravies. Worcestershire sauce. Tartar sauce. Barbecue sauce. Teriyaki sauce. Soy sauce, including reduced sodium. Steak sauce. Fish sauce. Oyster sauce. Cocktail sauce. Horseradish. Ketchup and mustard. Meat flavorings and tenderizers. Bouillon cubes. Hot sauce. Tabasco sauce. Marinades. Taco seasonings. Relishes. Fats and Oils Butter, stick margarine, lard, shortening, ghee, and bacon fat. Coconut, palm kernel, or palm oils. Regular salad dressings. Other Pickles and olives. Salted popcorn and pretzels. The items  listed above may not be a complete list of foods and beverages to avoid. Contact your dietitian for more information. WHERE CAN I FIND MORE INFORMATION? National Heart, Lung, and Blood Institute: travelstabloid.com Document Released: 11/15/2011 Document Revised: 04/12/2014 Document Reviewed: 09/30/2013 [facility] Patient Information 2015 [place], [place]. This information is not intended to replace advice given to you by your health care provider. Make sure you discuss any questions you have with your health care provider.

## 2018-06-06 ENCOUNTER — Encounter: Payer: Self-pay | Admitting: Adult Health

## 2018-06-06 LAB — COMPLETE METABOLIC PANEL WITH GFR
AG Ratio: 1.8 (calc) (ref 1.0–2.5)
ALBUMIN MSPROF: 4.6 g/dL (ref 3.6–5.1)
ALKALINE PHOSPHATASE (APISO): 47 U/L (ref 40–115)
ALT: 46 U/L (ref 9–46)
AST: 49 U/L — ABNORMAL HIGH (ref 10–40)
BILIRUBIN TOTAL: 0.5 mg/dL (ref 0.2–1.2)
BUN: 14 mg/dL (ref 7–25)
CHLORIDE: 101 mmol/L (ref 98–110)
CO2: 26 mmol/L (ref 20–32)
Calcium: 9.9 mg/dL (ref 8.6–10.3)
Creat: 1.34 mg/dL (ref 0.60–1.35)
GFR, EST AFRICAN AMERICAN: 73 mL/min/{1.73_m2} (ref >=60)
GFR, Est Non African American: 63 mL/min/{1.73_m2} (ref >=60)
GLOBULIN: 2.5 g/dL (ref 1.9–3.7)
Glucose, Bld: 97 mg/dL (ref 65–99)
Potassium: 4.4 mmol/L (ref 3.5–5.3)
SODIUM: 136 mmol/L (ref 135–146)
TOTAL PROTEIN: 7.1 g/dL (ref 6.1–8.1)

## 2018-06-06 LAB — LIPID PANEL
Cholesterol: 169 mg/dL (ref <200)
HDL: 26 mg/dL — ABNORMAL LOW (ref >40)
LDL Cholesterol (Calc): 104 mg/dL (calc) — ABNORMAL HIGH
Non-HDL Cholesterol (Calc): 143 mg/dL (calc) — ABNORMAL HIGH (ref <130)
Total CHOL/HDL Ratio: 6.5 (calc) — ABNORMAL HIGH (ref <5.0)
Triglycerides: 293 mg/dL — ABNORMAL HIGH (ref <150)

## 2018-06-06 LAB — HEMOGLOBIN A1C
HEMOGLOBIN A1C: 5.6 %{Hb} (ref <5.7)
MEAN PLASMA GLUCOSE: 114 (calc)
eAG (mmol/L): 6.3 (calc)

## 2018-06-06 LAB — CBC WITH DIFFERENTIAL/PLATELET
BASOS ABS: 40 {cells}/uL (ref 0–200)
Basophils Relative: 0.7 %
EOS ABS: 194 {cells}/uL (ref 15–500)
Eosinophils Relative: 3.4 %
HEMATOCRIT: 51.4 % — AB (ref 38.5–50.0)
HEMOGLOBIN: 17.2 g/dL — AB (ref 13.2–17.1)
Lymphs Abs: 2217 cells/uL (ref 850–3900)
MCH: 26.6 pg — AB (ref 27.0–33.0)
MCHC: 33.5 g/dL (ref 32.0–36.0)
MCV: 79.4 fL — AB (ref 80.0–100.0)
MPV: 10.3 fL (ref 7.5–12.5)
Monocytes Relative: 8.5 %
NEUTROS PCT: 48.5 %
Neutro Abs: 2765 cells/uL (ref 1500–7800)
Platelets: 266 10*3/uL (ref 140–400)
RBC: 6.47 10*6/uL — ABNORMAL HIGH (ref 4.20–5.80)
RDW: 14.7 % (ref 11.0–15.0)
Total Lymphocyte: 38.9 %
WBC: 5.7 10*3/uL (ref 3.8–10.8)
WBCMIX: 485 {cells}/uL (ref 200–950)

## 2018-06-06 LAB — TSH: TSH: 0.85 m[IU]/L (ref 0.40–4.50)

## 2018-09-27 ENCOUNTER — Other Ambulatory Visit: Payer: Self-pay | Admitting: Internal Medicine

## 2018-12-16 ENCOUNTER — Encounter: Payer: Self-pay | Admitting: Internal Medicine

## 2018-12-16 NOTE — Patient Instructions (Signed)

## 2018-12-16 NOTE — Progress Notes (Signed)
[facility]   Unk Pinto, M.D.     Uvaldo Bristle. Silverio Lay, P.A.-C Liane Comber, [place]                [place], [place] [address] Telephone [phone] Telefax [phone] Annual  Screening/Preventative Visit  & Comprehensive Evaluation & Examination     This very nice 48 y.o. MBM  presents for a Screening /Preventative Visit & comprehensive evaluation and management of multiple medical co-morbidities.  Patient has been followed for HTN, HLD, Morbid Obesity,  Prediabetes and Vitamin D Deficiency. Patient also expresses concern re: recurrent boils at the nape of his neck that have occasionally bleed.      Patient has been followed expectantly since 2007 for labile HTN. Patient's BP has been controlled at home.  Today's BP is at goal - 122/86. Patient denies any cardiac symptoms as chest pain, palpitations, shortness of breath, dizziness or ankle swelling.     Patient's hyperlipidemia is controlled with diet and medications. Patient denies myalgias or other medication SE's. Last lipids were not at goal: Lab Results  Component Value Date   CHOL 169 06/05/2018   HDL 26 (L) 06/05/2018   LDLCALC 104 (H) 06/05/2018   TRIG 293 (H) 06/05/2018   CHOLHDL 6.5 (H) 06/05/2018      Patient has Morbid Obesity (BMI 36+) and hx/o prediabetes (A1c 6.5% /2012 then 6.1% in 2012 & 2016) and patient denies reactive hypoglycemic symptoms, visual blurring, diabetic polys or paresthesias. Last A1c was Normal & at goal: Lab Results  Component Value Date   HGBA1C 5.6 06/05/2018        Patient is on Depo-Testosterone parenteral replacement injection for hx/o low Testosterone ("252" / 2010 & "117" / 2012) with improved stamina.                                                        Finally, patient has history of Vitamin D Deficiency ("9" / 2009)  and last vitamin D was at goal: Lab Results  Component  Value Date   VD25OH 86 11/13/2017   Current Outpatient Medications on File Prior to Visit  Medication Sig  . aspirin EC 81 MG tablet Take 81 mg by mouth daily.  . Cholecalciferol (VITAMIN D PO) Take 2,000 Int'l Units by mouth daily.  . Multiple Vitamin (MULTIVITAMIN) tablet Take 1 tablet by mouth daily.  Marland Kitchen testosterone cypio 200 MG/ML inj INJECT 2 MILLILITERS INTRAMUSCULARLY EVERY 2 WEEKS  . sildenafil  100 MG tablet Take 1/2 to 1 tablet daily as needed for XXXX   Past Medical History:  Diagnosis Date  . Hyperlipidemia   . Hypertension   . Other testicular hypofunction   . Prediabetes    Health Maintenance  Topic Date Due  . HIV Screening  09/26/1986  . INFLUENZA VACCINE  07/10/2018  . TETANUS/TDAP  09/18/2025   Immunization History  Administered Date(s) Administered  . Influenza Inj Mdck Quad With Preservative 11/04/2017  . Influenza Split 09/22/2014, 09/19/2015, 09/12/2016  . Influenza-Unspecified 09/09/2018  . PPD Test 09/19/2015, 10/17/2016, 11/13/2017  . Td 10/13/2002  . Tdap 09/19/2015   Past Surgical History:  Procedure Laterality Date  . ROTATOR CUFF REPAIR  Right 2007  . SHOULDER ARTHROSCOPY Right 2000   Family History  Problem Relation Age of Onset  . Heart disease Father   . Hyperlipidemia Father   . Hypertension Father    Social History   Socioeconomic History  . Marital status: Married    Spouse name: Judeen Hammans  . Number of children: Not on file  Occupational History                                                                                          . Policeman  Tobacco Use  . Smoking status: Never Smoker  . Smokeless tobacco: Never Used  . Tobacco comment: occasional cigar  Substance and Sexual Activity  . Alcohol use: Yes    Alcohol/week: 0.0 standard drinks    Comment: occasional  . Drug use: No  . Sexual activity: Not on file    ROS Constitutional: Denies fever, chills, weight loss/gain, headaches, insomnia,  night sweats or change in  appetite. Does c/o fatigue. Eyes: Denies redness, blurred vision, diplopia, discharge, itchy or watery eyes.  ENT: Denies discharge, congestion, post nasal drip, epistaxis, sore throat, earache, hearing loss, dental pain, Tinnitus, Vertigo, Sinus pain or snoring.  Cardio: Denies chest pain, palpitations, irregular heartbeat, syncope, dyspnea, diaphoresis, orthopnea, PND, claudication or edema Respiratory: denies cough, dyspnea, DOE, pleurisy, hoarseness, laryngitis or wheezing.  Gastrointestinal: Denies dysphagia, heartburn, reflux, water brash, pain, cramps, nausea, vomiting, bloating, diarrhea, constipation, hematemesis, melena, hematochezia, jaundice or hemorrhoids Genitourinary: Denies dysuria, frequency, urgency, nocturia, hesitancy, discharge, hematuria or flank pain Musculoskeletal: Denies arthralgia, myalgia, stiffness, Jt. Swelling, pain, limp or strain/sprain. Denies Falls. Skin: Denies puritis, rash, hives, warts, acne, eczema or change in skin lesion Neuro: No weakness, tremor, incoordination, spasms, paresthesia or pain Psychiatric: Denies confusion, memory loss or sensory loss. Denies Depression. Endocrine: Denies change in weight, skin, hair change, nocturia, and paresthesia, diabetic polys, visual blurring or hyper / hypo glycemic episodes.  Heme/Lymph: No excessive bleeding, bruising or enlarged lymph nodes.  Physical Exam  BP 122/86   Pulse 88   Temp (!) 97 F (36.1 C)   Resp 18   Ht 5\' 11"  (1.803 m)   Wt 262 lb (118.8 kg)   BMI 36.54 kg/m   General Appearance: Well nourished and well groomed and in no apparent distress.  Eyes: PERRLA, EOMs, conjunctiva no swelling or erythema, normal fundi and vessels. Sinuses: No frontal/maxillary tenderness ENT/Mouth: EACs patent / TMs  nl. Nares clear without erythema, swelling, mucoid exudates. Oral hygiene is good. No erythema, swelling, or exudate. Tongue normal, non-obstructing. Tonsils not swollen or erythematous. Hearing  normal.  Neck: Supple, thyroid not palpable. No bruits, nodes or JVD. There is an area of sub-cutaneous firm induration across the lower skin fold at rhe nape of the neck. No discrete fluctuant ares are noted.  Respiratory: Respiratory effort normal.  BS equal and clear bilateral without rales, rhonci, wheezing or stridor. Cardio: Heart sounds are normal with regular rate and rhythm and no murmurs, rubs or gallops. Peripheral pulses are normal and equal bilaterally without edema. No aortic or femoral bruits. Chest: symmetric with normal excursions and percussion.  Abdomen: Soft,  with Nl bowel sounds. Nontender, no guarding, rebound, hernias, masses, or organomegaly.  Lymphatics: Non tender without lymphadenopathy.  Musculoskeletal: Full ROM all peripheral extremities, joint stability, 5/5 strength, and normal gait. Skin: Warm and dry without rashes, lesions, cyanosis, clubbing or  ecchymosis.  Neuro: Cranial nerves intact, reflexes equal bilaterally. Normal muscle tone, no cerebellar symptoms. Sensation intact.  Pysch: Alert and oriented X 3 with normal affect, insight and judgment appropriate.   Assessment and Plan  1. Annual Preventative/Screening Exam   2. Essential hypertension  - EKG 12-Lead - Urinalysis, Routine w reflex microscopic - Microalbumin / creatinine urine ratio - CBC with Differential/Platelet - COMPLETE METABOLIC PANEL WITH GFR - Magnesium - TSH  3. Hyperlipidemia, mixed  - EKG 12-Lead - Lipid panel - TSH  4. Prediabetes  - EKG 12-Lead - Hemoglobin A1c - Insulin, random  5. Vitamin D deficiency  - VITAMIN D 25 Hydroxy  6. Testosterone deficiency  - Testosterone  7. Screening for colorectal cancer  - POC Hemoccult Bld/Stl   8. Prostate cancer screening  - PSA  9. Screening examination for pulmonary tuberculosis  - TB Skin Test  10. Screening for ischemic heart disease  - EKG 12-Lead - Lipid panel  11. FHx: heart disease  - EKG  12-Lead  12. OSA on CPAP   13. Class 2 severe obesity due to excess calories with serious comorbidity and body mass index (BMI) of 36.0 to 36.9 in adult ([place])   14. Fatigue, unspecified type  - Iron,Total/Total Iron Binding Cap - Vitamin B12  15. 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       Patient was counseled in prudent diet, weight control to achieve/maintain BMI less than 25, BP monitoring, regular exercise and medications as discussed.  Discussed med effects and SE's. Routine screening labs and tests as requested with regular follow-up as recommended. Over 40 minutes of exam, counseling, chart review and high complex critical decision making was performed

## 2018-12-17 ENCOUNTER — Ambulatory Visit: Payer: 59 | Admitting: Internal Medicine

## 2018-12-17 VITALS — BP 122/86 | HR 88 | Temp 97.0°F | Resp 18 | Ht 71.0 in | Wt 262.0 lb

## 2018-12-17 DIAGNOSIS — Z111 Encounter for screening for respiratory tuberculosis: Secondary | ICD-10-CM | POA: Diagnosis not present

## 2018-12-17 DIAGNOSIS — Z0000 Encounter for general adult medical examination without abnormal findings: Secondary | ICD-10-CM

## 2018-12-17 DIAGNOSIS — Z0001 Encounter for general adult medical examination with abnormal findings: Secondary | ICD-10-CM

## 2018-12-17 DIAGNOSIS — I10 Essential (primary) hypertension: Secondary | ICD-10-CM | POA: Diagnosis not present

## 2018-12-17 DIAGNOSIS — R7303 Prediabetes: Secondary | ICD-10-CM

## 2018-12-17 DIAGNOSIS — Z125 Encounter for screening for malignant neoplasm of prostate: Secondary | ICD-10-CM

## 2018-12-17 DIAGNOSIS — R5383 Other fatigue: Secondary | ICD-10-CM

## 2018-12-17 DIAGNOSIS — Z1211 Encounter for screening for malignant neoplasm of colon: Secondary | ICD-10-CM

## 2018-12-17 DIAGNOSIS — Z136 Encounter for screening for cardiovascular disorders: Secondary | ICD-10-CM

## 2018-12-17 DIAGNOSIS — Z8249 Family history of ischemic heart disease and other diseases of the circulatory system: Secondary | ICD-10-CM

## 2018-12-17 DIAGNOSIS — E559 Vitamin D deficiency, unspecified: Secondary | ICD-10-CM

## 2018-12-17 DIAGNOSIS — Z79899 Other long term (current) drug therapy: Secondary | ICD-10-CM

## 2018-12-17 DIAGNOSIS — E349 Endocrine disorder, unspecified: Secondary | ICD-10-CM

## 2018-12-17 DIAGNOSIS — Z1212 Encounter for screening for malignant neoplasm of rectum: Secondary | ICD-10-CM

## 2018-12-17 DIAGNOSIS — G4733 Obstructive sleep apnea (adult) (pediatric): Secondary | ICD-10-CM

## 2018-12-17 DIAGNOSIS — Z9989 Dependence on other enabling machines and devices: Secondary | ICD-10-CM

## 2018-12-17 DIAGNOSIS — E782 Mixed hyperlipidemia: Secondary | ICD-10-CM

## 2018-12-17 DIAGNOSIS — Z6836 Body mass index (BMI) 36.0-36.9, adult: Secondary | ICD-10-CM

## 2018-12-17 MED ORDER — TOPIRAMATE 25 MG PO TABS: ORAL_TABLET | ORAL | 1 refills | Status: DC

## 2018-12-17 MED ORDER — PHENTERMINE HCL 37.5 MG PO TABS: ORAL_TABLET | ORAL | 5 refills | Status: DC

## 2018-12-17 MED ORDER — CEPHALEXIN 500 MG PO CAPS: ORAL_CAPSULE | ORAL | 5 refills | Status: DC

## 2018-12-18 LAB — CBC WITH DIFFERENTIAL/PLATELET
Absolute Monocytes: 400 cells/uL (ref 200–950)
BASOS ABS: 20 {cells}/uL (ref 0–200)
Basophils Relative: 0.4 %
EOS ABS: 190 {cells}/uL (ref 15–500)
Eosinophils Relative: 3.8 %
HCT: 47.6 % (ref 38.5–50.0)
Hemoglobin: 16.1 g/dL (ref 13.2–17.1)
Lymphs Abs: 1950 cells/uL (ref 850–3900)
MCH: 26.1 pg — ABNORMAL LOW (ref 27.0–33.0)
MCHC: 33.8 g/dL (ref 32.0–36.0)
MCV: 77 fL — ABNORMAL LOW (ref 80.0–100.0)
MPV: 10.2 fL (ref 7.5–12.5)
Monocytes Relative: 8 %
Neutro Abs: 2440 cells/uL (ref 1500–7800)
Neutrophils Relative %: 48.8 %
Platelets: 214 10*3/uL (ref 140–400)
RBC: 6.18 10*6/uL — ABNORMAL HIGH (ref 4.20–5.80)
RDW: 14.4 % (ref 11.0–15.0)
Total Lymphocyte: 39 %
WBC: 5 10*3/uL (ref 3.8–10.8)

## 2018-12-18 LAB — COMPLETE METABOLIC PANEL WITH GFR
AG Ratio: 2.1 (calc) (ref 1.0–2.5)
ALKALINE PHOSPHATASE (APISO): 45 U/L (ref 40–115)
ALT: 33 U/L (ref 9–46)
AST: 35 U/L (ref 10–40)
Albumin: 4.7 g/dL (ref 3.6–5.1)
BUN: 13 mg/dL (ref 7–25)
CO2: 26 mmol/L (ref 20–32)
CREATININE: 1.23 mg/dL (ref 0.60–1.35)
Calcium: 9.7 mg/dL (ref 8.6–10.3)
Chloride: 104 mmol/L (ref 98–110)
GFR, EST NON AFRICAN AMERICAN: 69 mL/min/{1.73_m2} (ref >=60)
GFR, Est African American: 81 mL/min/{1.73_m2} (ref >=60)
Globulin: 2.2 g/dL (calc) (ref 1.9–3.7)
Glucose, Bld: 93 mg/dL (ref 65–99)
Potassium: 4.1 mmol/L (ref 3.5–5.3)
Sodium: 140 mmol/L (ref 135–146)
Total Bilirubin: 0.5 mg/dL (ref 0.2–1.2)
Total Protein: 6.9 g/dL (ref 6.1–8.1)

## 2018-12-18 LAB — MICROALBUMIN / CREATININE URINE RATIO
Creatinine, Urine: 110 mg/dL (ref 20–320)
Microalb Creat Ratio: 3 mcg/mg creat (ref <30)
Microalb, Ur: 0.3 mg/dL

## 2018-12-18 LAB — IRON, TOTAL/TOTAL IRON BINDING CAP
%SAT: 35 % (calc) (ref 20–48)
IRON: 92 ug/dL (ref 50–180)
TIBC: 263 mcg/dL (calc) (ref 250–425)

## 2018-12-18 LAB — VITAMIN B12: Vitamin B-12: 532 pg/mL (ref 200–1100)

## 2018-12-18 LAB — URINALYSIS, ROUTINE W REFLEX MICROSCOPIC
BILIRUBIN URINE: NEGATIVE
Glucose, UA: NEGATIVE
Hgb urine dipstick: NEGATIVE
Ketones, ur: NEGATIVE
Leukocytes, UA: NEGATIVE
NITRITE: NEGATIVE
Protein, ur: NEGATIVE
Specific Gravity, Urine: 1.017 (ref 1.001–1.03)
pH: <=5 (ref 5.0–8.0)

## 2018-12-18 LAB — LIPID PANEL
CHOL/HDL RATIO: 6.1 (calc) — AB (ref <5.0)
Cholesterol: 177 mg/dL (ref <200)
HDL: 29 mg/dL — ABNORMAL LOW (ref >40)
LDL Cholesterol (Calc): 107 mg/dL (calc) — ABNORMAL HIGH
Non-HDL Cholesterol (Calc): 148 mg/dL (calc) — ABNORMAL HIGH (ref <130)
Triglycerides: 289 mg/dL — ABNORMAL HIGH (ref <150)

## 2018-12-18 LAB — HEMOGLOBIN A1C
Hgb A1c MFr Bld: 6.6 % of total Hgb — ABNORMAL HIGH (ref <5.7)
Mean Plasma Glucose: 143 (calc)
eAG (mmol/L): 7.9 (calc)

## 2018-12-18 LAB — INSULIN, RANDOM: Insulin: 18.6 u[IU]/mL (ref 2.0–19.6)

## 2018-12-18 LAB — MAGNESIUM: Magnesium: 1.8 mg/dL (ref 1.5–2.5)

## 2018-12-18 LAB — VITAMIN D 25 HYDROXY (VIT D DEFICIENCY, FRACTURES): Vit D, 25-Hydroxy: 109 ng/mL — ABNORMAL HIGH (ref 30–100)

## 2018-12-18 LAB — TESTOSTERONE: Testosterone: 337 ng/dL (ref 250–827)

## 2018-12-18 LAB — TSH: TSH: 0.76 mIU/L (ref 0.40–4.50)

## 2018-12-18 LAB — PSA: PSA: 0.6 ng/mL (ref <=4.0)

## 2019-01-19 ENCOUNTER — Ambulatory Visit: Payer: 59 | Admitting: Internal Medicine

## 2019-01-19 ENCOUNTER — Encounter: Payer: Self-pay | Admitting: Internal Medicine

## 2019-01-19 VITALS — BP 140/100 | HR 92 | Temp 97.5°F | Ht 71.0 in | Wt 269.0 lb

## 2019-01-19 DIAGNOSIS — S39012A Strain of muscle, fascia and tendon of lower back, initial encounter: Secondary | ICD-10-CM

## 2019-01-19 MED ORDER — PREDNISONE 20 MG PO TABS: ORAL_TABLET | ORAL | 0 refills | Status: DC

## 2019-01-19 MED ORDER — CYCLOBENZAPRINE HCL 10 MG PO TABS: ORAL_TABLET | ORAL | 0 refills | Status: DC

## 2019-01-19 NOTE — Patient Instructions (Signed)
Acute Back Pain, Adult  Acute back pain is sudden and usually short-lived. It is often caused by an injury to the muscles and tissues in the back. The injury may result from:   A muscle or ligament getting overstretched or torn (strained). Ligaments are tissues that connect bones to each other. Lifting something improperly can cause a back strain.   Wear and tear (degeneration) of the spinal disks. Spinal disks are circular tissue that provides cushioning between the bones of the spine (vertebrae).   Twisting motions, such as while playing sports or doing yard work.   A hit to the back.   Arthritis.  You may have a physical exam, lab tests, and imaging tests to find the cause of your pain. Acute back pain usually goes away with rest and home care.  Follow these instructions at home:  Managing pain, stiffness, and swelling   Take over-the-counter and prescription medicines only as told by your health care provider.   Your health care provider may recommend applying ice during the first 24-48 hours after your pain starts. To do this:  ? Put ice in a plastic bag.  ? Place a towel between your skin and the bag.  ? Leave the ice on for 20 minutes, 2-3 times a day.   If directed, apply heat to the affected area as often as told by your health care provider. Use the heat source that your health care provider recommends, such as a moist heat pack or a heating pad.  ? Place a towel between your skin and the heat source.  ? Leave the heat on for 20-30 minutes.  ? Remove the heat if your skin turns bright red. This is especially important if you are unable to feel pain, heat, or cold. You have a greater risk of getting burned.  Activity     Do not stay in bed. Staying in bed for more than 1-2 days can delay your recovery.   Sit up and stand up straight. Avoid leaning forward when you sit, or hunching over when you stand.  ? If you work at a desk, sit close to it so you do not need to lean over. Keep your chin tucked  in. Keep your neck drawn back, and keep your elbows bent at a right angle. Your arms should look like the letter "L."  ? Sit high and close to the steering wheel when you drive. Add lower back (lumbar) support to your car seat, if needed.   Take short walks on even surfaces as soon as you are able. Try to increase the length of time you walk each day.   Do not sit, drive, or stand in one place for more than 30 minutes at a time. Sitting or standing for long periods of time can put stress on your back.   Do not drive or use heavy machinery while taking prescription pain medicine.   Use proper lifting techniques. When you bend and lift, use positions that put less stress on your back:  ? Bend your knees.  ? Keep the load close to your body.  ? Avoid twisting.   Exercise regularly as told by your health care provider. Exercising helps your back heal faster and helps prevent back injuries by keeping muscles strong and flexible.   Work with a physical therapist to make a safe exercise program, as recommended by your health care provider. Do any exercises as told by your physical therapist.  Lifestyle   Maintain   a healthy weight. Extra weight puts stress on your back and makes it difficult to have good posture.   Avoid activities or situations that make you feel anxious or stressed. Stress and anxiety increase muscle tension and can make back pain worse. Learn ways to manage anxiety and stress, such as through exercise.  General instructions   Sleep on a firm mattress in a comfortable position. Try lying on your side with your knees slightly bent. If you lie on your back, put a pillow under your knees.   Follow your treatment plan as told by your health care provider. This may include:  ? Cognitive or behavioral therapy.  ? Acupuncture or massage therapy.  ? Meditation or yoga.  Contact a health care provider if:   You have pain that is not relieved with rest or medicine.   You have increasing pain going down  into your legs or buttocks.   Your pain does not improve after 2 weeks.   You have pain at night.   You lose weight without trying.   You have a fever or chills.  Get help right away if:   You develop new bowel or bladder control problems.   You have unusual weakness or numbness in your arms or legs.   You develop nausea or vomiting.   You develop abdominal pain.   You feel faint.  Summary   Acute back pain is sudden and usually short-lived.   Use proper lifting techniques. When you bend and lift, use positions that put less stress on your back.   Take over-the-counter and prescription medicines and apply heat or ice as directed by your health care provider.  This information is not intended to replace advice given to you by your health care provider. Make sure you discuss any questions you have with your health care provider.  Document Released: 11/26/2005 Document Revised: 07/03/2018 Document Reviewed: 07/10/2017  Elsevier Interactive Patient Education  2019 [employer] Inc[employer]

## 2019-01-24 ENCOUNTER — Encounter: Payer: Self-pay | Admitting: Internal Medicine

## 2019-01-24 NOTE — Progress Notes (Signed)
   Subjective:    Patient ID: Mike Cohen, male    DOB: 12-17-1970, 48 y.o.   MRN: [id number]  HPI   This very nice 48 y.o. MBM  Presents with 3-4 day hx/o LBP w/o discrete injury or inciting event. No sciatica.   Outpatient Medications Prior to Visit  Medication Sig Dispense Refill  . aspirin EC 81 MG tablet Take 81 mg by mouth daily.    . cephALEXin (KEFLEX) 500 MG capsule Take 1 capsule 4 x /day with meals & Bedtime w/food for Skin Infection 120 capsule 5  . Cholecalciferol (VITAMIN D PO) Take 2,000 Int'l Units by mouth daily.    . Multiple Vitamin (MULTIVITAMIN) tablet Take 1 tablet by mouth daily.    . phentermine (ADIPEX-P) 37.5 MG tablet Take 1/2 to 1 tablet every morning for Dieting & Weight  Loss 30 tablet 5  . SYRINGE-NEEDLE, DISP, 3 ML (B-D 3CC LUER-LOK SYR 21GX1-1/2) 21G X 1-1/2" 3 ML MISC Test sugar once daily 50 each 0  . testosterone cypionate (DEPOTESTOSTERONE CYPIONATE) 200 MG/ML injection INJECT 2 MILLILITERS INTRAMUSCULARLY EVERY 2 WEEKS 10 mL 2  . topiramate (TOPAMAX) 25 MG tablet Take 1 tablet 2 x / day at Suppertime & Bedtime for Dieting & Weight Loss 180 tablet 1  . sildenafil (VIAGRA) 100 MG tablet Take 1/2 to 1 tablet daily as needed for XXXX 90 tablet 11   No facility-administered medications prior to visit.    No Known Allergies Past Medical History:  Diagnosis Date  . Hyperlipidemia   . Hypertension   . Other testicular hypofunction   . Prediabetes    Past Surgical History:  Procedure Laterality Date  . ROTATOR CUFF REPAIR Right 2007  . SHOULDER ARTHROSCOPY Right 2000   Review of Systems    10 point systems review negative except as above.    Objective:   Physical Exam  BP (!) 140/100   Pulse 92   Temp (!) 97.5 F (36.4 C)   Ht 5\' 11"  (1.803 m)   Wt 269 lb (122 kg)   SpO2 97%   BMI 37.52 kg/m   HEENT - WNL. Neck - supple.  Chest - Clear equal BS. Cor - Nl HS. RRR w/o sig MGR. PP 1(+). No edema. Abd - Negative MS- FROM w/o  deformities. Tender para-lumbar spasm Gait Nl. Neuro -  Nl w/o focal abnormalities. (-) SLR.     Assessment & Plan:   1. Strain of lumbar region, initial encounter  - predniSONE (DELTASONE) 20 MG tablet; 1 tab 3 x day for 3 days, then 1 tab 2 x day for 3 days, then 1 tab 1 x day for 5 days  Dispense: 20 tablet; Refill: 0  - cyclobenzaprine (FLEXERIL) 10 MG tablet; Take 1/2 to 1 tablet 3 x /day as needed for Muscle Spasm  Dispense: 90 tablet; Refill: 0  - Discussed proper back exercises, heating pad, posture and stretching exercises

## 2019-03-17 ENCOUNTER — Other Ambulatory Visit: Payer: Self-pay | Admitting: Internal Medicine

## 2019-03-20 ENCOUNTER — Ambulatory Visit: Payer: Self-pay | Admitting: Adult Health

## 2019-03-24 ENCOUNTER — Other Ambulatory Visit: Payer: Self-pay | Admitting: Internal Medicine

## 2019-03-25 NOTE — Progress Notes (Signed)
FOLLOW UP  Assessment and Plan:   Hypertension Persistently elevated over last 2 visits and rechecks; initiating losartan/hctz 50/12.5 mg daily today; he can follow up in 1 month for BP recheck NV Check labs next visit CMP/GFR Monitor blood pressure at home; patient to call if consistently greater than 130/80 Continue DASH diet.   Reminder to go to the ER if any CP, SOB, nausea, dizziness, severe HA, changes vision/speech, left arm numbness and tingling and jaw pain.  Cholesterol Currently borderline LDL goal not on statin therapy Continue low cholesterol diet and exercise.  Check lipid panel.   Prediabetes Working on lifestyle modification; borderline diabetic Continue diet and exercise.  Perform daily foot/skin check, notify office of any concerning changes.  Check A1C  Obesity with co morbidities Long discussion about weight loss, diet, and exercise He reports significant weight progress per home scale; attributes today weight to uniform and vest Recommended diet heavy in fruits and veggies and low in animal meats, cheeses, and dairy products, appropriate calorie intake Discussed ideal weight for height Focus on stress reduction as this is likely significantly limiting weight loss -  Patient on phentermine with benefit and no SE, taking drug breaks; continue close follow up. Will follow up in 3 months  Vitamin D Def At goal at last visit; continue supplementation to maintain goal of 60-100 Defer Vit D level  Hypogonadism - continue to monitor, states medication is helping with symptoms of low T.  Sildenafil PRN   Continue diet and meds as discussed. Further disposition pending results of labs. Discussed med's effects and SE's.   Over 30 minutes of exam, counseling, chart review, and critical decision making was performed.   Future Appointments  Date Time Provider [place]  06/26/2019 10:30 AM Unk Pinto, MD [facility] None  01/05/2020  2:00 PM Unk Pinto, MD [facility] None    ----------------------------------------------------------------------------------------------------------------------  HPI 48 y.o. male  presents for 3 month follow up on hypertension, cholesterol, prediabetes, obesity and vitamin D deficiency.   he is prescribed phentermine/topamax for weight loss; he started but then stopped   While on the medication they have lost 0 lbs since last visit. They deny palpitations, anxiety, trouble sleeping, elevated BP.   BMI is Body mass index is 38.08 kg/m., he has been working on diet and exercise, but admits he could be doing better. He reports he is changing positions and is under a lot of stress, also trying to hire a Materials engineer. He is  eat out less, bringing a packed lunch and fruit with him to work. Drinking more water. Not keeping soda in the house. He was exercising, going to gym but hasn't been able to due to covid 19. On home scales he is down from 260 lb to around 245 lb as of a few weeks ago.  Wt Readings from Last 3 Encounters:  03/26/19 273 lb (123.8 kg)  01/19/19 269 lb (122 kg)  12/17/18 262 lb (118.8 kg)   Typical breakfast: Oatmeal or applesauce or piece of fruit with black coffee AM snack: prepacked cheese and crackers Typical lunch: salad from home or sandwich, or will do chipotle and does the bowl and skips the white rice Typical dinner: chicken, pork, [place] baked/grilled, with vegetables, occasionally pasta, no bread  Watching portions  Exercise: - none since covid 19  Water intake: drinks occasional sweet tea, mostly water, 2-3 bottles at work, then 2-3 cups at home (20 oz each)  He does not have BP cuff, today their  BP is BP: (!) 140/100. Similar by manual recheck by provider.   He does not currently workout. He denies chest pain, shortness of breath, dizziness.   He is not on cholesterol medication and denies myalgias. His LDL cholesterol is at goal, though trigs remain elevated. The  cholesterol last visit was:   Lab Results  Component Value Date   CHOL 177 12/17/2018   HDL 29 (L) 12/17/2018   LDLCALC 107 (H) 12/17/2018   TRIG 289 (H) 12/17/2018   CHOLHDL 6.1 (H) 12/17/2018    He has been working on diet and exercise for prediabetes/newly progressed to diabetic range, working aggressively on lifestyle, and denies foot ulcerations, increased appetite, nausea, paresthesia of the feet, polydipsia, polyuria, visual disturbances, vomiting and weight loss. Last A1C in the office was:  Lab Results  Component Value Date   HGBA1C 6.6 (H) 12/17/2018   Patient is on Vitamin D supplement.   Lab Results  Component Value Date   VD25OH 109 (H) 12/17/2018     He has a history of testosterone deficiency and is on testosterone replacement, last shot 5 days ago. He states that the testosterone helps with his energy, libido, muscle mass. Lab Results  Component Value Date   TESTOSTERONE 337 12/17/2018      Current Medications:  Current Outpatient Medications on File Prior to Visit  Medication Sig  . aspirin EC 81 MG tablet Take 81 mg by mouth daily.  . Cholecalciferol (VITAMIN D PO) Take 2,000 Int'l Units by mouth daily.  . cyclobenzaprine (FLEXERIL) 10 MG tablet Take 1/2 to 1 tablet 3 x /day as needed for Muscle Spasm  . Multiple Vitamin (MULTIVITAMIN) tablet Take 1 tablet by mouth daily.  . phentermine (ADIPEX-P) 37.5 MG tablet Take 1/2 to 1 tablet every morning for Dieting & Weight  Loss  . sildenafil (VIAGRA) 100 MG tablet Take 1/2 to 1 tablet Daily if needed for XXXX  . SYRINGE-NEEDLE, DISP, 3 ML (B-D 3CC LUER-LOK SYR 21GX1-1/2) 21G X 1-1/2" 3 ML MISC Test sugar once daily  . testosterone cypionate (DEPOTESTOSTERONE CYPIONATE) 200 MG/ML injection INJECT 2 MILLILITERS INTRAMUSCULARLY EVERY 2 WEEKS  . topiramate (TOPAMAX) 25 MG tablet Take 1 tablet 2 x / day at Suppertime & Bedtime for Dieting & Weight Loss  . cephALEXin (KEFLEX) 500 MG capsule Take 1 capsule 4 x /day with  meals & Bedtime w/food for Skin Infection  . predniSONE (DELTASONE) 20 MG tablet 1 tab 3 x day for 3 days, then 1 tab 2 x day for 3 days, then 1 tab 1 x day for 5 days   No current facility-administered medications on file prior to visit.      Allergies: No Known Allergies   Medical History:  Past Medical History:  Diagnosis Date  . Hyperlipidemia   . Hypertension   . Other testicular hypofunction   . Prediabetes    Family history- Reviewed and unchanged Social history- Reviewed and unchanged   Review of Systems:  Review of Systems  Constitutional: Negative for malaise/fatigue and weight loss.  HENT: Negative for hearing loss and tinnitus.   Eyes: Negative for blurred vision and double vision.  Respiratory: Negative for cough, shortness of breath and wheezing.   Cardiovascular: Negative for chest pain, palpitations, orthopnea, claudication and leg swelling.  Gastrointestinal: Negative for abdominal pain, blood in stool, constipation, diarrhea, heartburn, melena, nausea and vomiting.  Genitourinary: Negative.   Musculoskeletal: Negative for joint pain and myalgias.  Skin: Negative for rash.  Neurological: Negative for  dizziness, tingling, sensory change, weakness and headaches.  Endo/Heme/Allergies: Negative for polydipsia.  Psychiatric/Behavioral: Negative.   All other systems reviewed and are negative.   Physical Exam: BP (!) 140/100   Pulse (!) 105   Temp (!) 97 F (36.1 C)   Ht 5\' 11"  (1.803 m)   Wt 273 lb (123.8 kg)   SpO2 96%   BMI 38.08 kg/m  Wt Readings from Last 3 Encounters:  03/26/19 273 lb (123.8 kg)  01/19/19 269 lb (122 kg)  12/17/18 262 lb (118.8 kg)   General Appearance: Well nourished, in no apparent distress. Eyes: PERRLA, EOMs, conjunctiva no swelling or erythema Sinuses: No Frontal/maxillary tenderness ENT/Mouth: Ext aud canals clear, TMs without erythema, bulging. No erythema, swelling, or exudate on post pharynx.  Tonsils not swollen or  erythematous. Hearing normal.  Neck: Supple, thyroid normal.  Respiratory: Respiratory effort normal, BS equal bilaterally without rales, rhonchi, wheezing or stridor.  Cardio: RRR with no MRGs. Brisk peripheral pulses without edema.  Abdomen: Soft, + BS.  Non tender, no guarding, rebound, hernias, masses. Lymphatics: Non tender without lymphadenopathy.  Musculoskeletal: Full ROM, 5/5 strength, Normal gait Skin: Warm, dry without rashes, lesions, ecchymosis.  Neuro: Cranial nerves intact. No cerebellar symptoms.  Psych: Awake and oriented X 3, normal affect, Insight and Judgment appropriate.    Izora Ribas, NP 2:12 PM [facility]

## 2019-03-26 ENCOUNTER — Encounter: Payer: Self-pay | Admitting: Adult Health

## 2019-03-26 ENCOUNTER — Ambulatory Visit: Payer: 59 | Admitting: Adult Health

## 2019-03-26 ENCOUNTER — Other Ambulatory Visit: Payer: Self-pay

## 2019-03-26 VITALS — BP 140/100 | HR 105 | Temp 97.0°F | Ht 71.0 in | Wt 273.0 lb

## 2019-03-26 DIAGNOSIS — I10 Essential (primary) hypertension: Secondary | ICD-10-CM | POA: Diagnosis not present

## 2019-03-26 DIAGNOSIS — E349 Endocrine disorder, unspecified: Secondary | ICD-10-CM

## 2019-03-26 DIAGNOSIS — R7303 Prediabetes: Secondary | ICD-10-CM

## 2019-03-26 DIAGNOSIS — E559 Vitamin D deficiency, unspecified: Secondary | ICD-10-CM

## 2019-03-26 DIAGNOSIS — E782 Mixed hyperlipidemia: Secondary | ICD-10-CM

## 2019-03-26 DIAGNOSIS — Z79899 Other long term (current) drug therapy: Secondary | ICD-10-CM

## 2019-03-26 DIAGNOSIS — E669 Obesity, unspecified: Secondary | ICD-10-CM

## 2019-03-26 MED ORDER — LOSARTAN POTASSIUM-HCTZ 50-12.5 MG PO TABS: 1.0000 | ORAL_TABLET | Freq: Every day | ORAL | 1 refills | Status: DC

## 2019-03-26 NOTE — Patient Instructions (Addendum)
Goals    . Blood Pressure < 120/80    . Weight (lb) < 230 lb (104.3 kg)     Weigh weekly and keep a record       HYPERTENSION INFORMATION  Monitor your blood pressure at home, please keep a record and bring that in with you to your next office visit.   Go to the ER if any CP, SOB, nausea, dizziness, severe HA, changes vision/speech  Due to a recent study, SPRINT, we have changed our goal for the systolic or top blood pressure number. Ideally we want your top number at 120  In the [place], 5000 people were randomized to a goal BP of 120 and 5000 people were randomized to a goal BP of less than 140. The patients with the goal BP at 120 had LESS DEMENTIA, LESS HEART ATTACKS, AND LESS STROKES, AS WELL AS OVERALL DECREASED MORTALITY OR DEATH RATE.    Your most recent BP: BP: (!) 140/100   Take your medications faithfully as instructed. Maintain a healthy weight. Get at least 150 minutes of aerobic exercise per week. Minimize salt intake. Minimize alcohol intake  DASH Eating Plan DASH stands for "Dietary Approaches to Stop Hypertension." The DASH eating plan is a healthy eating plan that has been shown to reduce high blood pressure (hypertension). Additional health benefits may include reducing the risk of type 2 diabetes mellitus, heart disease, and stroke. The DASH eating plan may also help with weight loss. WHAT DO I NEED TO KNOW ABOUT THE DASH EATING PLAN? For the DASH eating plan, you will follow these general guidelines:  Choose foods with a percent daily value for sodium of less than 5% (as listed on the food label).  Use salt-free seasonings or herbs instead of table salt or sea salt.  Check with your health care provider or pharmacist before using salt substitutes.  Eat lower-sodium products, often labeled as "lower sodium" or "no salt added."  Eat fresh foods.  Eat more vegetables, fruits, and low-fat dairy products.  Choose whole grains. Look for the word "whole"  as the first word in the ingredient list.  Choose fish and skinless chicken or [place] more often than red meat. Limit fish, poultry, and meat to 6 oz (170 g) each day.  Limit sweets, desserts, sugars, and sugary drinks.  Choose heart-healthy fats.  Limit cheese to 1 oz (28 g) per day.  Eat more home-cooked food and less restaurant, buffet, and fast food.  Limit fried foods.  Cook foods using methods other than frying.  Limit canned vegetables. If you do use them, rinse them well to decrease the sodium.  When eating at a restaurant, ask that your food be prepared with less salt, or no salt if possible. WHAT FOODS CAN I EAT? Seek help from a dietitian for individual calorie needs. Grains Whole grain or whole wheat bread. Brown rice. Whole grain or whole wheat pasta. Quinoa, bulgur, and whole grain cereals. Low-sodium cereals. Corn or whole wheat flour tortillas. Whole grain cornbread. Whole grain crackers. Low-sodium crackers. Vegetables Fresh or frozen vegetables (raw, steamed, roasted, or grilled). Low-sodium or reduced-sodium tomato and vegetable juices. Low-sodium or reduced-sodium tomato sauce and paste. Low-sodium or reduced-sodium canned vegetables.  Fruits All fresh, canned (in natural juice), or frozen fruits. Meat and Other Protein Products Ground beef (85% or leaner), grass-fed beef, or beef trimmed of fat. Skinless chicken or [place]. Ground chicken or [place]. Pork trimmed of fat. All fish and seafood. Eggs. Dried beans,  peas, or lentils. Unsalted nuts and seeds. Unsalted canned beans. Dairy Low-fat dairy products, such as skim or 1% milk, 2% or reduced-fat cheeses, low-fat ricotta or cottage cheese, or plain low-fat yogurt. Low-sodium or reduced-sodium cheeses. Fats and Oils Tub margarines without trans fats. Light or reduced-fat mayonnaise and salad dressings (reduced sodium). Avocado. Safflower, olive, or canola oils. Natural peanut or almond butter. Other Unsalted  popcorn and pretzels. The items listed above may not be a complete list of recommended foods or beverages. Contact your dietitian for more options. WHAT FOODS ARE NOT RECOMMENDED? Grains White bread. White pasta. White rice. Refined cornbread. Bagels and croissants. Crackers that contain trans fat. Vegetables Creamed or fried vegetables. Vegetables in a cheese sauce. Regular canned vegetables. Regular canned tomato sauce and paste. Regular tomato and vegetable juices. Fruits Dried fruits. Canned fruit in light or heavy syrup. Fruit juice. Meat and Other Protein Products Fatty cuts of meat. Ribs, chicken wings, bacon, sausage, bologna, salami, chitterlings, fatback, hot dogs, bratwurst, and packaged luncheon meats. Salted nuts and seeds. Canned beans with salt. Dairy Whole or 2% milk, cream, half-and-half, and cream cheese. Whole-fat or sweetened yogurt. Full-fat cheeses or blue cheese. Nondairy creamers and whipped toppings. Processed cheese, cheese spreads, or cheese curds. Condiments Onion and garlic salt, seasoned salt, table salt, and sea salt. Canned and packaged gravies. Worcestershire sauce. Tartar sauce. Barbecue sauce. Teriyaki sauce. Soy sauce, including reduced sodium. Steak sauce. Fish sauce. Oyster sauce. Cocktail sauce. Horseradish. Ketchup and mustard. Meat flavorings and tenderizers. Bouillon cubes. Hot sauce. Tabasco sauce. Marinades. Taco seasonings. Relishes. Fats and Oils Butter, stick margarine, lard, shortening, ghee, and bacon fat. Coconut, palm kernel, or palm oils. Regular salad dressings. Other Pickles and olives. Salted popcorn and pretzels. The items listed above may not be a complete list of foods and beverages to avoid. Contact your dietitian for more information. WHERE CAN I FIND MORE INFORMATION? National Heart, Lung, and Blood Institute: travelstabloid.com Document Released: 11/15/2011 Document Revised: 04/12/2014 Document  Reviewed: 09/30/2013 [facility] Patient Information 2015 [place], [place]. This information is not intended to replace advice given to you by your health care provider. Make sure you discuss any questions you have with your health care provider.      Hydrochlorothiazide, HCTZ; Losartan tablets What is this medicine? LOSARTAN; HYDROCHLOROTHIAZIDE (loe SAR tan; hye droe klor oh THYE a zide) is a combination of a drug that relaxes blood vessels and a diuretic. It is used to treat high blood pressure. This medicine may also reduce the risk of stroke in certain patients. This medicine may be used for other purposes; ask your health care provider or pharmacist if you have questions. COMMON BRAND NAME(S): Hyzaar What should I tell my health care provider before I take this medicine? They need to know if you have any of these conditions: -decreased urine -kidney disease -liver disease -if you are on a special diet, like a low-salt diet -immune system problems, like lupus -an unusual or allergic reaction to losartan, hydrochlorothiazide, sulfa drugs, other medicines, foods, dyes, or preservatives -pregnant or trying to get pregnant -breast-feeding How should I use this medicine? Take this medicine by mouth with a glass of water. Follow the directions on the prescription label. You can take it with or without food. If it upsets your stomach, take it with food. Take your medicine at regular intervals. Do not take it more often than directed. Do not stop taking except on your doctor's advice. Talk to your pediatrician regarding the use of this  medicine in children. Special care may be needed. Overdosage: If you think you have taken too much of this medicine contact a poison control center or emergency room at once. NOTE: This medicine is only for you. Do not share this medicine with others. What if I miss a dose? If you miss a dose, take it as soon as you can. If it is almost time for your next dose,  take only that dose. Do not take double or extra doses. What may interact with this medicine? -barbiturates, like phenobarbital -blood pressure medicines -celecoxib -cimetidine -corticosteroids -diabetic medicines -diuretics, especially triamterene, spironolactone or amiloride -fluconazole -lithium -NSAIDs, medicines for pain and inflammation, like ibuprofen or naproxen -potassium salts or potassium supplements -prescription pain medicines -rifampin -skeletal muscle relaxants like tubocurarine -some cholesterol-lowering medicines like cholestyramine or colestipol This list may not describe all possible interactions. Give your health care provider a list of all the medicines, herbs, non-prescription drugs, or dietary supplements you use. Also tell them if you smoke, drink alcohol, or use illegal drugs. Some items may interact with your medicine. What should I watch for while using this medicine? Check your blood pressure regularly while you are taking this medicine. Ask your doctor or health care professional what your blood pressure should be and when you should contact him or her. When you check your blood pressure, write down the measurements to show your doctor or health care professional. If you are taking this medicine for a long time, you must visit your health care professional for regular checks on your progress. Make sure you schedule appointments on a regular basis. You must not get dehydrated. Ask your doctor or health care professional how much fluid you need to drink a day. Check with him or her if you get an attack of severe diarrhea, nausea and vomiting, or if you sweat a lot. The loss of too much body fluid can make it dangerous for you to take this medicine. Women should inform their doctor if they wish to become pregnant or think they might be pregnant. There is a potential for serious side effects to an unborn child, particularly in the second or third trimester. Talk to your  health care professional or pharmacist for more information. You may get drowsy or dizzy. Do not drive, use machinery, or do anything that needs mental alertness until you know how this drug affects you. Do not stand or sit up quickly, especially if you are an older patient. This reduces the risk of dizzy or fainting spells. Alcohol can make you more drowsy and dizzy. Avoid alcoholic drinks. This medicine may affect your blood sugar level. If you have diabetes, check with your doctor or health care professional before changing the dose of your diabetic medicine. Avoid salt substitutes unless you are told otherwise by your doctor or health care professional. Do not treat yourself for coughs, colds, or pain while you are taking this medicine without asking your doctor or health care professional for advice. Some ingredients may increase your blood pressure. What side effects may I notice from receiving this medicine? Side effects that you should report to your doctor or health care professional as soon as possible: -allergic reactions like skin rash, itching or hives, swelling of the face, lips, or tongue -breathing problems -changes in vision -dark urine -eye pain -fast or irregular heart beat, palpitations, or chest pain -feeling faint or lightheaded -muscle cramps -persistent dry cough -redness, blistering, peeling or loosening of the skin, including inside the mouth -  stomach pain -trouble passing urine or change in the amount of urine -unusual bleeding or bruising -worsened gout pain -yellowing of the eyes or skin Side effects that usually do not require medical attention (report to your doctor or health care professional if they continue or are bothersome): -change in sex drive or performance -headache This list may not describe all possible side effects. Call your doctor for medical advice about side effects. You may report side effects to FDA at 1-800-FDA-1088. Where should I keep my  medicine? Keep out of the reach of children. Store at room temperature between 15 and 30 degrees C (59 and 86 degrees F). Protect from light. Keep container tightly closed. Throw away any unused medicine after the expiration date. NOTE: This sheet is a summary. It may not cover all possible information. If you have questions about this medicine, talk to your doctor, pharmacist, or health care provider.  2019 Elsevier/Gold Standard (2010-08-16 13:57:32)

## 2019-03-27 ENCOUNTER — Encounter: Payer: Self-pay | Admitting: Adult Health

## 2019-03-27 DIAGNOSIS — R945 Abnormal results of liver function studies: Secondary | ICD-10-CM

## 2019-03-27 DIAGNOSIS — K760 Fatty (change of) liver, not elsewhere classified: Secondary | ICD-10-CM | POA: Insufficient documentation

## 2019-03-27 LAB — COMPLETE METABOLIC PANEL WITH GFR
AG Ratio: 2 (calc) (ref 1.0–2.5)
ALT: 40 U/L (ref 9–46)
AST: 51 U/L — ABNORMAL HIGH (ref 10–40)
Albumin: 4.7 g/dL (ref 3.6–5.1)
Alkaline phosphatase (APISO): 50 U/L (ref 36–130)
BUN: 12 mg/dL (ref 7–25)
CO2: 27 mmol/L (ref 20–32)
Calcium: 9.5 mg/dL (ref 8.6–10.3)
Chloride: 104 mmol/L (ref 98–110)
Creat: 1.24 mg/dL (ref 0.60–1.35)
GFR, Est African American: 80 mL/min/{1.73_m2} (ref >=60)
GFR, Est Non African American: 69 mL/min/{1.73_m2} (ref >=60)
Globulin: 2.3 g/dL (calc) (ref 1.9–3.7)
Glucose, Bld: 113 mg/dL — ABNORMAL HIGH (ref 65–99)
Potassium: 4.2 mmol/L (ref 3.5–5.3)
Sodium: 139 mmol/L (ref 135–146)
Total Bilirubin: 0.4 mg/dL (ref 0.2–1.2)
Total Protein: 7 g/dL (ref 6.1–8.1)

## 2019-03-27 LAB — LIPID PANEL
Cholesterol: 146 mg/dL (ref <200)
HDL: 24 mg/dL — ABNORMAL LOW (ref >=40)
Non-HDL Cholesterol (Calc): 122 mg/dL (calc) (ref <130)
Total CHOL/HDL Ratio: 6.1 (calc) — ABNORMAL HIGH (ref <5.0)
Triglycerides: 411 mg/dL — ABNORMAL HIGH (ref <150)

## 2019-03-27 LAB — CBC WITH DIFFERENTIAL/PLATELET
Absolute Monocytes: 418 cells/uL (ref 200–950)
Basophils Absolute: 50 cells/uL (ref 0–200)
Basophils Relative: 0.9 %
Eosinophils Absolute: 176 cells/uL (ref 15–500)
Eosinophils Relative: 3.2 %
HCT: 48.9 % (ref 38.5–50.0)
Hemoglobin: 17 g/dL (ref 13.2–17.1)
Lymphs Abs: 1766 cells/uL (ref 850–3900)
MCH: 27.6 pg (ref 27.0–33.0)
MCHC: 34.8 g/dL (ref 32.0–36.0)
MCV: 79.5 fL — ABNORMAL LOW (ref 80.0–100.0)
MPV: 10.3 fL (ref 7.5–12.5)
Monocytes Relative: 7.6 %
Neutro Abs: 3091 cells/uL (ref 1500–7800)
Neutrophils Relative %: 56.2 %
Platelets: 232 10*3/uL (ref 140–400)
RBC: 6.15 10*6/uL — ABNORMAL HIGH (ref 4.20–5.80)
RDW: 13.7 % (ref 11.0–15.0)
Total Lymphocyte: 32.1 %
WBC: 5.5 10*3/uL (ref 3.8–10.8)

## 2019-03-27 LAB — HEMOGLOBIN A1C
Hgb A1c MFr Bld: 5.7 % of total Hgb — ABNORMAL HIGH (ref <5.7)
Mean Plasma Glucose: 117 (calc)
eAG (mmol/L): 6.5 (calc)

## 2019-03-27 LAB — TSH: TSH: 0.8 mIU/L (ref 0.40–4.50)

## 2019-05-20 ENCOUNTER — Other Ambulatory Visit: Payer: Self-pay | Admitting: Internal Medicine

## 2019-06-16 ENCOUNTER — Other Ambulatory Visit: Payer: Self-pay | Admitting: Internal Medicine

## 2019-06-16 DIAGNOSIS — S39012A Strain of muscle, fascia and tendon of lower back, initial encounter: Secondary | ICD-10-CM

## 2019-06-16 MED ORDER — PREDNISONE 10 MG PO TABS: ORAL_TABLET | ORAL | 0 refills | Status: DC

## 2019-06-25 NOTE — Progress Notes (Signed)
THIS ENCOUNTER IS A VIRTUAL VISIT DUE TO COVID-19 - PATIENT WAS NOT SEEN IN THE OFFICE.  PATIENT HAS CONSENTED TO VIRTUAL VISIT / TELEMEDICINE VISIT  This provider placed a call to Mike Cohen using telephone, his appointment was changed to a virtual office visit to reduce the risk of exposure to the COVID-19 virus and to help Leigh Kaeding remain healthy and safe. The virtual visit will also provide continuity of care. He verbalizes understanding.   Virtual Visit via telephone Note  I connected with  Mike Cohen  on 06/25/19  by telephone.  I verified that I am speaking with the correct person using two identifiers.        I discussed the limitations of evaluation and management by telemedicine and the availability of  in person appointments. The patient expressed understanding and agreed to proceed.  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       Patient is followed for labile HTN (2007) & BP has been controlled at home. Patient has had no complaints of any cardiac type chest pain, palpitations, dyspnea / orthopnea / PND, dizziness, claudication, or dependent edema.      Hyperlipidemia is controlled with diet & meds albeit elevated Trig's: Patient denies myalgias or other med SE's. Last Lipids were not at goal: Lab Results  Component Value Date   CHOL 146 03/26/2019   HDL 24 (L) 03/26/2019   LDLCALC not calculated 03/26/2019   TRIG 411 (H) 03/26/2019   CHOLHDL 6.1 (H) 03/26/2019       Also, the patient has Morbid Obesity (BMI 38+)  and PreDM (A1c 6.5% /2012 then 6.1% /2012 & 2016)  and has had no symptoms of reactive hypoglycemia, diabetic polys, paresthesias or visual blurring.  He gas lost 25# in the last several months on Phentermine / Topiramate. Last A1c was not at goal: Lab Results  Component Value Date   HGBA1C 5.7 (H) 03/26/2019        Patient also has Testosterone Deficiency ("252"/2010 & "117"/2012) and is on Depo-Testosterone parenteral replacement injections with improved stamina & sense of well-being.       Further, the patient also has history of Vitamin D Deficiency ("9" / 2009) and supplements vitamin D without any suspected side-effects. Last vitamin D was borderline sl elevated: Lab Results  Component Value Date   VD25OH 109 (H) 12/17/2018   Current Outpatient Medications on File Prior to Visit  Medication Sig  . aspirin EC 81 MG tablet Take 81 mg by mouth daily.  . cephALEXin (KEFLEX) 500 MG capsule Take 1 capsule 4 x /day with meals & Bedtime w/food for Skin Infection  . Cholecalciferol (VITAMIN D PO) Take 2,000 Int'l Units by mouth daily.  . cyclobenzaprine (FLEXERIL) 10 MG tablet Take 1/2 to 1 tablet 3 x /day as needed for Muscle Spasm  . losartan-hydrochlorothiazide (HYZAAR) 50-12.5 MG tablet Take 1 tablet by mouth daily.  . Multiple Vitamin (MULTIVITAMIN) tablet Take 1 tablet by mouth daily.  . phentermine (ADIPEX-P) 37.5 MG tablet Take 1/2 to 1 tablet every morning for Dieting & Weight  Loss  . sildenafil (VIAGRA) 100 MG tablet Take 1/2 to 1 tablet Daily as needed for XXXX  . SYRINGE-NEEDLE, DISP, 3 ML (B-D 3CC LUER-LOK SYR 21GX1-1/2) 21G X 1-1/2" 3 ML MISC Test sugar once daily  . testosterone cypionate (DEPOTESTOSTERONE  CYPIONATE) 200 MG/ML injection INJECT 2 MILLILITERS INTRAMUSCULARLY EVERY 2 WEEKS  . topiramate (TOPAMAX) 25 MG tablet Take 1 tablet 2 x / day at Suppertime & Bedtime for Dieting & Weight Loss   No current facility-administered medications on file prior to visit.    No Known Allergies   PMHx:   Past Medical History:  Diagnosis Date  . Hyperlipidemia   . Hypertension   . Other testicular hypofunction   . Prediabetes    Immunization History  Administered Date(s) Administered  . Influenza Inj Mdck Quad With Preservative 11/04/2017  . Influenza Split 09/22/2014, 09/19/2015,  09/12/2016  . Influenza-Unspecified 09/09/2018  . PPD Test 09/19/2015, 10/17/2016, 11/13/2017, 12/17/2018  . Td 10/13/2002  . Tdap 09/19/2015   Past Surgical History:  Procedure Laterality Date  . ROTATOR CUFF REPAIR Right 2007  . SHOULDER ARTHROSCOPY Right 2000   FHx:    Reviewed / unchanged  SHx:    Reviewed / unchanged   Systems Review:  Constitutional: Denies fever, chills, wt changes, headaches, insomnia, fatigue, night sweats, change in appetite. Eyes: Denies redness, blurred vision, diplopia, discharge, itchy, watery eyes.  ENT: Denies discharge, congestion, post nasal drip, epistaxis, sore throat, earache, hearing loss, dental pain, tinnitus, vertigo, sinus pain, snoring.  CV: Denies chest pain, palpitations, irregular heartbeat, syncope, dyspnea, diaphoresis, orthopnea, PND, claudication or edema. Respiratory: denies cough, dyspnea, DOE, pleurisy, hoarseness, laryngitis, wheezing.  Gastrointestinal: Denies dysphagia, odynophagia, heartburn, reflux, water brash, abdominal pain or cramps, nausea, vomiting, bloating, diarrhea, constipation, hematemesis, melena, hematochezia  or hemorrhoids. Genitourinary: Denies dysuria, frequency, urgency, nocturia, hesitancy, discharge, hematuria or flank pain. Musculoskeletal: Denies arthralgias, myalgias, stiffness, jt. swelling, pain, limping or strain/sprain.  Skin: Denies pruritus, rash, hives, warts, acne, eczema or change in skin lesion(s). Neuro: No weakness, tremor, incoordination, spasms, paresthesia or pain. Psychiatric: Denies confusion, memory loss or sensory loss. Endo: Denies change in weight, skin or hair change.  Heme/Lymph: No excessive bleeding, bruising or enlarged lymph nodes.  Physical Exam  General : Well sounding patient in no apparent distress HEENT: no hoarseness, no cough for duration of visit Lungs: speaks in complete sentences, no audible wheezing, no apparent distress Neurological: alert, oriented x 3  Psychiatric: pleasant, judgement appropriate   Assessment and Plan:  1. Essential hypertension  - Continue medication, monitor blood pressure at home.  - Continue DASH diet.  Reminder to go to the ER if any CP,  SOB, nausea, dizziness, severe HA, changes vision/speech.  - CBC with Differential/Platelet; Future - COMPLETE METABOLIC PANEL WITH GFR; Future - Magnesium; Future - TSH; Future  2. Hyperlipidemia, mixed  - Continue diet/meds, exercise,& lifestyle modifications.  - Continue monitor periodic cholesterol/liver & renal functions   - Lipid panel; Future  3. Abnormal glucose  - Continue diet, exercise  - Lifestyle modifications.  - Monitor appropriate labs.  - Hemoglobin A1c; Future  4. Vitamin D deficiency  - Continue supplementation.  - VITAMIN D 25 Hydroxy (Vit-D Deficiency, Fractures); Future  5. Prediabetes  - Hemoglobin A1c; Future - Insulin, random; Future  6. Class 2 severe obesity due to excess calories with serious comorbidity and body mass index (BMI) of 36.0 to 36.9 in adult (HCC)  - topiramate (TOPAMAX) 50 MG tablet; Take 1 tablet 2 x / day at Suppertime & Bedtime for Dieting & Weight Loss  Dispense: 180 tablet; Refill: 1 - phentermine (ADIPEX-P) 37.5 MG tablet; Take 1/2 to 1 tablet every morning for Dieting & Weight  Loss  Dispense: 90 tablet; Refill: 1  7.  Testosterone deficiency  - Testosterone; Future  8. Medication management  - CBC with Differential/Platelet; Future - COMPLETE METABOLIC PANEL WITH GFR; Future - Magnesium; Future - Lipid panel; Future - TSH; Future - Hemoglobin A1c; Future - Insulin, random; Future - VITAMIN D 25 Hydroxy (Vit-D Deficiency, Fractures); Future - Testosterone; Future       Discussed  regular exercise, BP monitoring, weight control to achieve/maintain BMI less than 25 and discussed med and SE's. Recommended labs to assess and monitor clinical status with further disposition pending results of labs. I  discussed the assessment and treatment plan with the patient. The patient was provided an opportunity to ask questions and all were answered. The patient agreed with the plan and demonstrated an understanding of the instructions. I provided 21 minutes of non-face-to-face time during this encounter and over 26 minutes of exam, counseling, chart review and  complex critical decision making was performed   Kirtland Bouchard, MD

## 2019-06-26 ENCOUNTER — Ambulatory Visit: Payer: 59 | Admitting: Internal Medicine

## 2019-06-26 ENCOUNTER — Ambulatory Visit: Payer: Self-pay | Admitting: Internal Medicine

## 2019-06-26 ENCOUNTER — Encounter: Payer: Self-pay | Admitting: Internal Medicine

## 2019-06-26 DIAGNOSIS — E782 Mixed hyperlipidemia: Secondary | ICD-10-CM

## 2019-06-26 DIAGNOSIS — R7303 Prediabetes: Secondary | ICD-10-CM

## 2019-06-26 DIAGNOSIS — R7309 Other abnormal glucose: Secondary | ICD-10-CM

## 2019-06-26 DIAGNOSIS — Z79899 Other long term (current) drug therapy: Secondary | ICD-10-CM

## 2019-06-26 DIAGNOSIS — E66812 Obesity, class 2: Secondary | ICD-10-CM

## 2019-06-26 DIAGNOSIS — I10 Essential (primary) hypertension: Secondary | ICD-10-CM

## 2019-06-26 DIAGNOSIS — E559 Vitamin D deficiency, unspecified: Secondary | ICD-10-CM

## 2019-06-26 DIAGNOSIS — Z6836 Body mass index (BMI) 36.0-36.9, adult: Secondary | ICD-10-CM

## 2019-06-26 DIAGNOSIS — E349 Endocrine disorder, unspecified: Secondary | ICD-10-CM

## 2019-06-26 MED ORDER — PHENTERMINE HCL 37.5 MG PO TABS: ORAL_TABLET | ORAL | 1 refills | Status: DC

## 2019-06-26 MED ORDER — TOPIRAMATE 50 MG PO TABS: ORAL_TABLET | ORAL | 1 refills | Status: DC

## 2019-06-26 NOTE — Patient Instructions (Signed)

## 2019-06-29 ENCOUNTER — Other Ambulatory Visit: Payer: Self-pay

## 2019-07-06 ENCOUNTER — Other Ambulatory Visit: Payer: 59

## 2019-07-08 ENCOUNTER — Other Ambulatory Visit (INDEPENDENT_AMBULATORY_CARE_PROVIDER_SITE_OTHER): Payer: 59

## 2019-07-08 ENCOUNTER — Other Ambulatory Visit: Payer: Self-pay

## 2019-07-08 DIAGNOSIS — I10 Essential (primary) hypertension: Secondary | ICD-10-CM

## 2019-07-08 DIAGNOSIS — E349 Endocrine disorder, unspecified: Secondary | ICD-10-CM

## 2019-07-08 DIAGNOSIS — R7303 Prediabetes: Secondary | ICD-10-CM

## 2019-07-08 DIAGNOSIS — R7309 Other abnormal glucose: Secondary | ICD-10-CM

## 2019-07-08 DIAGNOSIS — E782 Mixed hyperlipidemia: Secondary | ICD-10-CM

## 2019-07-08 DIAGNOSIS — Z79899 Other long term (current) drug therapy: Secondary | ICD-10-CM

## 2019-07-08 DIAGNOSIS — E559 Vitamin D deficiency, unspecified: Secondary | ICD-10-CM

## 2019-07-09 ENCOUNTER — Other Ambulatory Visit: Payer: Self-pay | Admitting: Internal Medicine

## 2019-07-09 DIAGNOSIS — E782 Mixed hyperlipidemia: Secondary | ICD-10-CM

## 2019-07-09 LAB — COMPLETE METABOLIC PANEL WITH GFR
AG Ratio: 2 (calc) (ref 1.0–2.5)
ALT: 40 U/L (ref 9–46)
AST: 34 U/L (ref 10–40)
Albumin: 5 g/dL (ref 3.6–5.1)
Alkaline phosphatase (APISO): 42 U/L (ref 36–130)
BUN/Creatinine Ratio: 13 (calc) (ref 6–22)
BUN: 19 mg/dL (ref 7–25)
CO2: 28 mmol/L (ref 20–32)
Calcium: 10.3 mg/dL (ref 8.6–10.3)
Chloride: 100 mmol/L (ref 98–110)
Creat: 1.46 mg/dL — ABNORMAL HIGH (ref 0.60–1.35)
GFR, Est African American: 65 mL/min/{1.73_m2} (ref >=60)
GFR, Est Non African American: 56 mL/min/{1.73_m2} — ABNORMAL LOW (ref >=60)
Globulin: 2.5 g/dL (calc) (ref 1.9–3.7)
Glucose, Bld: 119 mg/dL — ABNORMAL HIGH (ref 65–99)
Potassium: 4 mmol/L (ref 3.5–5.3)
Sodium: 137 mmol/L (ref 135–146)
Total Bilirubin: 0.5 mg/dL (ref 0.2–1.2)
Total Protein: 7.5 g/dL (ref 6.1–8.1)

## 2019-07-09 LAB — CBC WITH DIFFERENTIAL/PLATELET
Absolute Monocytes: 521 cells/uL (ref 200–950)
Basophils Absolute: 53 cells/uL (ref 0–200)
Basophils Relative: 0.8 %
Eosinophils Absolute: 53 cells/uL (ref 15–500)
Eosinophils Relative: 0.8 %
HCT: 56.6 % — ABNORMAL HIGH (ref 38.5–50.0)
Hemoglobin: 19 g/dL — ABNORMAL HIGH (ref 13.2–17.1)
Lymphs Abs: 2191 cells/uL (ref 850–3900)
MCH: 27.1 pg (ref 27.0–33.0)
MCHC: 33.6 g/dL (ref 32.0–36.0)
MCV: 80.7 fL (ref 80.0–100.0)
MPV: 10.2 fL (ref 7.5–12.5)
Monocytes Relative: 7.9 %
Neutro Abs: 3782 cells/uL (ref 1500–7800)
Neutrophils Relative %: 57.3 %
Platelets: 251 10*3/uL (ref 140–400)
RBC: 7.01 10*6/uL — ABNORMAL HIGH (ref 4.20–5.80)
RDW: 17.2 % — ABNORMAL HIGH (ref 11.0–15.0)
Total Lymphocyte: 33.2 %
WBC: 6.6 10*3/uL (ref 3.8–10.8)

## 2019-07-09 LAB — HEMOGLOBIN A1C
Hgb A1c MFr Bld: 6.1 % of total Hgb — ABNORMAL HIGH (ref <5.7)
Mean Plasma Glucose: 128 (calc)
eAG (mmol/L): 7.1 (calc)

## 2019-07-09 LAB — MAGNESIUM: Magnesium: 1.9 mg/dL (ref 1.5–2.5)

## 2019-07-09 LAB — VITAMIN D 25 HYDROXY (VIT D DEFICIENCY, FRACTURES): Vit D, 25-Hydroxy: 103 ng/mL — ABNORMAL HIGH (ref 30–100)

## 2019-07-09 LAB — LIPID PANEL
Cholesterol: 171 mg/dL (ref <200)
HDL: 28 mg/dL — ABNORMAL LOW (ref >=40)
LDL Cholesterol (Calc): 110 mg/dL (calc) — ABNORMAL HIGH
Non-HDL Cholesterol (Calc): 143 mg/dL (calc) — ABNORMAL HIGH (ref <130)
Total CHOL/HDL Ratio: 6.1 (calc) — ABNORMAL HIGH (ref <5.0)
Triglycerides: 213 mg/dL — ABNORMAL HIGH (ref <150)

## 2019-07-09 LAB — TSH: TSH: 1.02 mIU/L (ref 0.40–4.50)

## 2019-07-09 LAB — TESTOSTERONE: Testosterone: 938 ng/dL — ABNORMAL HIGH (ref 250–827)

## 2019-07-09 LAB — INSULIN, RANDOM: Insulin: 76.6 u[IU]/mL — ABNORMAL HIGH

## 2019-07-09 MED ORDER — ROSUVASTATIN CALCIUM 40 MG PO TABS: ORAL_TABLET | ORAL | 1 refills | Status: DC

## 2019-08-22 ENCOUNTER — Other Ambulatory Visit: Payer: Self-pay | Admitting: Internal Medicine

## 2019-08-25 ENCOUNTER — Other Ambulatory Visit: Payer: Self-pay

## 2019-09-28 ENCOUNTER — Other Ambulatory Visit: Payer: Self-pay | Admitting: Internal Medicine

## 2019-11-03 ENCOUNTER — Other Ambulatory Visit: Payer: Self-pay | Admitting: Adult Health

## 2019-11-03 ENCOUNTER — Other Ambulatory Visit: Payer: Self-pay | Admitting: Internal Medicine

## 2020-01-05 ENCOUNTER — Ambulatory Visit: Payer: 59 | Admitting: Internal Medicine

## 2020-01-05 ENCOUNTER — Other Ambulatory Visit: Payer: Self-pay

## 2020-01-05 VITALS — BP 124/86 | HR 96 | Temp 97.0°F | Resp 16 | Ht 70.0 in | Wt 251.0 lb

## 2020-01-05 DIAGNOSIS — Z79899 Other long term (current) drug therapy: Secondary | ICD-10-CM

## 2020-01-05 DIAGNOSIS — R5383 Other fatigue: Secondary | ICD-10-CM

## 2020-01-05 DIAGNOSIS — Z8249 Family history of ischemic heart disease and other diseases of the circulatory system: Secondary | ICD-10-CM

## 2020-01-05 DIAGNOSIS — J300 Vasomotor rhinitis: Secondary | ICD-10-CM

## 2020-01-05 DIAGNOSIS — Z111 Encounter for screening for respiratory tuberculosis: Secondary | ICD-10-CM

## 2020-01-05 DIAGNOSIS — Z1211 Encounter for screening for malignant neoplasm of colon: Secondary | ICD-10-CM

## 2020-01-05 DIAGNOSIS — I10 Essential (primary) hypertension: Secondary | ICD-10-CM | POA: Diagnosis not present

## 2020-01-05 DIAGNOSIS — Z136 Encounter for screening for cardiovascular disorders: Secondary | ICD-10-CM | POA: Diagnosis not present

## 2020-01-05 DIAGNOSIS — Z125 Encounter for screening for malignant neoplasm of prostate: Secondary | ICD-10-CM

## 2020-01-05 DIAGNOSIS — Z0000 Encounter for general adult medical examination without abnormal findings: Secondary | ICD-10-CM | POA: Diagnosis not present

## 2020-01-05 DIAGNOSIS — R7309 Other abnormal glucose: Secondary | ICD-10-CM

## 2020-01-05 DIAGNOSIS — E782 Mixed hyperlipidemia: Secondary | ICD-10-CM

## 2020-01-05 DIAGNOSIS — E349 Endocrine disorder, unspecified: Secondary | ICD-10-CM

## 2020-01-05 DIAGNOSIS — R7303 Prediabetes: Secondary | ICD-10-CM

## 2020-01-05 DIAGNOSIS — Z6836 Body mass index (BMI) 36.0-36.9, adult: Secondary | ICD-10-CM

## 2020-01-05 DIAGNOSIS — E559 Vitamin D deficiency, unspecified: Secondary | ICD-10-CM

## 2020-01-05 DIAGNOSIS — Z0001 Encounter for general adult medical examination with abnormal findings: Secondary | ICD-10-CM

## 2020-01-05 DIAGNOSIS — G4733 Obstructive sleep apnea (adult) (pediatric): Secondary | ICD-10-CM

## 2020-01-05 DIAGNOSIS — Z9989 Dependence on other enabling machines and devices: Secondary | ICD-10-CM

## 2020-01-05 MED ORDER — IPRATROPIUM BROMIDE 0.06 % NA SOLN: NASAL | 3 refills | Status: DC

## 2020-01-05 NOTE — Progress Notes (Signed)
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  Patient has been followed for HTN, HLD, T2_NIDDM  Prediabetes and Vitamin D Deficiency. Patient is c/o watery rhinitis wearing face masks for work.      Labile  HTN predates since 2007. Patient's BP has been controlled at home.  Today's BP is at goal - 124/86. Patient denies any cardiac symptoms as chest pain, palpitations, shortness of breath, dizziness or ankle swelling.     Patient's hyperlipidemia is controlled with diet and medications. Patient denies myalgias or other medication SE's. Last lipids were not at goal:  Lab Results  Component Value Date   CHOL 171 07/08/2019   HDL 28 (L) 07/08/2019   LDLCALC 110 (H) 07/08/2019   TRIG 213 (H) 07/08/2019   CHOLHDL 6.1 (H) 07/08/2019      Patient has moderate obesity (BMI 36+) and hx/o  prediabetes (A1c 6.5% / 2012 then 6.1% / 2012 & 2016)  and patient denies reactive hypoglycemic symptoms, visual blurring, diabetic polys or paresthesias.  Note patient has lost 25# over the last year since taking Phentermine / Topamax ! Last A1c was not at goal:  Lab Results  Component Value Date   HGBA1C 6.1 (H) 07/08/2019                                           Patient is on Depo-Testosterone parenteral replacement injection for hx/o low Testosterone ("252" / 2010 & "117" / 2012) with improved stamina.      Finally, patient has history of Vitamin D Deficiency of    and last vitamin D was at goal:  Lab Results  Component Value Date   VD25OH 103 (H) 07/08/2019   Current Outpatient Medications on File Prior to Visit  Medication Sig  . aspirin EC 81 MG tablet Take 81 mg by mouth daily.  . Cholecalciferol (VITAMIN D PO) Take 2,000 Int'l Units by mouth daily.  . cyclobenzaprine (FLEXERIL) 10 MG tablet Take 1/2 to 1  tablet 3 x /day as needed for Muscle Spasm  . losartan-hydrochlorothiazide (HYZAAR) 50-12.5 MG tablet Take 1 tablet Daily for BP  . minocycline (MINOCIN) 50 MG capsule Take 50 mg by mouth 2 (two) times daily.  . Multiple Vitamin (MULTIVITAMIN) tablet Take 1 tablet by mouth daily.  . phentermine (ADIPEX-P) 37.5 MG tablet Take 1/2 to 1 tablet every morning for Dieting & Weight  Loss  . rosuvastatin (CRESTOR) 40 MG tablet Take 1/2 to 1 tablet daily or as directed for Cholesterol  . sildenafil (VIAGRA) 100 MG tablet Take 1/2 to 1 tablet Daily as needed for XXXX  . SYRINGE-NEEDLE, DISP, 3 ML (B-D 3CC LUER-LOK SYR 21GX1-1/2) 21G X 1-1/2" 3 ML MISC Test sugar once daily  . testosterone cypionate (DEPOTESTOSTERONE CYPIONATE) 200 MG/ML injection INJECT 2 ML'S INTO THE MUSCLE EVERY 2 WEEKS.  . topiramate (TOPAMAX) 50 MG tablet Take 1 tablet 2 x / day at Suppertime & Bedtime for Dieting & Weight Loss   No current facility-administered medications on file prior to visit.    Past Medical History:  Diagnosis Date  . Hyperlipidemia   . Hypertension   . Other testicular hypofunction   . Prediabetes    Health Maintenance  Topic Date  Due  . HIV Screening  09/26/1986  . TETANUS/TDAP  09/18/2025  . INFLUENZA VACCINE  Completed   Immunization History  Administered Date(s) Administered  . Influenza Inj Mdck Quad With Preservative 11/04/2017  . Influenza Split 09/22/2014, 09/19/2015, 09/12/2016  . Influenza-Unspecified 09/09/2018, 10/21/2019  . PPD Test 09/19/2015, 10/17/2016, 11/13/2017, 12/17/2018  . Td 10/13/2002  . Tdap 09/19/2015    Past Surgical History:  Procedure Laterality Date  . ROTATOR CUFF REPAIR Right 2007  . SHOULDER ARTHROSCOPY Right 2000   Family History  Problem Relation Age of Onset  . Heart disease Father   . Hyperlipidemia Father   . Hypertension Father    Social History   Socioeconomic History  . Marital status: Married    Spouse name: Sherrie  . Number of  children: 1 son   Occupational History  . [employer]  Tobacco Use  . Smoking status: Never Smoker  . Smokeless tobacco: Never Used  . Tobacco comment: occasional cigar  Substance and Sexual Activity  . Alcohol use: Yes    Alcohol/week: 0.0 standard drinks    Comment: occasional  . Drug use: No  . Sexual activity: Not on file     ROS Constitutional: Denies fever, chills, weight loss/gain, headaches, insomnia,  night sweats or change in appetite. Does c/o fatigue. Eyes: Denies redness, blurred vision, diplopia, discharge, itchy or watery eyes.  ENT: Denies discharge, congestion, post nasal drip, epistaxis, sore throat, earache, hearing loss, dental pain, Tinnitus, Vertigo, Sinus pain or snoring.  Cardio: Denies chest pain, palpitations, irregular heartbeat, syncope, dyspnea, diaphoresis, orthopnea, PND, claudication or edema Respiratory: denies cough, dyspnea, DOE, pleurisy, hoarseness, laryngitis or wheezing.  Gastrointestinal: Denies dysphagia, heartburn, reflux, water brash, pain, cramps, nausea, vomiting, bloating, diarrhea, constipation, hematemesis, melena, hematochezia, jaundice or hemorrhoids Genitourinary: Denies dysuria, frequency, urgency, nocturia, hesitancy, discharge, hematuria or flank pain Musculoskeletal: Denies arthralgia, myalgia, stiffness, Jt. Swelling, pain, limp or strain/sprain. Denies Falls. Skin: Denies puritis, rash, hives, warts, acne, eczema or change in skin lesion Neuro: No weakness, tremor, incoordination, spasms, paresthesia or pain Psychiatric: Denies confusion, memory loss or sensory loss. Denies Depression. Endocrine: Denies change in weight, skin, hair change, nocturia, and paresthesia, diabetic polys, visual blurring or hyper / hypo glycemic episodes.  Heme/Lymph: No excessive bleeding, bruising or enlarged lymph nodes.  Physical Exam  BP 124/86   Pulse 96   Temp (!) 97 F (36.1 C)   Resp 16   Ht 5\' 10"  (1.778 m)   Wt 251 lb (113.9  kg)   BMI 36.01 kg/m   General Appearance: Well nourished and well groomed and in no apparent distress.  Eyes: PERRLA, EOMs, conjunctiva no swelling or erythema, normal fundi and vessels. Sinuses: No frontal/maxillary tenderness ENT/Mouth: EACs patent / TMs  nl. Nares clear without erythema, swelling, mucoid exudates. Oral hygiene is good. No erythema, swelling, or exudate. Tongue normal, non-obstructing. Tonsils not swollen or erythematous. Hearing normal.  Neck: Supple, thyroid not palpable. No bruits, nodes or JVD. Respiratory: Respiratory effort normal.  BS equal and clear bilateral without rales, rhonci, wheezing or stridor. Cardio: Heart sounds are normal with regular rate and rhythm and no murmurs, rubs or gallops. Peripheral pulses are normal and equal bilaterally without edema. No aortic or femoral bruits. Chest: symmetric with normal excursions and percussion.  Abdomen: Soft, obese with Nl bowel sounds. Nontender, no guarding, rebound, hernias, masses, or organomegaly.  Lymphatics: Non tender without lymphadenopathy.  Musculoskeletal: Full ROM all peripheral extremities, joint stability, 5/5 strength, and normal gait. Skin:  Warm and dry without rashes, lesions, cyanosis, clubbing or  ecchymosis.  Neuro: Cranial nerves intact, reflexes equal bilaterally. Normal muscle tone, no cerebellar symptoms. Sensation intact.  Pysch: Alert and oriented X 3 with normal affect, insight and judgment appropriate.   Assessment and Plan  1. Annual Preventative/Screening Exam   2. Essential hypertension  - EKG 12-Lead - Urinalysis, Routine w reflex microscopic - Microalbumin / creatinine urine ratio - CBC with Differential/Platelet - COMPLETE METABOLIC PANEL WITH GFR - Magnesium - TSH  3. Hyperlipidemia, mixed  - EKG 12-Lead - Lipid panel - TSH  4. Abnormal glucose  - EKG 12-Lead - Hemoglobin A1c - Insulin, random  5. Vitamin D deficiency  - VITAMIN D 25 Hydroxy  6.  Prediabetes  - EKG 12-Lead - Hemoglobin A1c - Insulin, random  7. Class 2 severe obesity due to excess calories with serious comorbidity  and body mass index (BMI) of 36.0 to 36.9 in adult ([place])   8. OSA on CPAP   9. 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 Screening for colorectal cancer  - POC Hemoccult Bld/Stl  12. Prostate cancer screening  - PSA  13. Screening examination for pulmonary tuberculosis  - TB Skin Test  14. Screening for ischemic heart disease  - EKG 12-Lead  15. FHx: heart disease  - EKG 12-Lead  16. Fatigue  - Iron,Total/Total Iron Binding Cap - Vitamin B12 - CBC with Differential/Platelet - TSH  17. Medication management  - Urinalysis, Routine w reflex microscopic - Microalbumin / creatinine urine ratio - Testosterone - CBC with Differential/Platelet - COMPLETE METABOLIC PANEL WITH GFR - Magnesium - Lipid panel - TSH - Hemoglobin A1c - Insulin, random - VITAMIN D 25 Hydroxy          Patient was counseled in prudent diet, weight control to achieve/maintain BMI less than 25, BP monitoring, regular exercise and medications as discussed.  Discussed med effects and SE's. Routine screening labs and tests as requested with regular follow-up as recommended. Over 40 minutes of exam, counseling, chart review and high complex critical decision making was performed   Kirtland Bouchard, MD

## 2020-01-05 NOTE — Patient Instructions (Signed)
- Link to sign up at [employer] site for the Covid-19 vaccine   [email or web address].[email or web address]   Or call [phone]  ++++++++++++++++++++++++++++++++++ Vit D  & Vit C 1,000 mg   are recommended to help protect  against the Covid-19 and other Corona viruses.    Also it's recommended  to take  Zinc 50 mg  to help  protect against the Covid-19   and best place to get  is also on [email or web address]  and don't pay more than 6-8 cents /pill !  =============================== Coronavirus (COVID-19) Are you at risk?  Are you at risk for the Coronavirus (COVID-19)?  To be considered HIGH RISK for Coronavirus (COVID-19), you have to meet the following criteria:  . Traveled to [place], [place], [place], [place] or [place]; or in the [place] to [place], [place], [place]  . or [place]; and have fever, cough, and shortness of breath within the last 2 weeks of travel OR . Been in close contact with a person diagnosed with COVID-19 within the last 2 weeks and have  . fever, cough,and shortness of breath .  . IF YOU DO NOT MEET THESE CRITERIA, YOU ARE CONSIDERED LOW RISK FOR COVID-19.  What to do if you are HIGH RISK for COVID-19?  Marland Kitchen If you are having a medical emergency, call 911. . Seek medical care right away. Before you go to a doctor's office, urgent care or emergency department, .  call ahead and tell them about your recent travel, contact with someone diagnosed with COVID-19  .  and your symptoms.  . You should receive instructions from your physician's office regarding next steps of care.  . When you arrive at healthcare provider, tell the healthcare staff immediately you have returned from  . visiting [place], [place], [place], [place] or [place]; or traveled in the [place] to [place], [place],  . [place] or [place] in the last two  weeks or you have been in close contact with a person diagnosed with  . COVID-19 in the last 2 weeks.   . Tell the health care staff about your symptoms: fever, cough and shortness of breath. . After you have been seen by a medical provider, you will be either: o Tested for (COVID-19) and discharged home on quarantine except to seek medical care if  o symptoms worsen, and asked to  - Stay home and avoid contact with others until you get your results (4-5 days)  - Avoid travel on public transportation if possible (such as bus, train, or airplane) or o Sent to the Emergency Department by EMS for evaluation, COVID-19 testing  and  o possible admission depending on your condition and test results.  What to do if you are LOW RISK for COVID-19?  Reduce your risk of any infection by using the same precautions used for avoiding the common cold or flu:  Marland Kitchen Wash your hands often with soap and warm water for at least 20 seconds.  If soap and water are not readily available,  . use an alcohol-based hand sanitizer with at least 60% alcohol.  . If coughing or sneezing, cover your mouth and nose by coughing or sneezing into the elbow areas of your shirt or coat, .  into a tissue or into your sleeve (not your hands). . Avoid shaking hands with others and consider head nods or verbal greetings only. . Avoid touching your eyes, nose, or mouth with unwashed hands.  Marland Kitchen  Avoid close contact with people who are sick. . Avoid places or events with large numbers of people in one location, like concerts or sporting events. . Carefully consider travel plans you have or are making. . If you are planning any travel outside or inside the [place], visit the CDC's Travelers' Health webpage for the latest health notices. . If you have some symptoms but not all symptoms, continue to monitor at home and seek medical attention  . if your symptoms worsen. . If you are having a medical emergency, call  911. >>>>>>>>>>>>>>>>>>>>>>>>>>>> Preventive Care for Adults  A healthy lifestyle and preventive care can promote health and wellness. Preventive health guidelines for men include the following key practices:  A routine yearly physical is a good way to check with your health care provider about your health and preventative screening. It is a chance to share any concerns and updates on your health and to receive a thorough exam.  Visit your dentist for a routine exam and preventative care every 6 months. Brush your teeth twice a day and floss once a day. Good oral hygiene prevents tooth decay and gum disease.  The frequency of eye exams is based on your age, health, family medical history, use of contact lenses, and other factors. Follow your health care provider's recommendations for frequency of eye exams.  Eat a healthy diet. Foods such as vegetables, fruits, whole grains, low-fat dairy products, and lean protein foods contain the nutrients you need without too many calories. Decrease your intake of foods high in solid fats, added sugars, and salt. Eat the right amount of calories for you. Get information about a proper diet from your health care provider, if necessary.  Regular physical exercise is one of the most important things you can do for your health. Most adults should get at least 150 minutes of moderate-intensity exercise (any activity that increases your heart rate and causes you to sweat) each week. In addition, most adults need muscle-strengthening exercises on 2 or more days a week.  Maintain a healthy weight. The body mass index (BMI) is a screening tool to identify possible weight problems. It provides an estimate of body fat based on height and weight. Your health care provider can find your BMI and can help you achieve or maintain a healthy weight. For adults 20 years and older:  A BMI below 18.5 is considered underweight.  A BMI of 18.5 to 24.9 is normal.  A BMI of 25 to  29.9 is considered overweight.  A BMI of 30 and above is considered obese.  Maintain normal blood lipids and cholesterol levels by exercising and minimizing your intake of saturated fat. Eat a balanced diet with plenty of fruit and vegetables. Blood tests for lipids and cholesterol should begin at age 57 and be repeated every 5 years. If your lipid or cholesterol levels are high, you are over 50, or you are at high risk for heart disease, you may need your cholesterol levels checked more frequently. Ongoing high lipid and cholesterol levels should be treated with medicines if diet and exercise are not working.  If you smoke, find out from your health care provider how to quit. If you do not use tobacco, do not start.  Lung cancer screening is recommended for adults aged 54-80 years who are at high risk for developing lung cancer because of a history of smoking. A yearly low-dose CT scan of the lungs is recommended for people who have at least a 30-pack-year  history of smoking and are a current smoker or have quit within the past 15 years. A pack year of smoking is smoking an average of 1 pack of cigarettes a day for 1 year (for example: 1 pack a day for 30 years or 2 packs a day for 15 years). Yearly screening should continue until the smoker has stopped smoking for at least 15 years. Yearly screening should be stopped for people who develop a health problem that would prevent them from having lung cancer treatment.  If you choose to drink alcohol, do not have more than 2 drinks per day. One drink is considered to be 12 ounces (355 mL) of beer, 5 ounces (148 mL) of wine, or 1.5 ounces (44 mL) of liquor.  Avoid use of street drugs. Do not share needles with anyone. Ask for help if you need support or instructions about stopping the use of drugs.  High blood pressure causes heart disease and increases the risk of stroke. Your blood pressure should be checked at least every 1-2 years. Ongoing high blood  pressure should be treated with medicines, if weight loss and exercise are not effective.  If you are 59-49 years old, ask your health care provider if you should take aspirin to prevent heart disease.  Diabetes screening involves taking a blood sample to check your fasting blood sugar level. This should be done once every 3 years, after age 56, if you are within normal weight and without risk factors for diabetes. Testing should be considered at a younger age or be carried out more frequently if you are overweight and have at least 1 risk factor for diabetes.  Colorectal cancer can be detected and often prevented. Most routine colorectal cancer screening begins at the age of 18 and continues through age 48. However, your health care provider may recommend screening at an earlier age if you have risk factors for colon cancer. On a yearly basis, your health care provider may provide home test kits to check for hidden blood in the stool. Use of a small camera at the end of a tube to directly examine the colon (sigmoidoscopy or colonoscopy) can detect the earliest forms of colorectal cancer. Talk to your health care provider about this at age 75, when routine screening begins. Direct exam of the colon should be repeated every 5-10 years through age 77, unless early forms of precancerous polyps or small growths are found.   Talk with your health care provider about prostate cancer screening.  Testicular cancer screening isrecommended for adult males. Screening includes self-exam, a health care provider exam, and other screening tests. Consult with your health care provider about any symptoms you have or any concerns you have about testicular cancer.  Use sunscreen. Apply sunscreen liberally and repeatedly throughout the day. You should seek shade when your shadow is shorter than you. Protect yourself by wearing long sleeves, pants, a wide-brimmed hat, and sunglasses year round, whenever you are  outdoors.  Once a month, do a whole-body skin exam, using a mirror to look at the skin on your back. Tell your health care provider about new moles, moles that have irregular borders, moles that are larger than a pencil eraser, or moles that have changed in shape or color.  Stay current with required vaccines (immunizations).  Influenza vaccine. All adults should be immunized every year.  Tetanus, diphtheria, and acellular pertussis (Td, Tdap) vaccine. An adult who has not previously received Tdap or who does not know his vaccine  status should receive 1 dose of Tdap. This initial dose should be followed by tetanus and diphtheria toxoids (Td) booster doses every 10 years. Adults with an unknown or incomplete history of completing a 3-dose immunization series with Td-containing vaccines should begin or complete a primary immunization series including a Tdap dose. Adults should receive a Td booster every 10 years.  Varicella vaccine. An adult without evidence of immunity to varicella should receive 2 doses or a second dose if he has previously received 1 dose.  Human papillomavirus (HPV) vaccine. Males aged 64-21 years who have not received the vaccine previously should receive the 3-dose series. Males aged 22-26 years may be immunized. Immunization is recommended through the age of 54 years for any male who has sex with males and did not get any or all doses earlier. Immunization is recommended for any person with an immunocompromised condition through the age of 71 years if he did not get any or all doses earlier. During the 3-dose series, the second dose should be obtained 4-8 weeks after the first dose. The third dose should be obtained 24 weeks after the first dose and 16 weeks after the second dose.  Zoster vaccine. One dose is recommended for adults aged 73 years or older unless certain conditions are present.    PREVNAR  - Pneumococcal 13-valent conjugate (PCV13) vaccine. When indicated, a  person who is uncertain of his immunization history and has no record of immunization should receive the PCV13 vaccine. An adult aged 40 years or older who has certain medical conditions and has not been previously immunized should receive 1 dose of PCV13 vaccine. This PCV13 should be followed with a dose of pneumococcal polysaccharide (PPSV23) vaccine. The PPSV23 vaccine dose should be obtained at least 1 r more year(s) after the dose of PCV13 vaccine. An adult aged 71 years or older who has certain medical conditions and previously received 1 or more doses of PPSV23 vaccine should receive 1 dose of PCV13. The PCV13 vaccine dose should be obtained 1 or more years after the last PPSV23 vaccine dose.    PNEUMOVAX - Pneumococcal polysaccharide (PPSV23) vaccine. When PCV13 is also indicated, PCV13 should be obtained first. All adults aged 20 years and older should be immunized. An adult younger than age 59 years who has certain medical conditions should be immunized. Any person who resides in a nursing home or long-term care facility should be immunized. An adult smoker should be immunized. People with an immunocompromised condition and certain other conditions should receive both PCV13 and PPSV23 vaccines. People with human immunodeficiency virus (HIV) infection should be immunized as soon as possible after diagnosis. Immunization during chemotherapy or radiation therapy should be avoided. Routine use of PPSV23 vaccine is not recommended for American Indians, [place] Natives, or people younger than 65 years unless there are medical conditions that require PPSV23 vaccine. When indicated, people who have unknown immunization and have no record of immunization should receive PPSV23 vaccine. One-time revaccination 5 years after the first dose of PPSV23 is recommended for people aged 19-64 years who have chronic kidney failure, nephrotic syndrome, asplenia, or immunocompromised conditions. People who received 1-2 doses  of PPSV23 before age 54 years should receive another dose of PPSV23 vaccine at age 53 years or later if at least 5 years have passed since the previous dose. Doses of PPSV23 are not needed for people immunized with PPSV23 at or after age 52 years.    Hepatitis A vaccine. Adults who wish to be  protected from this disease, have certain high-risk conditions, work with hepatitis A-infected animals, work in hepatitis A research labs, or travel to or work in countries with a high rate of hepatitis A should be immunized. Adults who were previously unvaccinated and who anticipate close contact with an international adoptee during the first 60 days after arrival in the [place] from a country with a high rate of hepatitis A should be immunized.    Hepatitis B vaccine. Adults should be immunized if they wish to be protected from this disease, have certain high-risk conditions, may be exposed to blood or other infectious body fluids, are household contacts or sex partners of hepatitis B positive people, are clients or workers in certain care facilities, or travel to or work in countries with a high rate of hepatitis B.   Preventive Service / Frequency   Ages 59 to 35  Blood pressure check.  Lipid and cholesterol check  Lung cancer screening. / Every year if you are aged 60-80 years and have a 30-pack-year history of smoking and currently smoke or have quit within the past 15 years. Yearly screening is stopped once you have quit smoking for at least 15 years or develop a health problem that would prevent you from having lung cancer treatment.  Fecal occult blood test (FOBT) of stool. / Every year beginning at age 45 and continuing until age 61. You may not have to do this test if you get a colonoscopy every 10 years.  Flexible sigmoidoscopy** or colonoscopy.** / Every 5 years for a flexible sigmoidoscopy or every 10 years for a colonoscopy beginning at age 19 and continuing until age 15. Screening  for abdominal aortic aneurysm (AAA)  by ultrasound is recommended for people who have history of high blood pressure or who are current or former smokers. +++++++++++ Recommend Adult Low Dose Aspirin or  coated  Aspirin 81 mg daily  To reduce risk of Colon Cancer 40 %,  Skin Cancer 26 % ,  Malignant Melanoma 46%  and  Pancreatic cancer 60% ++++++++++++++++++++ Vitamin D goal  is between 70-100.  Please make sure that you are taking your Vitamin D as directed.  It is very important as a natural anti-inflammatory  helping hair, skin, and nails, as well as reducing stroke and heart attack risk.  It helps your bones and helps with mood. It also decreases numerous cancer risks so please take it as directed.  Low Vit D is associated with a 200-300% higher risk for CANCER  and 200-300% higher risk for HEART   ATTACK  &  STROKE.   .....................................Marland Kitchen It is also associated with higher death rate at younger ages,  autoimmune diseases like Rheumatoid arthritis, Lupus, Multiple Sclerosis.    Also many other serious conditions, like depression, Alzheimer's Dementia, infertility, muscle aches, fatigue, fibromyalgia - just to name a few. +++++++++++++++++++++ Recommend the book "The END of DIETING" by Dr Excell Seltzer  & the book "The END of DIABETES " by Dr Excell Seltzer At [email or web address] - get book & Audio CD's    Being diabetic has a  300% increased risk for heart attack, stroke, cancer, and alzheimer- type vascular dementia. It is very important that you work harder with diet by avoiding all foods that are white. Avoid white rice (brown & wild rice is OK), white potatoes (sweetpotatoes in moderation is OK), White bread or wheat bread or anything made out of white flour like bagels, donuts, rolls, buns, biscuits, cakes, pastries, cookies, pizza  crust, and pasta (made from white flour & egg whites) - vegetarian pasta or spinach or wheat pasta is OK. Multigrain breads like Arnold's or  [employer], or multigrain sandwich thins or flatbreads.  Diet, exercise and weight loss can reverse and cure diabetes in the early stages.  Diet, exercise and weight loss is very important in the control and prevention of complications of diabetes which affects every system in your body, ie. Brain - dementia/stroke, eyes - glaucoma/blindness, heart - heart attack/heart failure, kidneys - dialysis, stomach - gastric paralysis, intestines - malabsorption, nerves - severe painful neuritis, circulation - gangrene & loss of a leg(s), and finally cancer and Alzheimers.    I recommend avoid fried & greasy foods,  sweets/candy, white rice (brown or wild rice or Quinoa is OK), white potatoes (sweet potatoes are OK) - anything made from white flour - bagels, doughnuts, rolls, buns, biscuits,white and wheat breads, pizza crust and traditional pasta made of white flour & egg white(vegetarian pasta or spinach or wheat pasta is OK).  Multi-grain bread is OK - like multi-grain flat bread or sandwich thins. Avoid alcohol in excess. Exercise is also important.    Eat all the vegetables you want - avoid meat, especially red meat and dairy - especially cheese.  Cheese is the most concentrated form of trans-fats which is the worst thing to clog up our arteries. Veggie cheese is OK which can be found in the fresh produce section at [employer] or [employer] or [employer]  ++++++++++++++++++++++ DASH Eating Plan  DASH stands for "Dietary Approaches to Stop Hypertension."   The DASH eating plan is a healthy eating plan that has been shown to reduce high blood pressure (hypertension). Additional health benefits may include reducing the risk of type 2 diabetes mellitus, heart disease, and stroke. The DASH eating plan may also help with weight loss. WHAT DO I NEED TO KNOW ABOUT THE DASH EATING PLAN? For the DASH eating plan, you will follow these general guidelines:  Choose foods with a percent daily value for sodium of  less than 5% (as listed on the food label).  Use salt-free seasonings or herbs instead of table salt or sea salt.  Check with your health care provider or pharmacist before using salt substitutes.  Eat lower-sodium products, often labeled as "lower sodium" or "no salt added."  Eat fresh foods.  Eat more vegetables, fruits, and low-fat dairy products.  Choose whole grains. Look for the word "whole" as the first word in the ingredient list.  Choose fish   Limit sweets, desserts, sugars, and sugary drinks.  Choose heart-healthy fats.  Eat veggie cheese   Eat more home-cooked food and less restaurant, buffet, and fast food.  Limit fried foods.  Cook foods using methods other than frying.  Limit canned vegetables. If you do use them, rinse them well to decrease the sodium.  When eating at a restaurant, ask that your food be prepared with less salt, or no salt if possible.                      WHAT FOODS CAN I EAT? Read Dr Fara Olden Fuhrman's books on The End of Dieting & The End of Diabetes  Grains Whole grain or whole wheat bread. Brown rice. Whole grain or whole wheat pasta. Quinoa, bulgur, and whole grain cereals. Low-sodium cereals. Corn or whole wheat flour tortillas. Whole grain cornbread. Whole grain crackers. Low-sodium crackers.  Vegetables Fresh or frozen vegetables (raw, steamed, roasted,  or grilled). Low-sodium or reduced-sodium tomato and vegetable juices. Low-sodium or reduced-sodium tomato sauce and paste. Low-sodium or reduced-sodium canned vegetables.   Fruits All fresh, canned (in natural juice), or frozen fruits.  Protein Products  All fish and seafood.  Dried beans, peas, or lentils. Unsalted nuts and seeds. Unsalted canned beans.  Dairy Low-fat dairy products, such as skim or 1% milk, 2% or reduced-fat cheeses, low-fat ricotta or cottage cheese, or plain low-fat yogurt. Low-sodium or reduced-sodium cheeses.  Fats and Oils Tub margarines without trans  fats. Light or reduced-fat mayonnaise and salad dressings (reduced sodium). Avocado. Safflower, olive, or canola oils. Natural peanut or almond butter.  Other Unsalted popcorn and pretzels. The items listed above may not be a complete list of recommended foods or beverages. Contact your dietitian for more options.  +++++++++++++++++++  WHAT FOODS ARE NOT RECOMMENDED? Grains/ White flour or wheat flour White bread. White pasta. White rice. Refined cornbread. Bagels and croissants. Crackers that contain trans fat.  Vegetables  Creamed or fried vegetables. Vegetables in a . Regular canned vegetables. Regular canned tomato sauce and paste. Regular tomato and vegetable juices.  Fruits Dried fruits. Canned fruit in light or heavy syrup. Fruit juice.  Meat and Other Protein Products Meat in general - RED meat & White meat.  Fatty cuts of meat. Ribs, chicken wings, all processed meats as bacon, sausage, bologna, salami, fatback, hot dogs, bratwurst and packaged luncheon meats.  Dairy Whole or 2% milk, cream, half-and-half, and cream cheese. Whole-fat or sweetened yogurt. Full-fat cheeses or blue cheese. Non-dairy creamers and whipped toppings. Processed cheese, cheese spreads, or cheese curds.  Condiments Onion and garlic salt, seasoned salt, table salt, and sea salt. Canned and packaged gravies. Worcestershire sauce. Tartar sauce. Barbecue sauce. Teriyaki sauce. Soy sauce, including reduced sodium. Steak sauce. Fish sauce. Oyster sauce. Cocktail sauce. Horseradish. Ketchup and mustard. Meat flavorings and tenderizers. Bouillon cubes. Hot sauce. Tabasco sauce. Marinades. Taco seasonings. Relishes.  Fats and Oils Butter, stick margarine, lard, shortening and bacon fat. Coconut, palm kernel, or palm oils. Regular salad dressings.  Pickles and olives. Salted popcorn and pretzels.  The items listed above may not be a complete list of foods and beverages to avoid.

## 2020-01-06 LAB — HEMOGLOBIN A1C
Hgb A1c MFr Bld: 5.8 % of total Hgb — ABNORMAL HIGH (ref <5.7)
Mean Plasma Glucose: 120 (calc)
eAG (mmol/L): 6.6 (calc)

## 2020-01-06 LAB — CBC WITH DIFFERENTIAL/PLATELET
Absolute Monocytes: 445 cells/uL (ref 200–950)
Basophils Absolute: 53 cells/uL (ref 0–200)
Basophils Relative: 1 %
Eosinophils Absolute: 122 cells/uL (ref 15–500)
Eosinophils Relative: 2.3 %
HCT: 54.4 % — ABNORMAL HIGH (ref 38.5–50.0)
Hemoglobin: 18.1 g/dL — ABNORMAL HIGH (ref 13.2–17.1)
Lymphs Abs: 1754 cells/uL (ref 850–3900)
MCH: 27 pg (ref 27.0–33.0)
MCHC: 33.3 g/dL (ref 32.0–36.0)
MCV: 81.2 fL (ref 80.0–100.0)
MPV: 10.4 fL (ref 7.5–12.5)
Monocytes Relative: 8.4 %
Neutro Abs: 2926 cells/uL (ref 1500–7800)
Neutrophils Relative %: 55.2 %
Platelets: 236 10*3/uL (ref 140–400)
RBC: 6.7 10*6/uL — ABNORMAL HIGH (ref 4.20–5.80)
RDW: 14.2 % (ref 11.0–15.0)
Total Lymphocyte: 33.1 %
WBC: 5.3 10*3/uL (ref 3.8–10.8)

## 2020-01-06 LAB — LIPID PANEL
Cholesterol: 130 mg/dL (ref <200)
HDL: 30 mg/dL — ABNORMAL LOW (ref >=40)
LDL Cholesterol (Calc): 74 mg/dL (calc)
Non-HDL Cholesterol (Calc): 100 mg/dL (calc) (ref <130)
Total CHOL/HDL Ratio: 4.3 (calc) (ref <5.0)
Triglycerides: 162 mg/dL — ABNORMAL HIGH (ref <150)

## 2020-01-06 LAB — URINALYSIS, ROUTINE W REFLEX MICROSCOPIC
Bilirubin Urine: NEGATIVE
Glucose, UA: NEGATIVE
Hgb urine dipstick: NEGATIVE
Ketones, ur: NEGATIVE
Leukocytes,Ua: NEGATIVE
Nitrite: NEGATIVE
Protein, ur: NEGATIVE
Specific Gravity, Urine: 1.022 (ref 1.001–1.03)
pH: 7 (ref 5.0–8.0)

## 2020-01-06 LAB — COMPLETE METABOLIC PANEL WITH GFR
AG Ratio: 2.1 (calc) (ref 1.0–2.5)
ALT: 42 U/L (ref 9–46)
AST: 36 U/L (ref 10–40)
Albumin: 4.8 g/dL (ref 3.6–5.1)
Alkaline phosphatase (APISO): 45 U/L (ref 36–130)
BUN: 14 mg/dL (ref 7–25)
CO2: 25 mmol/L (ref 20–32)
Calcium: 9.8 mg/dL (ref 8.6–10.3)
Chloride: 106 mmol/L (ref 98–110)
Creat: 1.34 mg/dL (ref 0.60–1.35)
GFR, Est African American: 72 mL/min/{1.73_m2} (ref >=60)
GFR, Est Non African American: 62 mL/min/{1.73_m2} (ref >=60)
Globulin: 2.3 g/dL (calc) (ref 1.9–3.7)
Glucose, Bld: 102 mg/dL — ABNORMAL HIGH (ref 65–99)
Potassium: 4.3 mmol/L (ref 3.5–5.3)
Sodium: 139 mmol/L (ref 135–146)
Total Bilirubin: 0.5 mg/dL (ref 0.2–1.2)
Total Protein: 7.1 g/dL (ref 6.1–8.1)

## 2020-01-06 LAB — VITAMIN B12: Vitamin B-12: 384 pg/mL (ref 200–1100)

## 2020-01-06 LAB — IRON, TOTAL/TOTAL IRON BINDING CAP
%SAT: 25 % (calc) (ref 20–48)
Iron: 73 ug/dL (ref 50–180)
TIBC: 294 mcg/dL (calc) (ref 250–425)

## 2020-01-06 LAB — INSULIN, RANDOM: Insulin: 37.1 u[IU]/mL — ABNORMAL HIGH

## 2020-01-06 LAB — VITAMIN D 25 HYDROXY (VIT D DEFICIENCY, FRACTURES): Vit D, 25-Hydroxy: 76 ng/mL (ref 30–100)

## 2020-01-06 LAB — MICROALBUMIN / CREATININE URINE RATIO
Creatinine, Urine: 178 mg/dL (ref 20–320)
Microalb Creat Ratio: 3 mcg/mg creat (ref <30)
Microalb, Ur: 0.6 mg/dL

## 2020-01-06 LAB — TESTOSTERONE: Testosterone: 1028 ng/dL — ABNORMAL HIGH (ref 250–827)

## 2020-01-06 LAB — MAGNESIUM: Magnesium: 2 mg/dL (ref 1.5–2.5)

## 2020-01-06 LAB — PSA: PSA: 0.4 ng/mL (ref <=4.0)

## 2020-01-06 LAB — TSH: TSH: 0.6 mIU/L (ref 0.40–4.50)

## 2020-01-09 ENCOUNTER — Encounter: Payer: Self-pay | Admitting: Internal Medicine

## 2020-02-22 ENCOUNTER — Other Ambulatory Visit: Payer: Self-pay

## 2020-02-22 ENCOUNTER — Encounter (INDEPENDENT_AMBULATORY_CARE_PROVIDER_SITE_OTHER): Payer: Self-pay | Admitting: Otolaryngology

## 2020-02-22 ENCOUNTER — Ambulatory Visit (INDEPENDENT_AMBULATORY_CARE_PROVIDER_SITE_OTHER): Payer: 59 | Admitting: Otolaryngology

## 2020-02-22 VITALS — Temp 97.7°F

## 2020-02-22 DIAGNOSIS — G4733 Obstructive sleep apnea (adult) (pediatric): Secondary | ICD-10-CM | POA: Diagnosis not present

## 2020-02-22 NOTE — Progress Notes (Signed)
HPI: Mike Cohen is a 49 y.o. male who presents is referred by his PCP for evaluation of obstructive sleep apnea.  Patient has been using nasal CPAP for over 8 years.  He needs to see about getting a new machine.  He is working on losing weight.  He also inquires about an implant used for OSA.  He uses nasal steroid spray and does not complain of that much trouble breathing through his nose.Marland Kitchen  Past Medical History:  Diagnosis Date  . Hyperlipidemia   . Hypertension   . Other testicular hypofunction   . Prediabetes    Past Surgical History:  Procedure Laterality Date  . ROTATOR CUFF REPAIR Right 2007  . SHOULDER ARTHROSCOPY Right 2000   Social History   Socioeconomic History  . Marital status: Married    Spouse name: Not on file  . Number of children: Not on file  . Years of education: Not on file  . Highest education level: Not on file  Occupational History  . Not on file  Tobacco Use  . Smoking status: Never Smoker  . Smokeless tobacco: Never Used  . Tobacco comment: occasional cigar  Substance and Sexual Activity  . Alcohol use: Yes    Alcohol/week: 0.0 standard drinks    Comment: occasional  . Drug use: No  . Sexual activity: Not on file  Other Topics Concern  . Not on file  Social History Narrative  . Not on file   Social Determinants of Health   Financial Resource Strain:   . Difficulty of Paying Living Expenses:   Food Insecurity:   . Worried About Charity fundraiser in the Last Year:   . Arboriculturist in the Last Year:   Transportation Needs:   . Film/video editor (Medical):   Marland Kitchen Lack of Transportation (Non-Medical):   Physical Activity:   . Days of Exercise per Week:   . Minutes of Exercise per Session:   Stress:   . Feeling of Stress :   Social Connections:   . Frequency of Communication with Friends and Family:   . Frequency of Social Gatherings with Friends and Family:   . Attends Religious Services:   . Active Member of Clubs or  Organizations:   . Attends Archivist Meetings:   Marland Kitchen Marital Status:    Family History  Problem Relation Age of Onset  . Heart disease Father   . Hyperlipidemia Father   . Hypertension Father    No Known Allergies Prior to Admission medications   Medication Sig Start Date End Date Taking? Authorizing Provider  aspirin EC 81 MG tablet Take 81 mg by mouth daily.   Yes [provider]  Cholecalciferol (VITAMIN D PO) Take 2,000 Int'l Units by mouth daily.   Yes [provider]  cyclobenzaprine (FLEXERIL) 10 MG tablet Take 1/2 to 1 tablet 3 x /day as needed for Muscle Spasm 01/19/19  Yes Unk Pinto, MD  ipratropium (ATROVENT) 0.06 % nasal spray Use 1 to 2 sprays each nostril 2 to 3 x /day as needed for watery nasal drainage 01/05/20  Yes Unk Pinto, MD  losartan-hydrochlorothiazide [facility]) 50-12.5 MG tablet Take 1 tablet Daily for BP 11/03/19  Yes Unk Pinto, MD  minocycline (MINOCIN) 50 MG capsule Take 50 mg by mouth 2 (two) times daily. 12/12/19  Yes [provider]  Multiple Vitamin (MULTIVITAMIN) tablet Take 1 tablet by mouth daily.   Yes [provider]  phentermine (ADIPEX-P) 37.5 MG tablet  Take 1/2 to 1 tablet every morning for Dieting & Weight  Loss 06/26/19  Yes Unk Pinto, MD  rosuvastatin (CRESTOR) 40 MG tablet Take 1/2 to 1 tablet daily or as directed for Cholesterol 07/09/19  Yes Unk Pinto, MD  sildenafil (VIAGRA) 100 MG tablet Take 1/2 to 1 tablet Daily as needed for XXXX 11/03/19  Yes Unk Pinto, MD  SYRINGE-NEEDLE, DISP, 3 ML (B-D 3CC LUER-LOK SYR 21GX1-1/2) 21G X 1-1/2" 3 ML MISC Test sugar once daily 06/14/15  Yes Vicie Mutters, PA-C  testosterone cypionate (DEPOTESTOSTERONE CYPIONATE) 200 MG/ML injection INJECT 2 ML'S INTO THE MUSCLE EVERY 2 WEEKS. 09/28/19  Yes Unk Pinto, MD  topiramate (TOPAMAX) 50 MG tablet Take 1 tablet 2 x / day at Suppertime & Bedtime for Dieting & Weight Loss 06/26/19  Yes  Unk Pinto, MD     Positive ROS: Otherwise negative  All other systems have been reviewed and were otherwise negative with the exception of those mentioned in the HPI and as above.  Physical Exam: Constitutional: Alert, well-appearing, no acute distress Ears: External ears without lesions or tenderness. Ear canals are clear bilaterally with intact, clear TMs.  Nasal: External nose without lesions. Septum with slight deviation and mild turbid upper trophy.. Clear nasal passages otherwise with no signs of infection.  No polyps. Oral: Lips and gums without lesions. Tongue and palate mucosa without lesions. Posterior oropharynx clear.  Average sized tonsils with moderate redundant mucosa. Indirect laryngoscopy revealed a clear base of tongue vallecula and epiglottis. Neck: No palpable adenopathy or masses Respiratory: Breathing comfortably  Skin: No facial/neck lesions or rash noted.  Procedures  Assessment: Obstructive sleep apnea  Plan: We will plan on arranging for him to obtain a new CPAP machine.  He will call us back with the company he wants to use which apparently is located in [facility]. I will plan on contacting Dr. Redmond Baseman who performs the hypoglossal nerve implant for OSA to discuss this option with him.   Radene Journey, MD   CC:

## 2020-02-23 ENCOUNTER — Other Ambulatory Visit (INDEPENDENT_AMBULATORY_CARE_PROVIDER_SITE_OTHER): Payer: Self-pay

## 2020-02-25 ENCOUNTER — Other Ambulatory Visit (INDEPENDENT_AMBULATORY_CARE_PROVIDER_SITE_OTHER): Payer: Self-pay

## 2020-02-25 DIAGNOSIS — G4733 Obstructive sleep apnea (adult) (pediatric): Secondary | ICD-10-CM

## 2020-03-01 ENCOUNTER — Telehealth (INDEPENDENT_AMBULATORY_CARE_PROVIDER_SITE_OTHER): Payer: Self-pay

## 2020-03-22 ENCOUNTER — Other Ambulatory Visit: Payer: Self-pay

## 2020-03-22 MED ORDER — SILDENAFIL CITRATE 100 MG PO TABS: ORAL_TABLET | ORAL | 0 refills | Status: DC

## 2020-03-23 ENCOUNTER — Other Ambulatory Visit: Payer: Self-pay | Admitting: Internal Medicine

## 2020-03-23 DIAGNOSIS — E782 Mixed hyperlipidemia: Secondary | ICD-10-CM

## 2020-04-07 NOTE — Progress Notes (Signed)
FOLLOW UP  Assessment and Plan:   Hypertension Continue current medications Check labs next visit CMP/GFR Monitor blood pressure at home; patient to call if consistently greater than 130/80 Continue DASH diet.   Reminder to go to the ER if any CP, SOB, nausea, dizziness, severe HA, changes vision/speech, left arm numbness and tingling and jaw pain.  Cholesterol Currently borderline LDL goal not on statin therapy Continue low cholesterol diet and exercise.  Check lipid panel.   Prediabetes Working on lifestyle modification; borderline diabetic Continue diet and exercise.  Perform daily foot/skin check, notify office of any concerning changes.  Check A1C q42m, defer to next visit; monitor weight, serum glucose   Obesity with co morbidities Long discussion about weight loss, diet, and exercise He reports significant weight progress per home scale; attributes today weight to uniform and vest Recommended diet heavy in fruits and veggies and low in animal meats, cheeses, and dairy products, appropriate calorie intake Discussed ideal weight for height, initial goal <230lb Patient on phentermine with benefit and no SE, taking drug breaks; continue close follow up. Will follow up in 3 months  Vitamin D Def At goal at last visit; continue supplementation to maintain goal of 60-100 Defer Vit D level  Hypogonadism - continue to monitor, states medication is helping with symptoms of low T.  Sildenafil PRN   Elevated LFTs Intermittent, ongoing, had normal iron/ferritin Hasn't had hepatitis screening, [place] discussed and will proceed with this today Suspect fatty liver with obesity and prediabetes  Continue diet and meds as discussed. Further disposition pending results of labs. Discussed med's effects and SE's.   Over 30 minutes of exam, counseling, chart review, and critical decision making was performed.   Future Appointments  Date Time Provider [place]  07/15/2020 10:30 AM  Unk Pinto, MD [facility] None  01/19/2021  2:00 PM Unk Pinto, MD [facility] None    ----------------------------------------------------------------------------------------------------------------------  HPI 49 y.o. male  presents for 3 month follow up on hypertension, cholesterol, prediabetes, obesity and vitamin D deficiency.   he is prescribed phentermine/topamax for weight loss; he started but then stopped phentermine.  While on the medication they have lost 0 lbs since last visit. They deny palpitations, anxiety, trouble sleeping, elevated BP. He has estimated 20 lb of equipment on today Licensed conveyancer). Reports weight at home this AM was 241 lb.   BMI is Body mass index is 37.45 kg/m., he has been working on diet and exercise, but admits he could be doing better. He reports he is changing positions and is under a lot of stress, also trying to hire a Materials engineer. He is  eat out less, bringing a packed lunch and fruit with him to work. Drinking more water. Not keeping soda in the house. He was exercising, going to gym but hasn't been able to due to covid 19. On home scales he is down from 260 lb to around 245 lb as of a few weeks ago.  Wt Readings from Last 3 Encounters:  04/08/20 261 lb (118.4 kg)  01/05/20 251 lb (113.9 kg)  03/26/19 273 lb (123.8 kg)    He does not have BP cuff, today their BP is BP: 124/82  He does currently workout, walks a lot at work, doing rowing and getting stationary bike.  He denies chest pain, shortness of breath, dizziness.   He is on cholesterol medication (rosuvastatin 40 mg daily) and denies myalgias. His LDL cholesterol is at goal, though trigs remain elevated. The cholesterol last visit  was:   Lab Results  Component Value Date   CHOL 130 01/05/2020   HDL 30 (L) 01/05/2020   LDLCALC 74 01/05/2020   TRIG 162 (H) 01/05/2020   CHOLHDL 4.3 01/05/2020    He has been working on diet and exercise for prediabetes,  working aggressively on  lifestyle, and denies foot ulcerations, increased appetite, nausea, paresthesia of the feet, polydipsia, polyuria, visual disturbances, vomiting and weight loss. Last A1C in the office was:  Lab Results  Component Value Date   HGBA1C 5.8 (H) 01/05/2020   Last GFR:  Lab Results  Component Value Date   GFRAA 72 01/05/2020   Patient is on Vitamin D supplement.   Lab Results  Component Value Date   VD25OH 76 01/05/2020     He has a history of testosterone deficiency and is on testosterone replacement, taking 200 mg once a week, last shot 1 week ago. He states that the testosterone helps with his energy, libido, muscle mass. Lab Results  Component Value Date   TESTOSTERONE 1,028 (H) 01/05/2020      Current Medications:  Current Outpatient Medications on File Prior to Visit  Medication Sig  . aspirin EC 81 MG tablet Take 81 mg by mouth daily.  . Cholecalciferol (VITAMIN D PO) Take 2,000 Int'l Units by mouth daily.  . cyclobenzaprine (FLEXERIL) 10 MG tablet Take 1/2 to 1 tablet 3 x /day as needed for Muscle Spasm  . ipratropium (ATROVENT) 0.06 % nasal spray Use 1 to 2 sprays each nostril 2 to 3 x /day as needed for watery nasal drainage  . losartan-hydrochlorothiazide (HYZAAR) 50-12.5 MG tablet Take 1 tablet Daily for BP  . minocycline (MINOCIN) 50 MG capsule Take 50 mg by mouth 2 (two) times daily.  . Multiple Vitamin (MULTIVITAMIN) tablet Take 1 tablet by mouth daily.  . phentermine (ADIPEX-P) 37.5 MG tablet Take 1/2 to 1 tablet every morning for Dieting & Weight  Loss  . rosuvastatin (CRESTOR) 40 MG tablet Take 1 tablet Daily for Cholesterol  . sildenafil (VIAGRA) 100 MG tablet Take 1/2 to 1 tablet Daily as needed for XXXX  . SYRINGE-NEEDLE, DISP, 3 ML (B-D 3CC LUER-LOK SYR 21GX1-1/2) 21G X 1-1/2" 3 ML MISC Test sugar once daily  . testosterone cypionate (DEPOTESTOSTERONE CYPIONATE) 200 MG/ML injection INJECT 2 ML'S INTO THE MUSCLE EVERY 2 WEEKS.  . topiramate (TOPAMAX) 50 MG tablet  Take 1 tablet 2 x / day at Suppertime & Bedtime for Dieting & Weight Loss   No current facility-administered medications on file prior to visit.     Allergies: No Known Allergies   Medical History:  Past Medical History:  Diagnosis Date  . Hyperlipidemia   . Hypertension   . Other testicular hypofunction   . Prediabetes    Family history- Reviewed and unchanged Social history- Reviewed and unchanged   Review of Systems:  Review of Systems  Constitutional: Negative for malaise/fatigue and weight loss.  HENT: Negative for hearing loss and tinnitus.   Eyes: Negative for blurred vision and double vision.  Respiratory: Negative for cough, shortness of breath and wheezing.   Cardiovascular: Negative for chest pain, palpitations, orthopnea, claudication and leg swelling.  Gastrointestinal: Negative for abdominal pain, blood in stool, constipation, diarrhea, heartburn, melena, nausea and vomiting.  Genitourinary: Negative.   Musculoskeletal: Negative for joint pain and myalgias.  Skin: Negative for rash.  Neurological: Negative for dizziness, tingling, sensory change, weakness and headaches.  Endo/Heme/Allergies: Negative for polydipsia.  Psychiatric/Behavioral: Negative.   All other  systems reviewed and are negative.   Physical Exam: BP 124/82   Pulse 92   Temp (!) 97.2 F (36.2 C)   Resp 16   Wt 261 lb (118.4 kg)   BMI 37.45 kg/m  Wt Readings from Last 3 Encounters:  04/08/20 261 lb (118.4 kg)  01/05/20 251 lb (113.9 kg)  03/26/19 273 lb (123.8 kg)   General Appearance: Well nourished, in no apparent distress. Eyes: PERRLA, EOMs, conjunctiva no swelling or erythema Sinuses: No Frontal/maxillary tenderness ENT/Mouth: Ext aud canals clear, TMs without erythema, bulging. No erythema, swelling, or exudate on post pharynx.  Tonsils not swollen or erythematous. Hearing normal.  Neck: Supple, thyroid normal.  Respiratory: Respiratory effort normal, BS equal bilaterally  without rales, rhonchi, wheezing or stridor.  Cardio: RRR with no MRGs. Brisk peripheral pulses without edema.  Abdomen: Soft, + BS.  Non tender, no guarding, rebound, hernias, masses. Lymphatics: Non tender without lymphadenopathy.  Musculoskeletal: Full ROM, 5/5 strength, Normal gait Skin: Warm, dry without rashes, lesions, ecchymosis.  Neuro: Cranial nerves intact. No cerebellar symptoms.  Psych: Awake and oriented X 3, normal affect, Insight and Judgment appropriate.    Izora Ribas, NP 9:29 AM [facility]

## 2020-04-08 ENCOUNTER — Encounter: Payer: Self-pay | Admitting: Adult Health

## 2020-04-08 ENCOUNTER — Ambulatory Visit: Payer: 59 | Admitting: Adult Health

## 2020-04-08 ENCOUNTER — Other Ambulatory Visit: Payer: Self-pay

## 2020-04-08 VITALS — BP 124/82 | HR 92 | Temp 97.2°F | Resp 16 | Wt 261.0 lb

## 2020-04-08 DIAGNOSIS — E559 Vitamin D deficiency, unspecified: Secondary | ICD-10-CM | POA: Diagnosis not present

## 2020-04-08 DIAGNOSIS — E349 Endocrine disorder, unspecified: Secondary | ICD-10-CM

## 2020-04-08 DIAGNOSIS — E782 Mixed hyperlipidemia: Secondary | ICD-10-CM

## 2020-04-08 DIAGNOSIS — Z6836 Body mass index (BMI) 36.0-36.9, adult: Secondary | ICD-10-CM

## 2020-04-08 DIAGNOSIS — Z9989 Dependence on other enabling machines and devices: Secondary | ICD-10-CM

## 2020-04-08 DIAGNOSIS — G4733 Obstructive sleep apnea (adult) (pediatric): Secondary | ICD-10-CM | POA: Diagnosis not present

## 2020-04-08 DIAGNOSIS — Z79899 Other long term (current) drug therapy: Secondary | ICD-10-CM

## 2020-04-08 DIAGNOSIS — E669 Obesity, unspecified: Secondary | ICD-10-CM

## 2020-04-08 DIAGNOSIS — I10 Essential (primary) hypertension: Secondary | ICD-10-CM

## 2020-04-08 DIAGNOSIS — R7989 Other specified abnormal findings of blood chemistry: Secondary | ICD-10-CM

## 2020-04-08 DIAGNOSIS — R7303 Prediabetes: Secondary | ICD-10-CM

## 2020-04-08 MED ORDER — PHENTERMINE HCL 37.5 MG PO TABS: ORAL_TABLET | ORAL | 2 refills | Status: DC

## 2020-04-08 NOTE — Patient Instructions (Addendum)
Goals    . Blood Pressure < 120/80    . DIET - INCREASE WATER INTAKE     80+ fluid ounces daily     . Weight (lb) < 230 lb (104.3 kg)     Weigh weekly and keep a record, aim to lose 0.5-2 lb/week       Reducing sugar, flour, high fructose corn syrup, alcohol   Avoid excess intake of tylenol for liver, excess NSAIDs for kidney Topicals are safe - voltaren, etc Could do daily natural antiinflammatory supplement if lots of aches and pains -   such as tumeric + bioprene/black pepper fruit or tart cherry which are safe for liver/kidney, but do need to be taken consistently   YOU CAN CALL TO MAKE AN ULTRASOUND..  I have put in an order for an ultrasound for you to have You can set them up at your convenience by calling this number [phone] You will likely have the ultrasound at [facility]  If you have any issues call our office and we will set this up for you.       High-Fiber Diet Fiber, also called dietary fiber, is a type of carbohydrate that is found in fruits, vegetables, whole grains, and beans. A high-fiber diet can have many health benefits. Your health care provider may recommend a high-fiber diet to help:  Prevent constipation. Fiber can make your bowel movements more regular.  Lower your cholesterol.  Relieve the following conditions: ? Swelling of veins in the anus (hemorrhoids). ? Swelling and irritation (inflammation) of specific areas of the digestive tract (uncomplicated diverticulosis). ? A problem of the large intestine (colon) that sometimes causes pain and diarrhea (irritable bowel syndrome, IBS).  Prevent overeating as part of a weight-loss plan.  Prevent heart disease, type 2 diabetes, and certain cancers. What is my plan? The recommended daily fiber intake in grams (g) includes:  38 g for men age 7 or younger.  30 g for men over age 71.  96 g for women age 32 or younger.  21 g for women over age 72. You can get the  recommended daily intake of dietary fiber by:  Eating a variety of fruits, vegetables, grains, and beans.  Taking a fiber supplement, if it is not possible to get enough fiber through your diet. What do I need to know about a high-fiber diet?  It is better to get fiber through food sources rather than from fiber supplements. There is not a lot of research about how effective supplements are.  Always check the fiber content on the nutrition facts label of any prepackaged food. Look for foods that contain 5 g of fiber or more per serving.  Talk with a diet and nutrition specialist (dietitian) if you have questions about specific foods that are recommended or not recommended for your medical condition, especially if those foods are not listed below.  Gradually increase how much fiber you consume. If you increase your intake of dietary fiber too quickly, you may have bloating, cramping, or gas.  Drink plenty of water. Water helps you to digest fiber. What are tips for following this plan?  Eat a wide variety of high-fiber foods.  Make sure that half of the grains that you eat each day are whole grains.  Eat breads and cereals that are made with whole-grain flour instead of refined flour or white flour.  Eat brown rice, bulgur wheat, or millet instead of white rice.  Start the day with a breakfast that is high in fiber, such as a cereal that contains 5 g of fiber or more per serving.  Use beans in place of meat in soups, salads, and pasta dishes.  Eat high-fiber snacks, such as berries, raw vegetables, nuts, and popcorn.  Choose whole fruits and vegetables instead of processed forms like juice or sauce. What foods can I eat?  Fruits Berries. Pears. Apples. Oranges. Avocado. Prunes and raisins. Dried figs. Vegetables Sweet potatoes. Spinach. Kale. Artichokes. Cabbage. Broccoli. Cauliflower. Green peas. Carrots. Squash. Grains Whole-grain breads. Multigrain cereal. Oats and oatmeal.  Brown rice. Barley. Bulgur wheat. [place]. Quinoa. Bran muffins. Popcorn. Rye wafer crackers. Meats and other proteins Navy, kidney, and pinto beans. Soybeans. Split peas. Lentils. Nuts and seeds. Dairy Fiber-fortified yogurt. Beverages Fiber-fortified soy milk. Fiber-fortified orange juice. Other foods Fiber bars. The items listed above may not be a complete list of recommended foods and beverages. Contact a dietitian for more options. What foods are not recommended? Fruits Fruit juice. Cooked, strained fruit. Vegetables Fried potatoes. Canned vegetables. Well-cooked vegetables. Grains White bread. Pasta made with refined flour. White rice. Meats and other proteins Fatty cuts of meat. Fried chicken or fried fish. Dairy Milk. Yogurt. Cream cheese. Sour cream. Fats and oils Butters. Beverages Soft drinks. Other foods Cakes and pastries. The items listed above may not be a complete list of foods and beverages to avoid. Contact a dietitian for more information. Summary  Fiber is a type of carbohydrate. It is found in fruits, vegetables, whole grains, and beans.  There are many health benefits of eating a high-fiber diet, such as preventing constipation, lowering blood cholesterol, helping with weight loss, and reducing your risk of heart disease, diabetes, and certain cancers.  Gradually increase your intake of fiber. Increasing too fast can result in cramping, bloating, and gas. Drink plenty of water while you increase your fiber.  The best sources of fiber include whole fruits and vegetables, whole grains, nuts, seeds, and beans. This information is not intended to replace advice given to you by your health care provider. Make sure you discuss any questions you have with your health care provider. Document Revised: 09/30/2017 Document Reviewed: 09/30/2017 Elsevier Patient Education  2020 [employer].

## 2020-04-11 LAB — LIPID PANEL
Cholesterol: 107 mg/dL (ref <200)
HDL: 30 mg/dL — ABNORMAL LOW (ref >=40)
LDL Cholesterol (Calc): 53 mg/dL (calc)
Non-HDL Cholesterol (Calc): 77 mg/dL (calc) (ref <130)
Total CHOL/HDL Ratio: 3.6 (calc) (ref <5.0)
Triglycerides: 160 mg/dL — ABNORMAL HIGH (ref <150)

## 2020-04-11 LAB — COMPLETE METABOLIC PANEL WITH GFR
AG Ratio: 2.1 (calc) (ref 1.0–2.5)
ALT: 39 U/L (ref 9–46)
AST: 35 U/L (ref 10–40)
Albumin: 4.7 g/dL (ref 3.6–5.1)
Alkaline phosphatase (APISO): 48 U/L (ref 36–130)
BUN: 13 mg/dL (ref 7–25)
CO2: 22 mmol/L (ref 20–32)
Calcium: 9.6 mg/dL (ref 8.6–10.3)
Chloride: 107 mmol/L (ref 98–110)
Creat: 1.21 mg/dL (ref 0.60–1.35)
GFR, Est African American: 82 mL/min/{1.73_m2} (ref >=60)
GFR, Est Non African American: 70 mL/min/{1.73_m2} (ref >=60)
Globulin: 2.2 g/dL (calc) (ref 1.9–3.7)
Glucose, Bld: 110 mg/dL — ABNORMAL HIGH (ref 65–99)
Potassium: 4.1 mmol/L (ref 3.5–5.3)
Sodium: 138 mmol/L (ref 135–146)
Total Bilirubin: 0.4 mg/dL (ref 0.2–1.2)
Total Protein: 6.9 g/dL (ref 6.1–8.1)

## 2020-04-11 LAB — CBC WITH DIFFERENTIAL/PLATELET
Absolute Monocytes: 605 cells/uL (ref 200–950)
Basophils Absolute: 32 cells/uL (ref 0–200)
Basophils Relative: 0.5 %
Eosinophils Absolute: 88 cells/uL (ref 15–500)
Eosinophils Relative: 1.4 %
HCT: 49.3 % (ref 38.5–50.0)
Hemoglobin: 16.6 g/dL (ref 13.2–17.1)
Lymphs Abs: 2010 cells/uL (ref 850–3900)
MCH: 26.5 pg — ABNORMAL LOW (ref 27.0–33.0)
MCHC: 33.7 g/dL (ref 32.0–36.0)
MCV: 78.8 fL — ABNORMAL LOW (ref 80.0–100.0)
MPV: 10.3 fL (ref 7.5–12.5)
Monocytes Relative: 9.6 %
Neutro Abs: 3566 cells/uL (ref 1500–7800)
Neutrophils Relative %: 56.6 %
Platelets: 209 10*3/uL (ref 140–400)
RBC: 6.26 10*6/uL — ABNORMAL HIGH (ref 4.20–5.80)
RDW: 15.1 % — ABNORMAL HIGH (ref 11.0–15.0)
Total Lymphocyte: 31.9 %
WBC: 6.3 10*3/uL (ref 3.8–10.8)

## 2020-04-11 LAB — HEPATITIS PANEL, ACUTE
Hep A IgM: NONREACTIVE
Hep B C IgM: NONREACTIVE
Hepatitis B Surface Ag: NONREACTIVE
Hepatitis C Ab: NONREACTIVE
SIGNAL TO CUT-OFF: 0.54 (ref <1.00)

## 2020-04-11 LAB — MAGNESIUM: Magnesium: 2 mg/dL (ref 1.5–2.5)

## 2020-04-11 LAB — TSH: TSH: 0.57 mIU/L (ref 0.40–4.50)

## 2020-04-29 ENCOUNTER — Encounter: Payer: Self-pay | Admitting: Adult Health

## 2020-04-29 ENCOUNTER — Ambulatory Visit
Admission: RE | Admit: 2020-04-29 | Discharge: 2020-04-29 | Disposition: A | Discharge status: Home / Self Care | Payer: 59 | Source: Ambulatory Visit | Attending: Adult Health | Admitting: Adult Health

## 2020-04-29 DIAGNOSIS — R7989 Other specified abnormal findings of blood chemistry: Secondary | ICD-10-CM

## 2020-05-05 ENCOUNTER — Other Ambulatory Visit: Payer: Self-pay | Admitting: Internal Medicine

## 2020-07-13 IMAGING — US US ABDOMEN COMPLETE
1 series · 14 of 25 positions shown · non-contrast
Comparison: None.

CLINICAL DATA: Elevated liver function tests.

EXAM:
ABDOMEN ULTRASOUND COMPLETE

[Series 1: us abdomen complete · 0.23mm/px · 14 of 92 slices shown]
[im 1/92]
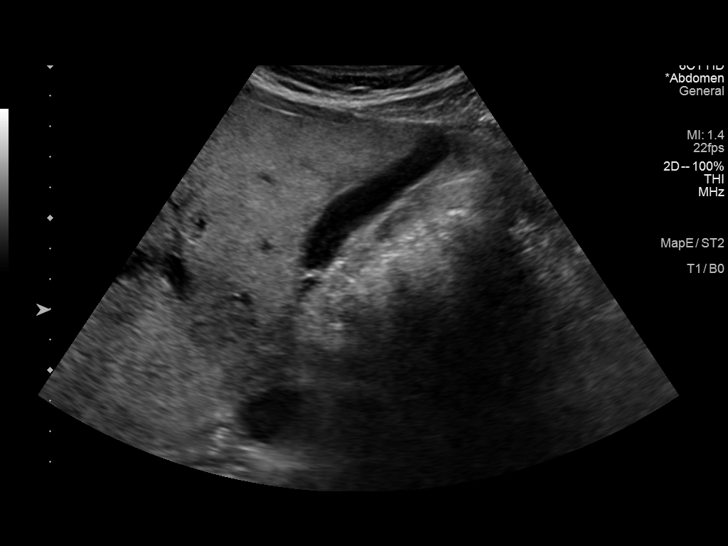
[im 8/92]
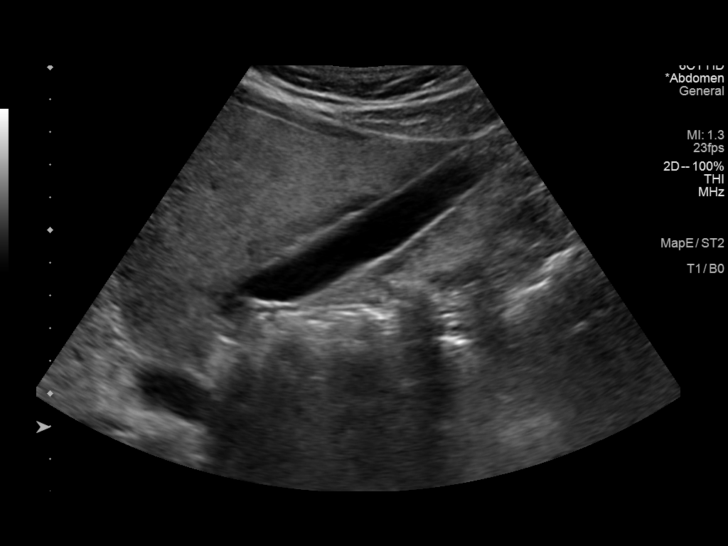
[im 16/92]
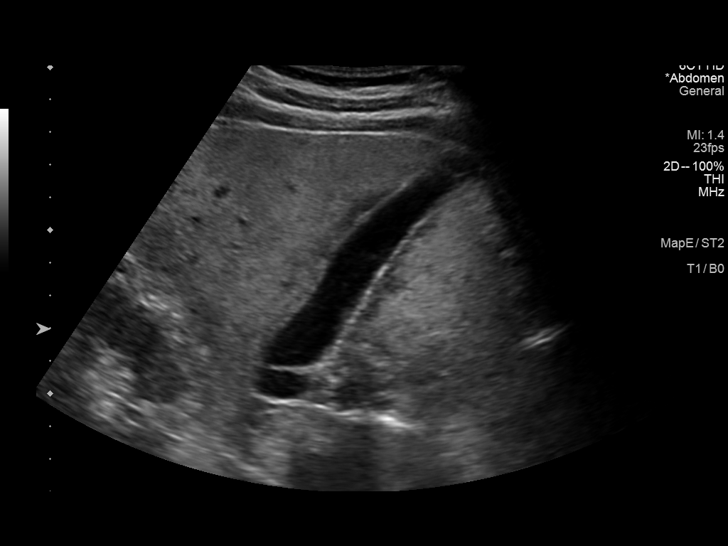
[im 23/92]
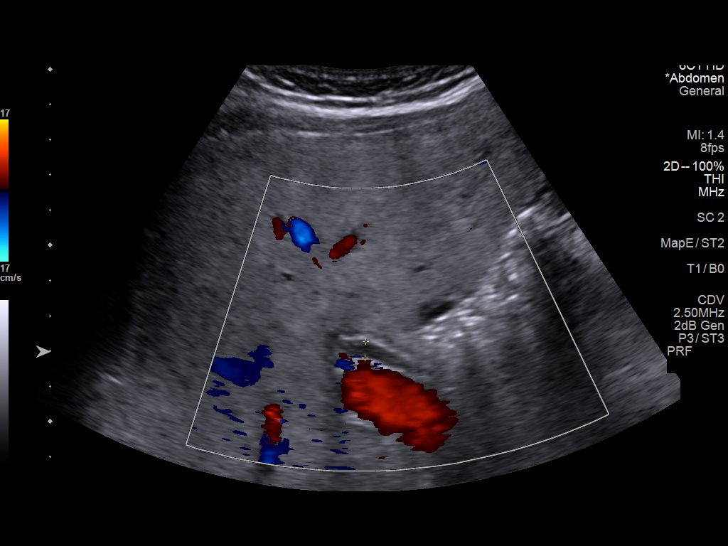
[im 31/92]
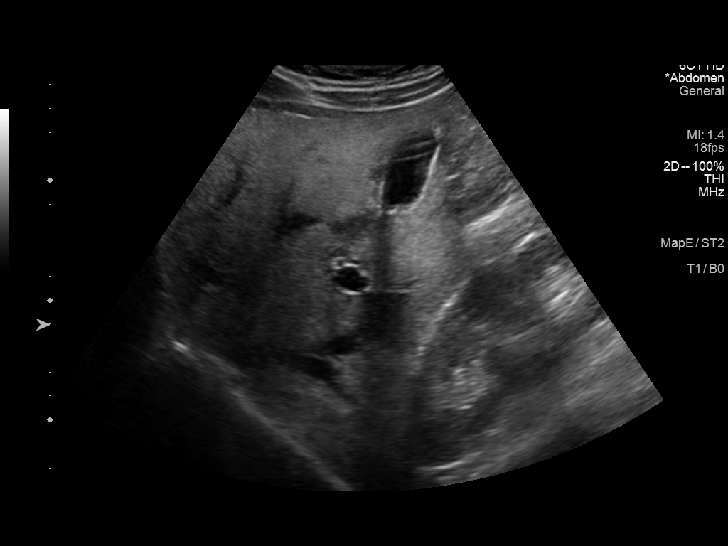
[im 35/92]
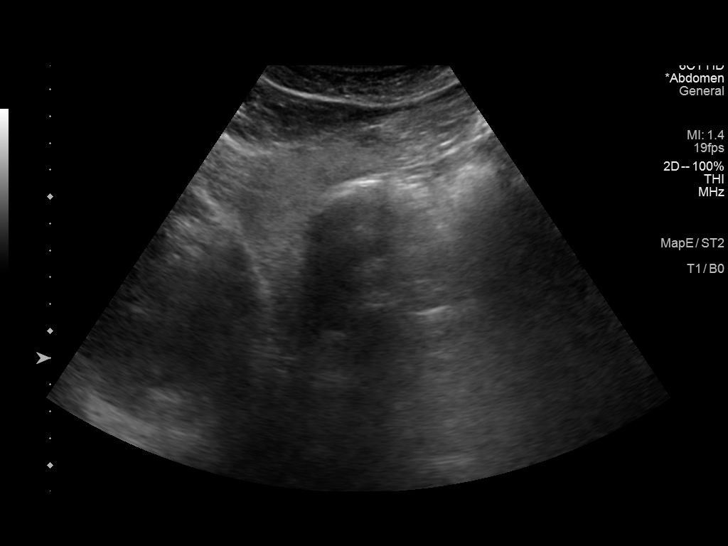
[im 42/92]
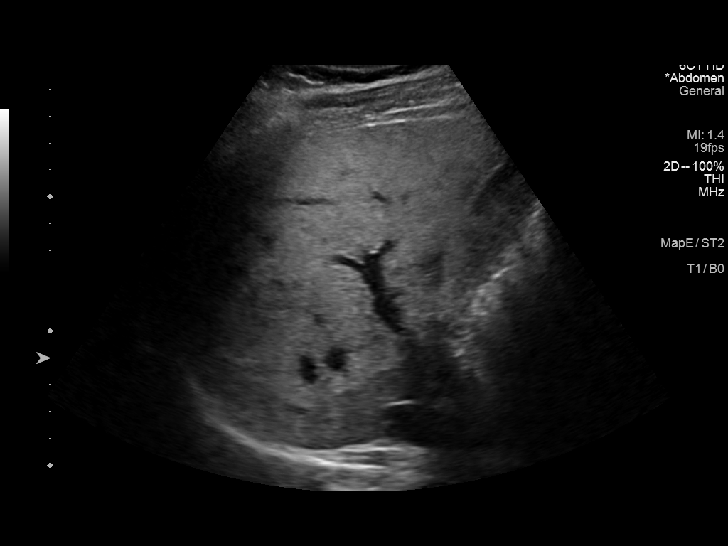
[im 50/92]
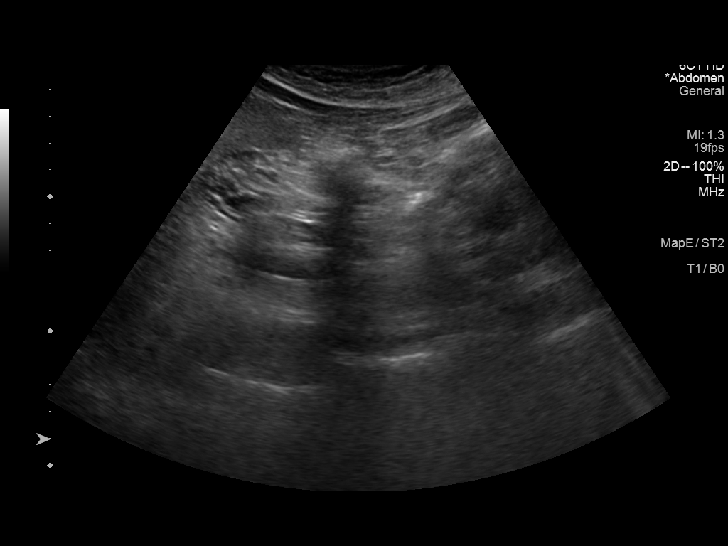
[im 57/92]
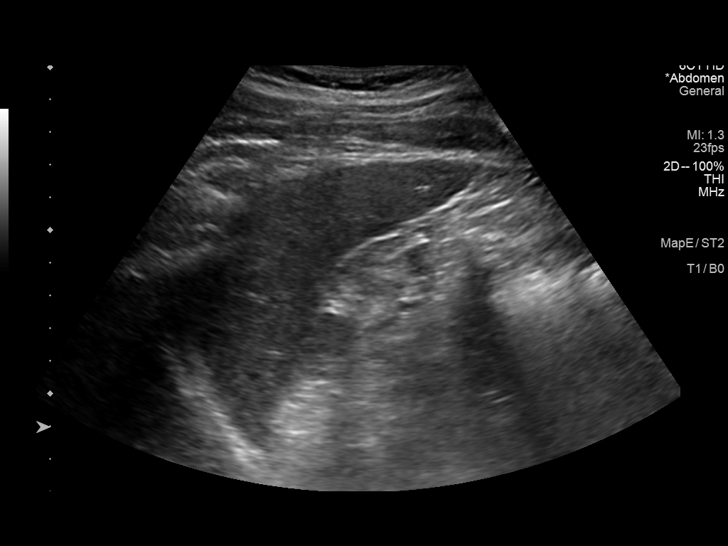
[im 61/92]
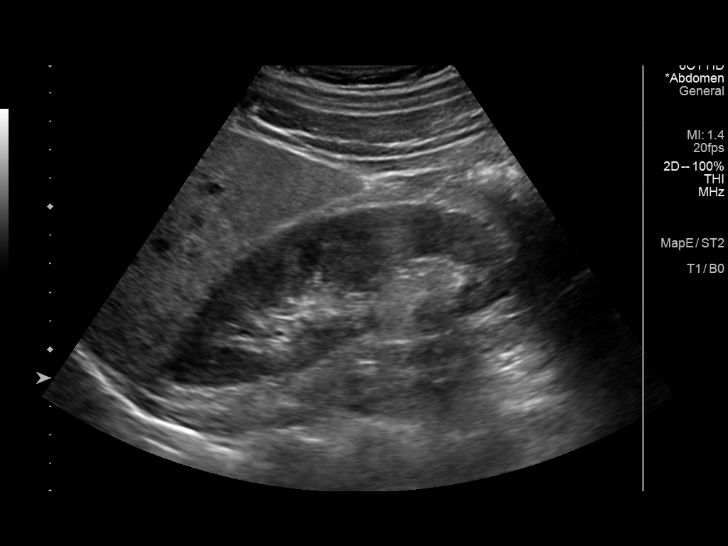
[im 69/92]
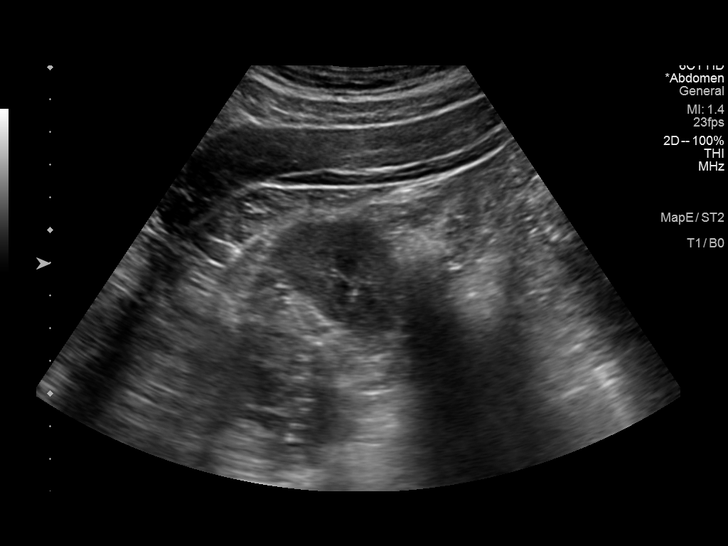
[im 76/92]
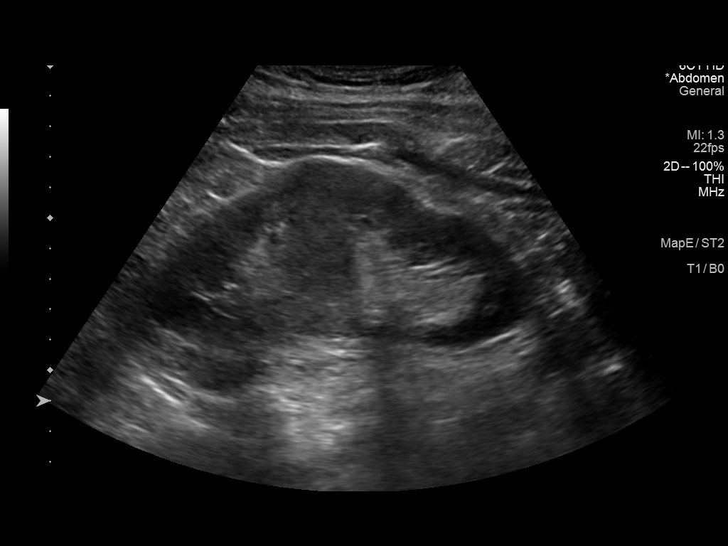
[im 84/92]
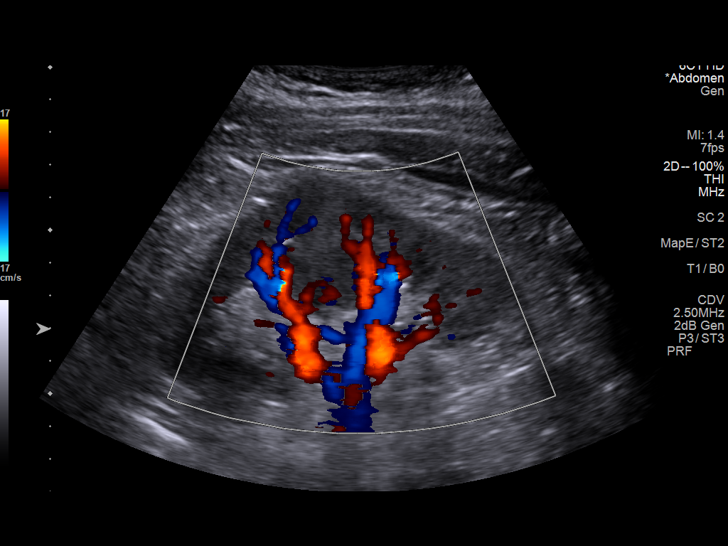
[im 92/92]
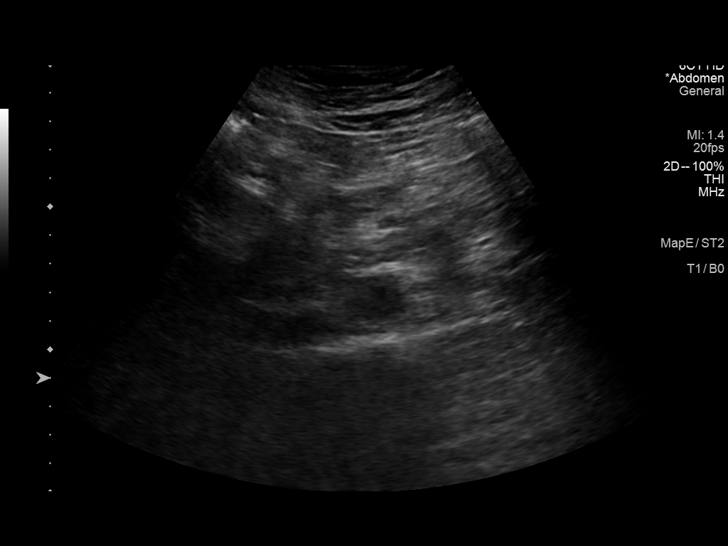

[14 of 25 positions shown; findings below may reference images not displayed]

FINDINGS: Gallbladder: No gallstones or wall thickening visualized. No
sonographic Murphy sign noted by sonographer.

Common bile duct: Diameter: 5 mm

Liver: Background moderately increased parenchymal echogenicity
compatible with steatosis. Patchy hypoechoic areas including near
the gallbladder compatible with fatty sparing portal vein is patent
on color Doppler imaging with normal direction of blood flow towards
the liver.

IVC: Suboptimally visualized.  No abnormality visualized.

Pancreas: Visualized portion unremarkable.

Spleen: Size and appearance within normal limits.

Right Kidney: Length: 13.7 cm. Echogenicity within normal limits. No
mass or hydronephrosis visualized.

Left Kidney: Length: 13.0 cm. Echogenicity within normal limits. No
mass or hydronephrosis visualized. Prominent column of Bertin.

Abdominal aorta: No aneurysm visualized.

Other findings: None.
IMPRESSION: 1. Hepatic steatosis.
2. Otherwise negative abdominal ultrasound.

## 2020-07-15 ENCOUNTER — Ambulatory Visit: Payer: 59 | Admitting: Internal Medicine

## 2020-09-13 ENCOUNTER — Other Ambulatory Visit: Payer: Self-pay | Admitting: Internal Medicine

## 2020-09-13 DIAGNOSIS — Z6836 Body mass index (BMI) 36.0-36.9, adult: Secondary | ICD-10-CM

## 2020-09-14 ENCOUNTER — Ambulatory Visit (INDEPENDENT_AMBULATORY_CARE_PROVIDER_SITE_OTHER): Payer: 59 | Admitting: Internal Medicine

## 2020-09-14 ENCOUNTER — Other Ambulatory Visit: Payer: Self-pay

## 2020-09-14 ENCOUNTER — Encounter: Payer: Self-pay | Admitting: Internal Medicine

## 2020-09-14 ENCOUNTER — Telehealth: Payer: Self-pay | Admitting: *Deleted

## 2020-09-14 VITALS — BP 134/82 | HR 106 | Temp 97.2°F | Resp 16 | Ht 70.0 in | Wt 257.8 lb

## 2020-09-14 DIAGNOSIS — J0410 Acute tracheitis without obstruction: Secondary | ICD-10-CM

## 2020-09-14 DIAGNOSIS — J320 Chronic maxillary sinusitis: Secondary | ICD-10-CM | POA: Diagnosis not present

## 2020-09-14 DIAGNOSIS — Z136 Encounter for screening for cardiovascular disorders: Secondary | ICD-10-CM | POA: Diagnosis not present

## 2020-09-14 DIAGNOSIS — R079 Chest pain, unspecified: Secondary | ICD-10-CM | POA: Diagnosis not present

## 2020-09-14 MED ORDER — BENZONATATE 200 MG PO CAPS: ORAL_CAPSULE | ORAL | 1 refills | Status: DC

## 2020-09-14 MED ORDER — AZITHROMYCIN 250 MG PO TABS: ORAL_TABLET | ORAL | 1 refills | Status: DC

## 2020-09-14 MED ORDER — DEXAMETHASONE 4 MG PO TABS: ORAL_TABLET | ORAL | 0 refills | Status: DC

## 2020-09-14 MED ORDER — PROMETHAZINE-CODEINE 6.25-10 MG/5ML PO SYRP: ORAL_SOLUTION | ORAL | 1 refills | Status: DC

## 2020-09-14 NOTE — Progress Notes (Signed)
History of Present Illness:     Patient is a very nice 49 yo MBM w/HTN,HLD, Pre-DM who presents with a 24-36 hr prodrome of sinus & nasal congestion & tracheal cough. Reports nasal secretions / sputum clear to yellowish. Denies fever, chill, sweats or dyspnea. Has had tenderness of the lower anterior chest & is concerned of possible heart problem. Denies CP with activity or exertion.   Medications  Current Outpatient Medications (Endocrine & Metabolic):  .  testosterone cypionate (DEPOTESTOSTERONE CYPIONATE) 200 MG/ML injection, INJECT 2 MLS INTO THE MUSCLE EVERY 2 WEEKS .  dexamethasone (DECADRON) 4 MG tablet, Take 3 tabs NOW,    then take  1 tab 3 x day - 2 days,      then 2 x day - 3 days,      then 1 tab daily  Current Outpatient Medications (Cardiovascular):  .  losartan-hydrochlorothiazide (HYZAAR) 50-12.5 MG tablet, Take   1 tablet   Daily       for BP .  rosuvastatin (CRESTOR) 40 MG tablet, Take 1 tablet Daily for Cholesterol .  sildenafil (VIAGRA) 100 MG tablet, Take 1/2 to 1 tablet Daily as needed for XXXX  Current Outpatient Medications (Respiratory):  .  ipratropium (ATROVENT) 0.06 % nasal spray, Use 1 to 2 sprays each nostril 2 to 3 x /day as needed for watery nasal drainage .  benzonatate (TESSALON) 200 MG capsule, Take 1 perle 3 x / day to prevent cough .  promethazine-codeine (PHENERGAN WITH CODEINE) 6.25-10 MG/5ML syrup, Take 1 or 2 teaspoonful every 4 hours as needed for Cough / Congestion  Current Outpatient Medications (Analgesics):  .  aspirin EC 81 MG tablet, Take 81 mg by mouth daily.   Current Outpatient Medications (Other):  Marland Kitchen  Cholecalciferol (VITAMIN D PO), Take 2,000 Int'l Units by mouth daily. .  cyclobenzaprine (FLEXERIL) 10 MG tablet, Take 1/2 to 1 tablet 3 x /day as needed for Muscle Spasm .  minocycline (MINOCIN) 50 MG capsule, Take 50 mg by mouth 2 (two) times daily. .  Multiple Vitamin (MULTIVITAMIN) tablet, Take 1 tablet by mouth daily. .   phentermine (ADIPEX-P) 37.5 MG tablet, Take 1/2 to 1 tablet every morning for Dieting & Weight  Loss .  SYRINGE-NEEDLE, DISP, 3 ML (B-D 3CC LUER-LOK SYR 21GX1-1/2) 21G X 1-1/2" 3 ML MISC, Test sugar once daily .  topiramate (TOPAMAX) 50 MG tablet, Take     1 tablet     2 x /day      at Supper & Bedtime      for Dieting & Weight Loss .  azithromycin (ZITHROMAX) 250 MG tablet, Take 2 tablets with Food on  Day 1, then 1 tablet Daily with Food for Infection  Problem list He has Hypertension; Prediabetes; Testosterone deficiency; Hyperlipidemia, mixed; Medication management; Vitamin D deficiency; OSA on CPAP; IBS; Obesity (BMI 30.0-34.9); and Hepatic steatosis on their problem list.   Observations/Objective:   BP 134/82   Pulse (!) 106   Temp (!) 97.2 F (36.2 C)   Resp 16   Ht 5\' 10"  (1.778 m)   Wt 257 lb 12.8 oz (116.9 kg)   SpO2 96%   BMI 36.99 kg/m   (+) dry tracheal cough.  HEENT - EAC's & TM's NL. (+) tender Rt Maxillary sinus area.  N/O/P - clear. Neck - supple.  Chest - Clear equal BS ww'o rales , rhonchi or wheezes.  Cor - Nl HS. RRR w/o sig MGR. PP 1(+). No edema. MS- FROM  w/o deformities.  Gait Nl. Neuro -  Nl w/o focal abnormalities. Assessment and Plan:  1. Left maxillary sinusitis  - dexamethasone (DECADRON) 4 MG tablet; Take 3 tabs NOW,    then take  1 tab 3 x day - 2 days,      then 2 x day - 3 days,      then 1 tab daily  Dispense: 20 tablet  - azithromycin (ZITHROMAX) 250 MG tablet; Take 2 tablets with Food on  Day 1, then 1 tablet Daily with Food for Infection  Dispense: 6 each; Refill: 1  2. Tracheitis  - dexamethasone (DECADRON) 4 MG tablet; Take 3 tabs NOW,    then take  1 tab 3 x day - 2 days,      then 2 x day - 3 days,      then 1 tab daily  Dispense: 20 tablet;   - benzonatate (TESSALON) 200 MG capsule; Take 1 perle 3 x / day to prevent cough  Dispense: 30 capsule; Refill: 1  - azithromycin (ZITHROMAX) 250 MG tablet; Take 2 tablets with Food on  Day 1,  then 1 tablet Daily with Food for Infection  Dispense: 6 each; Refill: 1  - promethazine-codeine (PHENERGAN WITH CODEINE) 6.25-10 MG/5ML syrup; Take 1 or 2 teaspoonful every 4 hours as needed for Cough / Congestion  Dispense: 360 mL; Refill: 1  3. Chest pain  - EKG 12-Lead -  WNL   Follow Up Instructions:       I discussed the assessment and treatment plan with the patient. The patient was provided an opportunity to ask questions and all were answered. The patient agreed with the plan and demonstrated an understanding of the instructions.           The patient was advised to call back or seek an in-person evaluation if the symptoms worsen or if the condition fails to improve as anticipated.    Kirtland Bouchard, MD

## 2020-09-14 NOTE — Telephone Encounter (Signed)
Returned call to patient. States he had some chest pain and shortness of breath on C-pap last night. Also complained of nasal drainage. Per Dr Melford Aase, the patient was scheduled for an OV to evaluate.

## 2020-09-15 ENCOUNTER — Encounter (HOSPITAL_COMMUNITY): Payer: Self-pay

## 2020-09-15 ENCOUNTER — Ambulatory Visit (HOSPITAL_COMMUNITY)
Admission: EM | Admit: 2020-09-15 | Discharge: 2020-09-15 | Disposition: A | Discharge status: Home / Self Care | Payer: 59 | Attending: Family Medicine | Admitting: Family Medicine

## 2020-09-15 ENCOUNTER — Other Ambulatory Visit: Payer: Self-pay

## 2020-09-15 DIAGNOSIS — J069 Acute upper respiratory infection, unspecified: Secondary | ICD-10-CM | POA: Diagnosis present

## 2020-09-15 DIAGNOSIS — R0782 Intercostal pain: Secondary | ICD-10-CM | POA: Diagnosis not present

## 2020-09-15 NOTE — Discharge Instructions (Addendum)
Thank you for coming in to see [place] today! Please see below to review our plan for today's visit:  1.  Your EKG today was normal making the chance of a heart attack or cardiac cause of your chest pain for less likely. 2.  Continue to monitor for symptoms and continue to take your medications as prescribed. 3.  Follow-up with your primary care physician as needed.  It was our pleasure to serve you!   Dr. Milus Banister [facility]

## 2020-09-15 NOTE — ED Triage Notes (Signed)
Pt reports he started having pain under the breast and ribcage and shortness of breath an exertion 2 days ago. States he went to his PCP yesterday and had a EKG and heart rate was high. Pt reports his PCP told him to come to the UC and had an xray done. Pt states he is having difficulty breathing on the C-PAP for the past 2 days. States he is having non stop hiccups since this morning.  Denies fever, chills, diarrhea.

## 2020-09-15 NOTE — ED Provider Notes (Signed)
[facility]    CSN: [id number] Arrival date & time: 09/15/20  1512     History   Chief Complaint Chief Complaint  Patient presents with  . Chest Pain  . Shortness of Breath  . Cough  . Hiccups    HPI Mike Cohen is a 49 y.o. male p/w history of pain under the breast/ribcage and SOB with exertion that started 2 days ago. He went to his PCP's office yesterday 10/8 and had an EKG done which was normal. The patient returns to urgent care today with continued chest pain, but also describes now having hiccups, also is having some sinus congestion with runny nose (patient vaccinated against COVID). He does have a family history of heart disease with his dad and his uncle. He had another uncle that had a stroke.  HPI  Past Medical History:  Diagnosis Date  . Hyperlipidemia   . Hypertension   . Other testicular hypofunction   . Prediabetes     Patient Active Problem List   Diagnosis Date Noted  . Viral upper respiratory tract infection 09/15/2020  . Intercostal pain 09/15/2020  . Hepatic steatosis 03/27/2019  . Obesity (BMI 30.0-34.9) 09/19/2015  . Medication management 04/02/2014  . Vitamin D deficiency 04/02/2014  . OSA on CPAP 04/02/2014  . IBS 04/02/2014  . Hypertension   . Prediabetes   . Testosterone deficiency   . Hyperlipidemia, mixed     Past Surgical History:  Procedure Laterality Date  . ROTATOR CUFF REPAIR Right 2007  . SHOULDER ARTHROSCOPY Right 2000       Home Medications    Prior to Admission medications   Medication Sig Start Date End Date Taking? Authorizing Provider  aspirin EC 81 MG tablet Take 81 mg by mouth daily.    [provider]  azithromycin (ZITHROMAX) 250 MG tablet Take 2 tablets with Food on  Day 1, then 1 tablet Daily with Food for Infection 09/14/20   Unk Pinto, MD  benzonatate (TESSALON) 200 MG capsule Take 1 perle 3 x / day to prevent cough 09/14/20   Unk Pinto, MD  Cholecalciferol (VITAMIN D  PO) Take 2,000 Int'l Units by mouth daily.    [provider]  cyclobenzaprine (FLEXERIL) 10 MG tablet Take 1/2 to 1 tablet 3 x /day as needed for Muscle Spasm 01/19/19   Unk Pinto, MD  dexamethasone (DECADRON) 4 MG tablet Take 3 tabs NOW,    then take  1 tab 3 x day - 2 days,      then 2 x day - 3 days,      then 1 tab daily 09/14/20   Unk Pinto, MD  ipratropium (ATROVENT) 0.06 % nasal spray Use 1 to 2 sprays each nostril 2 to 3 x /day as needed for watery nasal drainage 01/05/20   Unk Pinto, MD  losartan-hydrochlorothiazide [facility]) 50-12.5 MG tablet Take   1 tablet   Daily       for BP 09/13/20   Unk Pinto, MD  minocycline (MINOCIN) 50 MG capsule Take 50 mg by mouth 2 (two) times daily. 12/12/19   [provider]  Multiple Vitamin (MULTIVITAMIN) tablet Take 1 tablet by mouth daily.    [provider]  phentermine (ADIPEX-P) 37.5 MG tablet Take 1/2 to 1 tablet every morning for Dieting & Weight  Loss 04/08/20   Liane Comber, NP  promethazine-codeine (PHENERGAN WITH CODEINE) 6.25-10 MG/5ML syrup Take 1 or 2 teaspoonful every 4 hours as needed for Cough / Congestion  09/14/20   Unk Pinto, MD  rosuvastatin (CRESTOR) 40 MG tablet Take 1 tablet Daily for Cholesterol 03/23/20   Unk Pinto, MD  sildenafil (VIAGRA) 100 MG tablet Take 1/2 to 1 tablet Daily as needed for XXXX 03/22/20   Unk Pinto, MD  SYRINGE-NEEDLE, [place], 3 ML (B-D 3CC LUER-LOK SYR 21GX1-1/2) 21G X 1-1/2" 3 ML MISC Test sugar once daily 06/14/15   Vicie Mutters, PA-C  testosterone cypionate (DEPOTESTOSTERONE CYPIONATE) 200 MG/ML injection INJECT 2 MLS INTO THE MUSCLE EVERY 2 WEEKS 05/05/20   Unk Pinto, MD  topiramate (TOPAMAX) 50 MG tablet Take     1 tablet     2 x /day      at Supper & Bedtime      for Dieting & Weight Loss 09/13/20   Unk Pinto, MD    Family History Family History  Problem Relation Age of Onset  . Heart disease Father   . Hyperlipidemia  Father   . Hypertension Father     Social History Social History   Tobacco Use  . Smoking status: Never Smoker  . Smokeless tobacco: Never Used  . Tobacco comment: occasional cigar  Substance Use Topics  . Alcohol use: Yes    Alcohol/week: 0.0 standard drinks    Comment: occasional  . Drug use: No     Allergies   Patient has no known allergies.   Review of Systems Review of Systems -see HPI   Physical Exam Triage Vital Signs ED Triage Vitals  Enc Vitals Group     BP 09/15/20 1606 121/87     Pulse Rate 09/15/20 1606 (!) 109     Resp 09/15/20 1606 20     Temp 09/15/20 1606 98.3 F (36.8 C)     Temp Source 09/15/20 1606 Oral     SpO2 09/15/20 1606 99 %     Weight --      Height --      Head Circumference --      Peak Flow --      Pain Score 09/15/20 1604 0     Pain Loc --      Pain Edu? --      Excl. in [place]? --    No data found.  Updated Vital Signs BP 121/87 (BP Location: Right Arm)   Pulse (!) 109   Temp 98.3 F (36.8 C) (Oral)   Resp 20   SpO2 99%   Physical exam: General: Well-appearing patient, nontoxic-appearing HEENT: Normocephalic, atraumatic, PERRLA, EOMI, patent nares with edematous, erythematous turbinates, no active drainage appreciated; no pharyngeal erythema or tonsillar exudates appreciated Respiratory: CTA bilaterally, comfortable work of breathing Cardio: RRR, S1-S2 present, no murmurs appreciated; occasionally patient's heart rate goes above 100. Tenderness appreciated to palpation along ribs, diaphragm. Abdomen: Soft, nontender, normal bowel sounds, no masses   UC Treatments / Results  Labs (all labs ordered are listed, but only abnormal results are displayed) Labs Reviewed - No data to display  EKG   Radiology No results found.  Procedures Procedures (including critical care time)  Medications Ordered in UC Medications - No data to display  Initial Impression / Assessment and Plan / UC Course  I have reviewed the  triage vital signs and the nursing notes.  Pertinent labs & imaging results that were available during my care of the patient were reviewed by me and considered in my medical decision making (see chart for details).  Chest pain: Patient EKG normal, chest pain we feel is most likely  due to acute URI. Chest pain reproducible on physical exam to palpation. Hiccups resolved prior to discharge. -Patient reassured EKG was normal -Encouraged to continue to monitor for symptoms -Continue meds as prescribed -Follow-up with primary care physician for chest pain -Strict return precautions provided  Final Clinical Impressions(s) / UC Diagnoses   Final diagnoses:  Intercostal pain  Viral upper respiratory tract infection     Discharge Instructions     Thank you for coming in to see [place] today! Please see below to review our plan for today's visit:  1.  Your EKG today was normal making the chance of a heart attack or cardiac cause of your chest pain for less likely. 2.  Continue to monitor for symptoms and continue to take your medications as prescribed. 3.  Follow-up with your primary care physician as needed.  It was our pleasure to serve you!   Dr. Milus Banister [facility]     ED Prescriptions    None     PDMP not reviewed this encounter.   Daisy Floro, DO 09/19/20 2044

## 2020-09-28 ENCOUNTER — Other Ambulatory Visit: Payer: Self-pay | Admitting: Internal Medicine

## 2020-09-28 DIAGNOSIS — J0410 Acute tracheitis without obstruction: Secondary | ICD-10-CM

## 2020-09-28 DIAGNOSIS — J320 Chronic maxillary sinusitis: Secondary | ICD-10-CM

## 2020-09-29 ENCOUNTER — Other Ambulatory Visit: Payer: Self-pay

## 2020-09-29 ENCOUNTER — Ambulatory Visit (INDEPENDENT_AMBULATORY_CARE_PROVIDER_SITE_OTHER): Payer: 59 | Admitting: Otolaryngology

## 2020-09-29 DIAGNOSIS — J310 Chronic rhinitis: Secondary | ICD-10-CM | POA: Diagnosis not present

## 2020-09-29 DIAGNOSIS — G4733 Obstructive sleep apnea (adult) (pediatric): Secondary | ICD-10-CM | POA: Diagnosis not present

## 2020-09-29 NOTE — Progress Notes (Signed)
HPI: Mike Cohen is a 49 y.o. male who returns today for evaluation of compliance with use of his CPAP machine for obstructive sleep apnea.  He started using the machine on September 20th..  On his compliance records he was noncompliant on 1 day and he stated the reason for this was upper respiratory infection.  So his compliance is over 95%.  He does complain of some mild nasal congestion.  When he uses the machine his AHI is minimal (0.1-0.3).  Median pressure is 10.9 cm H2O..  Past Medical History:  Diagnosis Date  . Hyperlipidemia   . Hypertension   . Other testicular hypofunction   . Prediabetes    Past Surgical History:  Procedure Laterality Date  . ROTATOR CUFF REPAIR Right 2007  . SHOULDER ARTHROSCOPY Right 2000   Social History   Socioeconomic History  . Marital status: Married    Spouse name: Not on file  . Number of children: Not on file  . Years of education: Not on file  . Highest education level: Not on file  Occupational History  . Not on file  Tobacco Use  . Smoking status: Never Smoker  . Smokeless tobacco: Never Used  . Tobacco comment: occasional cigar  Substance and Sexual Activity  . Alcohol use: Yes    Alcohol/week: 0.0 standard drinks    Comment: occasional  . Drug use: No  . Sexual activity: Not on file  Other Topics Concern  . Not on file  Social History Narrative  . Not on file   Social Determinants of Health   Financial Resource Strain:   . Difficulty of Paying Living Expenses: Not on file  Food Insecurity:   . Worried About Charity fundraiser in the Last Year: Not on file  . Ran Out of Food in the Last Year: Not on file  Transportation Needs:   . Lack of Transportation (Medical): Not on file  . Lack of Transportation (Non-Medical): Not on file  Physical Activity:   . Days of Exercise per Week: Not on file  . Minutes of Exercise per Session: Not on file  Stress:   . Feeling of Stress : Not on file  Social Connections:   .  Frequency of Communication with Friends and Family: Not on file  . Frequency of Social Gatherings with Friends and Family: Not on file  . Attends Religious Services: Not on file  . Active Member of Clubs or Organizations: Not on file  . Attends Archivist Meetings: Not on file  . Marital Status: Not on file   Family History  Problem Relation Age of Onset  . Heart disease Father   . Hyperlipidemia Father   . Hypertension Father    No Known Allergies Prior to Admission medications   Medication Sig Start Date End Date Taking? Authorizing Provider  aspirin EC 81 MG tablet Take 81 mg by mouth daily.    [provider]  azithromycin (ZITHROMAX) 250 MG tablet Take       2 tablets     today with Food, then 1 tablet Daily with food forr Infection 09/28/20   Unk Pinto, MD  benzonatate (TESSALON) 200 MG capsule Take 1 perle 3 x / day to prevent cough 09/14/20   Unk Pinto, MD  Cholecalciferol (VITAMIN D PO) Take 2,000 Int'l Units by mouth daily.    [provider]  cyclobenzaprine (FLEXERIL) 10 MG tablet Take 1/2 to 1 tablet 3 x /day as needed for Muscle Spasm  01/19/19   Unk Pinto, MD  dexamethasone (DECADRON) 4 MG tablet Take 3 tabs NOW,    then take  1 tab 3 x day - 2 days,      then 2 x day - 3 days,      then 1 tab daily 09/14/20   Unk Pinto, MD  ipratropium (ATROVENT) 0.06 % nasal spray Use 1 to 2 sprays each nostril 2 to 3 x /day as needed for watery nasal drainage 01/05/20   Unk Pinto, MD  losartan-hydrochlorothiazide [facility]) 50-12.5 MG tablet Take   1 tablet   Daily       for BP 09/13/20   Unk Pinto, MD  minocycline (MINOCIN) 50 MG capsule Take 50 mg by mouth 2 (two) times daily. 12/12/19   [provider]  Multiple Vitamin (MULTIVITAMIN) tablet Take 1 tablet by mouth daily.    [provider]  phentermine (ADIPEX-P) 37.5 MG tablet Take 1/2 to 1 tablet every morning for Dieting & Weight  Loss 04/08/20   Liane Comber, NP  promethazine-codeine (PHENERGAN WITH CODEINE) 6.25-10 MG/5ML syrup Take 1 or 2 teaspoonful every 4 hours as needed for Cough / Congestion 09/14/20   Unk Pinto, MD  rosuvastatin (CRESTOR) 40 MG tablet Take 1 tablet Daily for Cholesterol 03/23/20   Unk Pinto, MD  sildenafil (VIAGRA) 100 MG tablet Take 1/2 to 1 tablet Daily as needed for XXXX 03/22/20   Unk Pinto, MD  SYRINGE-NEEDLE, DISP, 3 ML (B-D 3CC LUER-LOK SYR 21GX1-1/2) 21G X 1-1/2" 3 ML MISC Test sugar once daily 06/14/15   Vladimir Crofts, PA-C  testosterone cypionate (DEPOTESTOSTERONE CYPIONATE) 200 MG/ML injection INJECT 2 MLS INTO THE MUSCLE EVERY 2 WEEKS 05/05/20   Unk Pinto, MD  topiramate (TOPAMAX) 50 MG tablet Take     1 tablet     2 x /day      at [place]      for Dieting & Weight Loss 09/13/20   Unk Pinto, MD     Positive ROS: Otherwise negative  All other systems have been reviewed and were otherwise negative with the exception of those mentioned in the HPI and as above.  Physical Exam: Constitutional: Alert, well-appearing, no acute distress Ears: External ears without lesions or tenderness. Ear canals are clear bilaterally with intact, clear TMs.  Nasal: External nose without lesions. Septum minimal deformity with mild rhinitis and mild turbinate'.  No polyps.  No signs of infection.. Clear nasal passages Oral: Lips and gums without lesions. Tongue and palate mucosa without lesions. Posterior oropharynx clear. Neck: No palpable adenopathy or masses Respiratory: Breathing comfortably  Skin: No facial/neck lesions or rash noted.  Procedures  Assessment: Chronic rhinitis. Obstructive sleep apnea.  Plan: Patient is over 95% compliant with use of CPAP machine with good results with AHI less than 0.3. I prescribed use of Nasacort 2 sprays each nostril at night for his nasal congestion.  He already uses Atrovent for nasal drainage which occurs when he uses his mask all the  time.   Radene Journey, MD

## 2020-10-10 ENCOUNTER — Ambulatory Visit: Payer: 59 | Admitting: Adult Health

## 2020-11-16 ENCOUNTER — Other Ambulatory Visit: Payer: Self-pay | Admitting: Internal Medicine

## 2020-11-25 ENCOUNTER — Ambulatory Visit: Payer: 59 | Attending: Internal Medicine

## 2020-11-25 DIAGNOSIS — Z23 Encounter for immunization: Secondary | ICD-10-CM

## 2020-11-25 NOTE — Progress Notes (Signed)
° °  Covid-19 Vaccination Clinic  Name:  Mike Cohen    MRN: [id number] DOB: [birth year]  11/25/2020  [name] was observed post Covid-19 immunization for 15 minutes without incident. He was provided with Vaccine Information Sheet and instruction to access the V-Safe system.   [name] was instructed to call 911 with any severe reactions post vaccine:  Difficulty breathing   Swelling of face and throat   A fast heartbeat   A bad rash all over body   Dizziness and weakness   Immunizations Administered    Name Date Dose VIS Date Route   Pfizer COVID-19 Vaccine 11/25/2020  4:59 PM 0.3 mL 09/28/2020 Intramuscular   Manufacturer: [place]   Lot: [id number]   [place]: [id number]

## 2020-12-29 ENCOUNTER — Other Ambulatory Visit: Payer: Self-pay | Admitting: *Deleted

## 2020-12-29 DIAGNOSIS — E782 Mixed hyperlipidemia: Secondary | ICD-10-CM

## 2020-12-29 MED ORDER — ROSUVASTATIN CALCIUM 40 MG PO TABS: ORAL_TABLET | ORAL | 1 refills | Status: DC

## 2021-01-19 ENCOUNTER — Ambulatory Visit (INDEPENDENT_AMBULATORY_CARE_PROVIDER_SITE_OTHER): Payer: 59 | Admitting: Internal Medicine

## 2021-01-19 ENCOUNTER — Encounter: Payer: Self-pay | Admitting: Internal Medicine

## 2021-01-19 ENCOUNTER — Other Ambulatory Visit: Payer: Self-pay

## 2021-01-19 VITALS — BP 124/88 | HR 88 | Temp 97.4°F | Resp 16 | Ht 70.0 in | Wt 265.2 lb

## 2021-01-19 DIAGNOSIS — N521 Erectile dysfunction due to diseases classified elsewhere: Secondary | ICD-10-CM

## 2021-01-19 DIAGNOSIS — R7309 Other abnormal glucose: Secondary | ICD-10-CM

## 2021-01-19 DIAGNOSIS — Z111 Encounter for screening for respiratory tuberculosis: Secondary | ICD-10-CM | POA: Diagnosis not present

## 2021-01-19 DIAGNOSIS — I10 Essential (primary) hypertension: Secondary | ICD-10-CM

## 2021-01-19 DIAGNOSIS — Z0001 Encounter for general adult medical examination with abnormal findings: Secondary | ICD-10-CM

## 2021-01-19 DIAGNOSIS — L730 Acne keloid: Secondary | ICD-10-CM

## 2021-01-19 DIAGNOSIS — R7303 Prediabetes: Secondary | ICD-10-CM

## 2021-01-19 DIAGNOSIS — Z0000 Encounter for general adult medical examination without abnormal findings: Secondary | ICD-10-CM

## 2021-01-19 DIAGNOSIS — Z1211 Encounter for screening for malignant neoplasm of colon: Secondary | ICD-10-CM

## 2021-01-19 DIAGNOSIS — Z8249 Family history of ischemic heart disease and other diseases of the circulatory system: Secondary | ICD-10-CM

## 2021-01-19 DIAGNOSIS — E559 Vitamin D deficiency, unspecified: Secondary | ICD-10-CM

## 2021-01-19 DIAGNOSIS — R5383 Other fatigue: Secondary | ICD-10-CM

## 2021-01-19 DIAGNOSIS — Z79899 Other long term (current) drug therapy: Secondary | ICD-10-CM

## 2021-01-19 DIAGNOSIS — Z125 Encounter for screening for malignant neoplasm of prostate: Secondary | ICD-10-CM

## 2021-01-19 DIAGNOSIS — G4733 Obstructive sleep apnea (adult) (pediatric): Secondary | ICD-10-CM

## 2021-01-19 DIAGNOSIS — Z136 Encounter for screening for cardiovascular disorders: Secondary | ICD-10-CM | POA: Diagnosis not present

## 2021-01-19 DIAGNOSIS — E782 Mixed hyperlipidemia: Secondary | ICD-10-CM

## 2021-01-19 MED ORDER — PHENTERMINE HCL 37.5 MG PO TABS: ORAL_TABLET | ORAL | 1 refills | Status: DC

## 2021-01-19 MED ORDER — TADALAFIL 20 MG PO TABS: ORAL_TABLET | ORAL | 3 refills | Status: DC

## 2021-01-19 MED ORDER — MINOCYCLINE HCL 50 MG PO CAPS
ORAL_CAPSULE | ORAL | 1 refills | Status: DC
Start: 2021-01-19 — End: 2022-05-16

## 2021-01-19 MED ORDER — MINOCYCLINE HCL 50 MG PO CAPS: ORAL_CAPSULE | ORAL | 1 refills | Status: DC

## 2021-01-19 MED ORDER — PHENTERMINE HCL 37.5 MG PO TABS
ORAL_TABLET | ORAL | 1 refills | Status: DC
Start: 2021-01-19 — End: 2021-01-19

## 2021-01-19 NOTE — Patient Instructions (Signed)

## 2021-01-19 NOTE — Progress Notes (Signed)
Annual  Screening/Preventative Visit  & Comprehensive Evaluation & Examination      This very nice 50 y.o.  MBM presents for a Screening /Preventative Visit & comprehensive evaluation and management of multiple medical co-morbidities.  Patient has been followed for HTN, HLD, Prediabetes and Vitamin D Deficiency. Patient is on CPAP for OSA with improved restorative sleep.      HTN predates circa 2007. Patient's BP has been controlled and today's BP is at goal -  124/88. Patient denies any cardiac symptoms as chest pain, palpitations, shortness of breath, dizziness or ankle swelling.      Patient's hyperlipidemia is controlled with diet and medications. Patient denies myalgias or other medication SE's. Last lipids were at goal except elevated Trig's:  Lab Results  Component Value Date   CHOL 107 04/08/2020   HDL 30 (L) 04/08/2020   LDLCALC 53 04/08/2020   TRIG 160 (H) 04/08/2020   CHOLHDL 3.6 04/08/2020       Patient has hx/o prediabetes (A1c 6.5% /2012 then 6.1% /2012 & 2016)  and patient denies reactive hypoglycemic symptoms, visual blurring, diabetic polys or paresthesias. Last A1c was not at goal:   Lab Results  Component Value Date   HGBA1C 5.8 (H) 01/05/2020     Patientis onDepo-Testosterone parenteral replacement injection since 2053forhx/o low Testosterone ("252" /2010&"117" /2012)with improved stamina.      Finally, patient has history of Vitamin D Deficiency ("9" /2009) and last vitamin D was at goal:   Lab Results  Component Value Date   VD25OH 76 01/05/2020    Current Outpatient Medications on File Prior to Visit  Medication Sig  . aspirin EC 81 MG tablet Take 81 mg by mouth daily.  . Cholecalciferol (VITAMIN D PO) Take 2,000 Int'l Units by mouth daily.  . cyclobenzaprine  10 MG tablet Take 1/2 to 1 tablet 3 x /day as needed   . ATROVENT 0.06 % nasal spray Use 1 to 2 sprays each nostril 2 to 3 x /day as needed    . losartan-hctz 50-12.5 MG tablet Take   1 tablet   Daily   . Multiple Vitamin  Take 1 tablet daily.  . rosuvastatin  40 MG tablet Take 1 tablet Daily   . testosterone cypio 200 MG/ML injec INJECT 2 ML IM EVERY 2 WKS  . topiramate 50 MG tablet Take 1 tablet  2 x /day at Supper & Bedtime for Dieting    No Known Allergies  Health Maintenance  Topic Date Due  . HIV Screening  Never done  . COLONOSCOPY (Pts 45-66yrs Insurance coverage will need to be confirmed)  Never done  . INFLUENZA VACCINE  07/10/2020  . COVID-19 Vaccine (2 - Pfizer 3-dose series) 12/16/2020  . TETANUS/TDAP  09/18/2025  . Hepatitis C Screening  Completed   Immunization History  Administered Date(s) Administered  . Influenza Inj Mdck Quad \ 11/04/2017  . Influenza Split 09/19/2015, 09/12/2016  . Influenza\ 09/09/2018, 10/21/2019  . PFIZER /SARS-COV-2 Vacc\ 11/25/2020  . PPD Test 12/18/2015, 11/13/2017, 12/17/2018, 01/05/2020  . Td 10/13/2002  . Tdap 09/19/2015    Past Surgical History:  Procedure Laterality Date  . ROTATOR CUFF REPAIR Right 2007  . SHOULDER ARTHROSCOPY Right 2000   Family History  Problem Relation Age of Onset  . Heart disease Father   . Hyperlipidemia Father   . Hypertension Father    Social History   Socioeconomic History  . Marital status: Married    Spouse name: Judeen Hammans  . Number of  children: 1 son  Occupational History  . Policeman  Tobacco Use  . Smoking status: Never Smoker  . Smokeless tobacco: Never Used  . Tobacco comment: occasional cigar  Substance and Sexual Activity  . Alcohol use: Yes    Alcohol/week: 0.0 standard drinks    Comment: occasional  . Drug use: No  . Sexual activity: Not on file    ROS Constitutional: Denies fever, chills, weight loss/gain, headaches, insomnia,  night sweats or change in appetite. Does c/o fatigue. Eyes: Denies redness, blurred vision, diplopia, discharge, itchy or watery eyes.  ENT: Denies discharge, congestion, post nasal drip,  epistaxis, sore throat, earache, hearing loss, dental pain, Tinnitus, Vertigo, Sinus pain or snoring.  Cardio: Denies chest pain, palpitations, irregular heartbeat, syncope, dyspnea, diaphoresis, orthopnea, PND, claudication or edema Respiratory: denies cough, dyspnea, DOE, pleurisy, hoarseness, laryngitis or wheezing.  Gastrointestinal: Denies dysphagia, heartburn, reflux, water brash, pain, cramps, nausea, vomiting, bloating, diarrhea, constipation, hematemesis, melena, hematochezia, jaundice or hemorrhoids Genitourinary: Denies dysuria, frequency, urgency, nocturia, hesitancy, discharge, hematuria or flank pain Musculoskeletal: Denies arthralgia, myalgia, stiffness, Jt. Swelling, pain, limp or strain/sprain. Denies Falls. Skin: Denies puritis, rash, hives, warts, acne, eczema or change in skin lesion Neuro: No weakness, tremor, incoordination, spasms, paresthesia or pain Psychiatric: Denies confusion, memory loss or sensory loss. Denies Depression. Endocrine: Denies change in weight, skin, hair change, nocturia, and paresthesia, diabetic polys, visual blurring or hyper / hypo glycemic episodes.  Heme/Lymph: No excessive bleeding, bruising or enlarged lymph nodes.  Physical Exam  BP 124/88   Pulse 88   Temp (!) 97.4 F (36.3 C)   Resp 16   Ht 5\' 10"  (1.778 m)   Wt 265 lb 3.2 oz (120.3 kg)   BMI 38.05 kg/m   General Appearance: Well nourished and well groomed and in no apparent distress.  Eyes: PERRLA, EOMs, conjunctiva no swelling or erythema, normal fundi and vessels. Sinuses: No frontal/maxillary tenderness ENT/Mouth: EACs patent / TMs  nl. Nares clear without erythema, swelling, mucoid exudates. Oral hygiene is good. No erythema, swelling, or exudate. Tongue normal, non-obstructing. Tonsils not swollen or erythematous. Hearing normal.  Neck: Supple, thyroid not palpable. No bruits, nodes or JVD. Respiratory: Respiratory effort normal.  BS equal and clear bilateral without rales,  rhonci, wheezing or stridor. Cardio: Heart sounds are normal with regular rate and rhythm and no murmurs, rubs or gallops. Peripheral pulses are normal and equal bilaterally without edema. No aortic or femoral bruits. Chest: symmetric with normal excursions and percussion.  Abdomen: Soft, with Nl bowel sounds. Nontender, no guarding, rebound, hernias, masses, or organomegaly.  Lymphatics: Non tender without lymphadenopathy.  Musculoskeletal: Full ROM all peripheral extremities, joint stability, 5/5 strength, and normal gait. Skin: Warm and dry without rashes, lesions, cyanosis, clubbing or  ecchymosis.  Neuro: Cranial nerves intact, reflexes equal bilaterally. Normal muscle tone, no cerebellar symptoms. Sensation intact.  Pysch: Alert and oriented X 3 with normal affect, insight and judgment appropriate.   Assessment and Plan  1. Annual Preventative/Screening Exam    2. Essential hypertension  - EKG 12-Lead - Urinalysis, Routine w reflex microscopic - Microalbumin / creatinine urine ratio - CBC with Differential/Platelet - COMPLETE METABOLIC PANEL WITH GFR - Magnesium - TSH  3. Hyperlipidemia, mixed  - EKG 12-Lead - Urinalysis, Routine w reflex microscopic - Lipid panel - TSH  4. Abnormal glucose  - EKG 12-Lead - Hemoglobin A1c - Insulin, random  5. Vitamin D deficiency  - VITAMIN D 25 Hydroxy   6. OSA on CPAP  7. Screening for colorectal cancer  - POC Hemoccult Bld/Stl   8. Screening examination for pulmonary tuberculosis  - TB Skin Test  9. Prostate cancer screening  - PSA  10. Screening for ischemic heart disease  - EKG 12-Lead  11. FHx: heart disease  - EKG 12-Lead  12. Fatigue, unspecified type  - Iron,Total/Total Iron Binding Cap - Vitamin B12 - Testosterone - CBC with Differential/Platelet - TSH  13. Medication management  - Urinalysis, Routine w reflex microscopic - CBC with Differential/Platelet - COMPLETE METABOLIC PANEL WITH  GFR - Magnesium - Lipid panel - TSH - Hemoglobin A1c - Insulin, random - VITAMIN D 25 Hydroxy   14. 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 Class 2 severe obesity due to excess calories with serious comorbidity and body mass index (BMI) of 36.0 to 36.9 in adult (HCC)  - phentermine 37.5 MG tablet; Take 1/2 to 1 tablet  every Morning  Disp: 90 tab; Rf: 1  17. Erectile dysfunction  - tadalafil (CIALIS) 20 MG ; Take  1/2 to 1 tablet  every  2-3 days  as needed   Disp: 30 tabs; Refill: 3            Patient was counseled in prudent diet, weight control to achieve/maintain BMI less than 25, BP monitoring, regular exercise and medications as discussed.  Discussed med effects and SE's. Routine screening labs and tests as requested with regular follow-up as recommended. Over 40 minutes of exam, counseling, chart review and high complex critical decision making was performed   Kirtland Bouchard, MD

## 2021-01-20 LAB — CBC WITH DIFFERENTIAL/PLATELET
Absolute Monocytes: 462 cells/uL (ref 200–950)
Basophils Absolute: 42 cells/uL (ref 0–200)
Basophils Relative: 0.7 %
Eosinophils Absolute: 162 cells/uL (ref 15–500)
Eosinophils Relative: 2.7 %
HCT: 54.8 % — ABNORMAL HIGH (ref 38.5–50.0)
Hemoglobin: 18.4 g/dL — ABNORMAL HIGH (ref 13.2–17.1)
Lymphs Abs: 1992 cells/uL (ref 850–3900)
MCH: 27.3 pg (ref 27.0–33.0)
MCHC: 33.6 g/dL (ref 32.0–36.0)
MCV: 81.2 fL (ref 80.0–100.0)
MPV: 10.4 fL (ref 7.5–12.5)
Monocytes Relative: 7.7 %
Neutro Abs: 3342 cells/uL (ref 1500–7800)
Neutrophils Relative %: 55.7 %
Platelets: 217 10*3/uL (ref 140–400)
RBC: 6.75 10*6/uL — ABNORMAL HIGH (ref 4.20–5.80)
RDW: 14.4 % (ref 11.0–15.0)
Total Lymphocyte: 33.2 %
WBC: 6 10*3/uL (ref 3.8–10.8)

## 2021-01-20 LAB — MICROALBUMIN / CREATININE URINE RATIO
Creatinine, Urine: 161 mg/dL (ref 20–320)
Microalb Creat Ratio: 4 mcg/mg creat (ref <30)
Microalb, Ur: 0.7 mg/dL

## 2021-01-20 LAB — COMPLETE METABOLIC PANEL WITH GFR
AG Ratio: 1.9 (calc) (ref 1.0–2.5)
ALT: 45 U/L (ref 9–46)
AST: 44 U/L — ABNORMAL HIGH (ref 10–40)
Albumin: 4.6 g/dL (ref 3.6–5.1)
Alkaline phosphatase (APISO): 55 U/L (ref 36–130)
BUN/Creatinine Ratio: 7 (calc) (ref 6–22)
BUN: 10 mg/dL (ref 7–25)
CO2: 27 mmol/L (ref 20–32)
Calcium: 10 mg/dL (ref 8.6–10.3)
Chloride: 104 mmol/L (ref 98–110)
Creat: 1.46 mg/dL — ABNORMAL HIGH (ref 0.60–1.35)
GFR, Est African American: 65 mL/min/{1.73_m2} (ref >=60)
GFR, Est Non African American: 56 mL/min/{1.73_m2} — ABNORMAL LOW (ref >=60)
Globulin: 2.4 g/dL (calc) (ref 1.9–3.7)
Glucose, Bld: 107 mg/dL — ABNORMAL HIGH (ref 65–99)
Potassium: 4.2 mmol/L (ref 3.5–5.3)
Sodium: 140 mmol/L (ref 135–146)
Total Bilirubin: 0.5 mg/dL (ref 0.2–1.2)
Total Protein: 7 g/dL (ref 6.1–8.1)

## 2021-01-20 LAB — URINALYSIS, ROUTINE W REFLEX MICROSCOPIC
Bilirubin Urine: NEGATIVE
Glucose, UA: NEGATIVE
Hgb urine dipstick: NEGATIVE
Ketones, ur: NEGATIVE
Leukocytes,Ua: NEGATIVE
Nitrite: NEGATIVE
Protein, ur: NEGATIVE
Specific Gravity, Urine: 1.018 (ref 1.001–1.03)
pH: 7.5 (ref 5.0–8.0)

## 2021-01-20 LAB — MAGNESIUM: Magnesium: 2.1 mg/dL (ref 1.5–2.5)

## 2021-01-20 LAB — INSULIN, RANDOM: Insulin: 89 u[IU]/mL — ABNORMAL HIGH

## 2021-01-20 LAB — LIPID PANEL
Cholesterol: 115 mg/dL (ref <200)
HDL: 28 mg/dL — ABNORMAL LOW (ref >=40)
LDL Cholesterol (Calc): 55 mg/dL (calc)
Non-HDL Cholesterol (Calc): 87 mg/dL (calc) (ref <130)
Total CHOL/HDL Ratio: 4.1 (calc) (ref <5.0)
Triglycerides: 273 mg/dL — ABNORMAL HIGH (ref <150)

## 2021-01-20 LAB — TSH: TSH: 0.77 mIU/L (ref 0.40–4.50)

## 2021-01-20 LAB — HEMOGLOBIN A1C
Hgb A1c MFr Bld: 6.4 % of total Hgb — ABNORMAL HIGH (ref <5.7)
Mean Plasma Glucose: 137 mg/dL
eAG (mmol/L): 7.6 mmol/L

## 2021-01-20 LAB — IRON, TOTAL/TOTAL IRON BINDING CAP
%SAT: 28 % (calc) (ref 20–48)
Iron: 82 ug/dL (ref 50–180)
TIBC: 298 mcg/dL (calc) (ref 250–425)

## 2021-01-20 LAB — PSA: PSA: 0.35 ng/mL (ref <=4.0)

## 2021-01-20 LAB — VITAMIN D 25 HYDROXY (VIT D DEFICIENCY, FRACTURES): Vit D, 25-Hydroxy: 54 ng/mL (ref 30–100)

## 2021-01-20 LAB — TESTOSTERONE: Testosterone: 790 ng/dL (ref 250–827)

## 2021-01-20 LAB — VITAMIN B12: Vitamin B-12: 287 pg/mL (ref 200–1100)

## 2021-01-21 NOTE — Progress Notes (Signed)
========================================================== - Test results slightly outside the reference range are not unusual. If there is anything important, I will review this with you,  otherwise it is considered normal test values.  If you have further questions,  please do not hesitate to contact me at the office or via My Chart.  ========================================================== ==========================================================  - Iron Level is Normal & OK   ========================================================== ==========================================================  -  Vitamin B12 = 287  is   Very Low  (Ideal or Goal Vit B12 is between 450 - 1,100)   Low Vitamin  B12 may be associated with Anemia, Fatigue,   Peripheral Neuropathy, Dementia, "Brain Fog" & Depression  - Recommend take a sub-lingual form of Vitamin B12 tablet   1,000 to 5,000 mcg tab that you dissolve under your tongue /Daily   - Can get Baron Sane - best price at [employer] or on [employer]  ========================================================== ==========================================================  - PSA is Low - Great  !  ========================================================== ==========================================================  - Testosterone is Normal   ========================================================== ==========================================================  - Total Chol = 115 and LDL Chol = 55 - Both  Excellent   - Very low risk for Heart Attack  / Stroke ========================================================  - But ......  Triglycerides (   273   ) or fats in blood are too high  (goal is less than 150)    - Recommend avoid fried & greasy foods,  sweets / candy,   - Avoid white rice  (brown or wild rice or Quinoa is OK),   - Avoid white potatoes  (sweet potatoes are OK)   - Avoid anything made from white flour  - bagels,  doughnuts, rolls, buns, biscuits, white and   wheat breads, pizza crust and traditional  pasta made of white flour & egg white  - (vegetarian pasta or spinach or wheat pasta is OK).    - Multi-grain bread is OK - like multi-grain flat bread or  sandwich thins.   - Avoid alcohol in excess.   - Exercise is also important.  ========================================================== ==========================================================  - A1c - 6.4% is elevated higher again   Blood sugar and A1c  are in the borderline and  early or pre-diabetes range which has the same   300% increased risk for heart attack, stroke, cancer and   alzheimer- type vascular dementia as full blown diabetes.   But the good news is that diet, exercise with  weight loss can cure the early diabetes at this point.  oooooooooooooooooooooooooooooooooooooooooooooooooooooooooooooo   -  It is very important that you work harder with diet by  avoiding all foods that are white except chicken,   fish & calliflower.  - Avoid white rice  (brown & wild rice is OK),   - Avoid white potatoes  (sweet potatoes in moderation is OK),   White bread or wheat bread or anything made out of   white flour like bagels, donuts, rolls, buns, biscuits, cakes,  - pastries, cookies, pizza crust, and pasta (made from  white flour & egg whites)   - vegetarian pasta or spinach or wheat pasta is OK.  - Multigrain breads like Arnold's, Pepperidge Farm or   multigrain sandwich thins or high fiber breads like   [place] bread or "Dave's Killer" breads that are  4 to 5 grams fiber per slice !  are best.    Diet, exercise and weight loss can reverse and cure  diabetes in the early stages.    ========================================================== ==========================================================  - Insulin = 89  elevated (Normal or Goal is below 20)   Elevated Insulin shows insulin resistance   - a sign  of early diabetes and associated with a 300 % greater risk for   heart attacks, strokes, cancer & Alzheimer type vascular dementia   - All this can be cured  and prevented with losing weight   - get Dr Fara Olden Fuhrman's book 'the End of Diabetes" and "the End of Dieting"  -   and add many years of good health to your life.  ============================================================ ============================================================

## 2021-04-22 ENCOUNTER — Other Ambulatory Visit: Payer: Self-pay | Admitting: Internal Medicine

## 2021-04-22 DIAGNOSIS — Z6836 Body mass index (BMI) 36.0-36.9, adult: Secondary | ICD-10-CM

## 2021-04-22 MED ORDER — TOPIRAMATE 50 MG PO TABS: ORAL_TABLET | ORAL | 3 refills | Status: DC

## 2021-04-24 ENCOUNTER — Other Ambulatory Visit: Payer: Self-pay | Admitting: Internal Medicine

## 2021-04-24 MED ORDER — HYDROCORTISONE ACETATE 1 % EX OINT: TOPICAL_OINTMENT | CUTANEOUS | 1 refills | Status: DC

## 2021-04-24 MED ORDER — DEXAMETHASONE 4 MG PO TABS: ORAL_TABLET | ORAL | 0 refills | Status: DC

## 2021-04-27 ENCOUNTER — Other Ambulatory Visit: Payer: Self-pay | Admitting: Internal Medicine

## 2021-04-27 MED ORDER — TOPIRAMATE 50 MG PO TABS: ORAL_TABLET | ORAL | 3 refills | Status: DC

## 2021-05-01 ENCOUNTER — Ambulatory Visit: Payer: 59 | Admitting: Internal Medicine

## 2021-05-01 ENCOUNTER — Other Ambulatory Visit: Payer: Self-pay

## 2021-05-01 ENCOUNTER — Encounter: Payer: Self-pay | Admitting: Internal Medicine

## 2021-05-01 VITALS — BP 116/80 | HR 96 | Temp 97.3°F | Resp 16 | Ht 70.0 in | Wt 254.4 lb

## 2021-05-01 DIAGNOSIS — G4733 Obstructive sleep apnea (adult) (pediatric): Secondary | ICD-10-CM

## 2021-05-01 DIAGNOSIS — K6289 Other specified diseases of anus and rectum: Secondary | ICD-10-CM

## 2021-05-01 DIAGNOSIS — E782 Mixed hyperlipidemia: Secondary | ICD-10-CM | POA: Diagnosis not present

## 2021-05-01 DIAGNOSIS — I10 Essential (primary) hypertension: Secondary | ICD-10-CM | POA: Diagnosis not present

## 2021-05-01 DIAGNOSIS — R7309 Other abnormal glucose: Secondary | ICD-10-CM

## 2021-05-01 DIAGNOSIS — K625 Hemorrhage of anus and rectum: Secondary | ICD-10-CM

## 2021-05-01 DIAGNOSIS — E559 Vitamin D deficiency, unspecified: Secondary | ICD-10-CM

## 2021-05-01 DIAGNOSIS — Z79899 Other long term (current) drug therapy: Secondary | ICD-10-CM

## 2021-05-01 DIAGNOSIS — E349 Endocrine disorder, unspecified: Secondary | ICD-10-CM

## 2021-05-01 MED ORDER — HYDROCORTISONE ACETATE 25 MG RE SUPP: RECTAL | 1 refills | Status: DC

## 2021-05-01 NOTE — Patient Instructions (Signed)

## 2021-05-01 NOTE — Progress Notes (Signed)
Future Appointments  Date Time Provider [place]  05/01/2021  4:30 PM Unk Pinto, MD [facility] None  08/07/2021  2:30 PM Unk Pinto, MD [facility] None  01/30/2022 10:00 AM Unk Pinto, MD [facility] None    History of Present Illness:       This very nice 50 y.o. MBM presents for 3 month follow up with HTN, HLD, Pre-Diabetes, OSA/CPAP and Vitamin D Deficiency.  Patient has improved sleep hygiene with CPAP. 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        Patient also presents with c/o rectal /anal pain and despite Anusol-HC  cream x 1 week his sx's are no better. On exam hemoccult tested (+).            Patient is treated for HTN (2007)  & BP has been controlled at home. Today's BP is at goal - 116/80. Patient has had no complaints of any cardiac type chest pain, palpitations, dyspnea / orthopnea / PND, dizziness, claudication, or dependent edema.       Hyperlipidemia is controlled with diet & meds. Patient denies myalgias or other med SE's. Last Lipids were at goal except elevated Trig's:  Lab Results  Component Value Date   CHOL 115 01/19/2021   HDL 28 (L) 01/19/2021   LDLCALC 55 01/19/2021   TRIG 273 (H) 01/19/2021   CHOLHDL 4.1 01/19/2021     Also, the patient has history of PreDiabetes (A1c 6.5% /2012 then 6.1%/2012 &2016)and has had no symptoms of reactive hypoglycemia, diabetic polys, paresthesias or visual blurring.  Last A1c was not at goal:  Lab Results  Component Value Date   HGBA1C 6.4 (H) 01/19/2021       Patientis onDepo-Testosterone  injection since 2063forhx/o low T ("252"/2010&"117"/2012)with improved stamina.       Further, the patient also has history of Vitamin D Deficiency ("9" /2009) and supplements vitamin D without any suspected side-effects. Last vitamin D was near goal (70-100):  Lab Results   Component Value Date   VD25OH 54 01/19/2021     Current Outpatient Medications on File Prior to Visit  Medication Sig  . aspirin EC 81 MG tablet Take  daily.  Marland Kitchen VITAMIN D 2,000 Int'l Units  Take daily.  . Hydrocortisone Acetate 1 % OINT Apply 3 to 4 x /day to Inflamed area  . ipratropium  0.06 % nasal spray Use 1 to 2 sprays each nostril 2 to 3 x /day as needed   . losartan-hctz 50-12.5 MG tablet TAKE ONE TABLET \ DAILY \  . minocycline  50 MG capsule Take  1 capsule  2 x /day  with Food  for Skin Infection  . Multiple Vitamin  Take 1 tablet  daily.  . phentermine  37.5 MG tablet Take 1/2-1 tab every Morning  for Dieting & Weight Loss  . rosuvastatin  40 MG tablet Take 1 tablet Daily for Cholesterol  . tadalafil  20 MG tablet Take  1/2 to 1 tablet  every  2 to 3 days  as needed for XXXX  . testosterone cypio 200 mg/ml injec INJECT 1ML IM EVERY  WEEK  . topiramate 50 MG tablet Take  1 tab  2 x/day -Supper & Bedtime for Dieting    No Known Allergies   PMHx:   Past Medical History:  Diagnosis Date  . Hyperlipidemia   . Hypertension   . Other testicular hypofunction   .  Prediabetes      Immunization History  Administered Date(s) Administered  . Influenza Inj Mdck Quad   11/04/2017  . Influenza Split 09/22/2014, 09/19/2015, 09/12/2016  . Influenza-  09/09/2018, 10/21/2019  . PFIZER SARS-COV-2 Vacc 11/25/2020  . PPD Test  12/17/2018, 01/05/2020, 01/19/2021  . Td 10/13/2002  . Tdap 09/19/2015     Past Surgical History:  Procedure Laterality Date  . ROTATOR CUFF REPAIR Right 2007  . SHOULDER ARTHROSCOPY Right 2000    FHx:    Reviewed / unchanged  SHx:    Reviewed / unchanged   Systems Review:  Constitutional: Denies fever, chills, wt changes, headaches, insomnia, fatigue, night sweats, change in appetite. Eyes: Denies redness, blurred vision, diplopia, discharge, itchy, watery eyes.  ENT: Denies discharge, congestion, post nasal drip, epistaxis, sore throat,  earache, hearing loss, dental pain, tinnitus, vertigo, sinus pain, snoring.  CV: Denies chest pain, palpitations, irregular heartbeat, syncope, dyspnea, diaphoresis, orthopnea, PND, claudication or edema. Respiratory: denies cough, dyspnea, DOE, pleurisy, hoarseness, laryngitis, wheezing.  Gastrointestinal: Denies dysphagia, odynophagia, heartburn, reflux, water brash, abdominal pain or cramps, nausea, vomiting, bloating, diarrhea, constipation, hematemesis, melena, hematochezia  or hemorrhoids. Genitourinary: Denies dysuria, frequency, urgency, nocturia, hesitancy, discharge, hematuria or flank pain. Musculoskeletal: Denies arthralgias, myalgias, stiffness, jt. swelling, pain, limping or strain/sprain.  Skin: Denies pruritus, rash, hives, warts, acne, eczema or change in skin lesion(s). Neuro: No weakness, tremor, incoordination, spasms, paresthesia or pain. Psychiatric: Denies confusion, memory loss or sensory loss. Endo: Denies change in weight, skin or hair change.  Heme/Lymph: No excessive bleeding, bruising or enlarged lymph nodes.  Physical Exam  BP 116/80   Pulse 96   Temp (!) 97.3 F (36.3 C)   Resp 16   Ht 5\' 10"  (1.778 m)   Wt 254 lb 6.4 oz (115.4 kg)   BMI 36.50 kg/m   Appears  well nourished, well groomed  and in no distress.  Eyes: PERRLA, EOMs, conjunctiva no swelling or erythema. Sinuses: No frontal/maxillary tenderness ENT/Mouth: EAC's clear, TM's nl w/o erythema, bulging. Nares clear w/o erythema, swelling, exudates. Oropharynx clear without erythema or exudates. Oral hygiene is good. Tongue normal, non obstructing. Hearing intact.  Neck: Supple. Thyroid not palpable. Car 2+/2+ without bruits, nodes or JVD. Chest: Respirations nl with BS clear & equal w/o rales, rhonchi, wheezing or stridor.  Cor: Heart sounds normal w/ regular rate and rhythm without sig. murmurs, gallops, clicks or rubs. Peripheral pulses normal and equal  without edema.  Abdomen: Soft & bowel  sounds normal. Non-tender w/o guarding, rebound, hernias, masses or organomegaly.   DRE : Benign hemorr. tag at 6 o'clock. Suspect 12 o'clock  Exquisitely tender cryptitis / fissure. Unable to endure DRE, but hemoccult tested (+).   Lymphatics: Unremarkable.  Musculoskeletal: Full ROM all peripheral extremities, joint stability, 5/5 strength and normal gait.  Skin: Warm, dry without exposed rashes, lesions or ecchymosis apparent.  Neuro: Cranial nerves intact, reflexes equal bilaterally. Sensory-motor testing grossly intact. Tendon reflexes grossly intact.  Pysch: Alert & oriented x 3.  Insight and judgement nl & appropriate. No ideations.  Assessment and Plan:  1. Essential hypertension  - Continue medication, monitor blood pressure at home.  - Continue DASH diet.  Reminder to go to the ER if any CP,  SOB, nausea, dizziness, severe HA, changes vision/speech.  - CBC with Differential/Platelet - COMPLETE METABOLIC PANEL WITH GFR - Magnesium - TSH  2. Hyperlipidemia, mixed  - Continue diet/meds, exercise,& lifestyle modifications.  - Continue monitor periodic cholesterol/liver & renal  functions   - Lipid panel - TSH  3. Abnormal glucose  - Continue diet, exercise  - Lifestyle modifications.  - Monitor appropriate labs  - Hemoglobin A1c - Insulin, random  4. Vitamin D deficiency  - Continue supplementation.  - VITAMIN D 25 Hydroxy   5. Anorectal pain  - ANUSOL-HC 25 MG supp; Insert 1 rectal supp 3 x /day  Disp: 24 supp; Rf: 1  - Ambulatory referral to Gastroenterology  6. Idiopathic proctitis with rectal bleeding  - Ambulatory referral to Gastroenterology  7. OSA on CPAP   8. Testosterone deficiency   9. Medication management  - CBC with Differential/Platelet - COMPLETE METABOLIC PANEL WITH GFR - Magnesium - Lipid panel - TSH - Hemoglobin A1c - Insulin, random - VITAMIN D 25 Hydroxy         Discussed  regular exercise, BP monitoring, weight  control to achieve/maintain BMI less than 25 and discussed med and SE's. Recommended labs to assess and monitor clinical status with further disposition pending results of labs.  I discussed the assessment and treatment plan with the patient. The patient was provided an opportunity to ask questions and all were answered. The patient agreed with the plan and demonstrated an understanding of the instructions.  I provided over 30 minutes of exam, counseling, chart review and  complex critical decision making.        The patient was advised to call back or seek an in-person evaluation if the symptoms worsen or if the condition fails to improve as anticipated.   Kirtland Bouchard, MD

## 2021-05-02 ENCOUNTER — Other Ambulatory Visit: Payer: Self-pay | Admitting: Internal Medicine

## 2021-05-02 DIAGNOSIS — K625 Hemorrhage of anus and rectum: Secondary | ICD-10-CM

## 2021-05-02 LAB — CBC WITH DIFFERENTIAL/PLATELET
Absolute Monocytes: 938 cells/uL (ref 200–950)
Basophils Absolute: 68 cells/uL (ref 0–200)
Basophils Relative: 0.5 %
Eosinophils Absolute: 0 cells/uL — ABNORMAL LOW (ref 15–500)
Eosinophils Relative: 0 %
HCT: 40.6 % (ref 38.5–50.0)
Hemoglobin: 13.6 g/dL (ref 13.2–17.1)
Lymphs Abs: 2135 cells/uL (ref 850–3900)
MCH: 26.2 pg — ABNORMAL LOW (ref 27.0–33.0)
MCHC: 33.5 g/dL (ref 32.0–36.0)
MCV: 78.1 fL — ABNORMAL LOW (ref 80.0–100.0)
MPV: 9.9 fL (ref 7.5–12.5)
Monocytes Relative: 6.9 %
Neutro Abs: 10458 cells/uL — ABNORMAL HIGH (ref 1500–7800)
Neutrophils Relative %: 76.9 %
Platelets: 303 10*3/uL (ref 140–400)
RBC: 5.2 10*6/uL (ref 4.20–5.80)
RDW: 16 % — ABNORMAL HIGH (ref 11.0–15.0)
Total Lymphocyte: 15.7 %
WBC: 13.6 10*3/uL — ABNORMAL HIGH (ref 3.8–10.8)

## 2021-05-02 LAB — COMPLETE METABOLIC PANEL WITH GFR
AG Ratio: 1.8 (calc) (ref 1.0–2.5)
ALT: 30 U/L (ref 9–46)
AST: 17 U/L (ref 10–40)
Albumin: 4.2 g/dL (ref 3.6–5.1)
Alkaline phosphatase (APISO): 68 U/L (ref 36–130)
BUN: 22 mg/dL (ref 7–25)
CO2: 23 mmol/L (ref 20–32)
Calcium: 9.4 mg/dL (ref 8.6–10.3)
Chloride: 107 mmol/L (ref 98–110)
Creat: 0.95 mg/dL (ref 0.60–1.35)
GFR, Est African American: 108 mL/min/{1.73_m2} (ref >=60)
GFR, Est Non African American: 94 mL/min/{1.73_m2} (ref >=60)
Globulin: 2.4 g/dL (calc) (ref 1.9–3.7)
Glucose, Bld: 171 mg/dL — ABNORMAL HIGH (ref 65–99)
Potassium: 3.9 mmol/L (ref 3.5–5.3)
Sodium: 140 mmol/L (ref 135–146)
Total Bilirubin: 0.5 mg/dL (ref 0.2–1.2)
Total Protein: 6.6 g/dL (ref 6.1–8.1)

## 2021-05-02 LAB — HEMOGLOBIN A1C
Hgb A1c MFr Bld: 6.7 % of total Hgb — ABNORMAL HIGH (ref <5.7)
Mean Plasma Glucose: 146 mg/dL
eAG (mmol/L): 8.1 mmol/L

## 2021-05-02 LAB — LIPID PANEL
Cholesterol: 132 mg/dL (ref <200)
HDL: 39 mg/dL — ABNORMAL LOW (ref >=40)
LDL Cholesterol (Calc): 71 mg/dL (calc)
Non-HDL Cholesterol (Calc): 93 mg/dL (calc) (ref <130)
Total CHOL/HDL Ratio: 3.4 (calc) (ref <5.0)
Triglycerides: 134 mg/dL (ref <150)

## 2021-05-02 LAB — MAGNESIUM: Magnesium: 2.1 mg/dL (ref 1.5–2.5)

## 2021-05-02 LAB — VITAMIN D 25 HYDROXY (VIT D DEFICIENCY, FRACTURES): Vit D, 25-Hydroxy: 61 ng/mL (ref 30–100)

## 2021-05-02 LAB — INSULIN, RANDOM: Insulin: 130.9 u[IU]/mL — ABNORMAL HIGH

## 2021-05-02 LAB — TSH: TSH: 0.26 mIU/L — ABNORMAL LOW (ref 0.40–4.50)

## 2021-05-02 MED ORDER — CEFUROXIME AXETIL 250 MG PO TABS: ORAL_TABLET | ORAL | 0 refills | Status: DC

## 2021-05-02 NOTE — Progress Notes (Signed)
============================================================ -   Test results slightly outside the reference range are not unusual. If there is anything important, I will review this with you,  otherwise it is considered normal test values.  If you have further questions,  please do not hesitate to contact me at the office or via My Chart.  ============================================================ ============================================================  -  CBC shows Red Cell Count  (Hgb) is in normal range , but is decreased                   from your usual level, So recommend re-checking in about a month.  - Also WBC is slightly elevated & is suspect for an infection  ============================================================ ============================================================  - Random Glucose is elevated up to 171 Mg%              (  Ideal or Goal is less than 120 mg%  )   - and A1c is up again to 6.7% - and when reaches 7.0% is when start   Diabetic meds, So important to work a little harder with diet & lose more weight.  ============================================================ ============================================================  - Also Insulin = 130.9 is very elevated   ( and Ideal or Goal is less than 20  !  )   - And elevated Insulin shows insulin resistance - a sign of early diabetes and   associated with a 300 % greater risk for heart attacks, strokes, cancer &   Alzheimer type vascular dementia - All this can be cured  and prevented with   losing weight   - get Dr Fara Olden Fuhrman's book 'the End of Diabetes" and "the End of Dieting"   - and add many years of good health to your life. ============================================================ ============================================================  - Vitamin D = 61 - Excellent   ============================================================ ============================================================  - All Else - CBC - Kidneys - Electrolytes - Liver - Magnesium & Thyroid    - all  Normal / OK ============================================================ ============================================================

## 2021-05-03 ENCOUNTER — Telehealth: Payer: Self-pay | Admitting: Gastroenterology

## 2021-05-03 NOTE — Telephone Encounter (Signed)
We received an urgent request for patient to be seen for rectal pain and bleeding.  Next availability we have with an APP on 05/24/21 but patient is requesting to be seen sooner.  Please advise.

## 2021-05-03 NOTE — Telephone Encounter (Signed)
Lm on vm for patient to return call.   He has been scheduled with Ellouise Newer, PA-C on Tuesday, 05/09/21 at 1:30 PM.

## 2021-05-04 NOTE — Telephone Encounter (Signed)
Spoke with patient, he is aware of his scheduled appt and location of the office. He verbalized understanding and had no concerns at the end of the call.

## 2021-05-09 ENCOUNTER — Encounter: Payer: Self-pay | Admitting: Physician Assistant

## 2021-05-09 ENCOUNTER — Ambulatory Visit: Payer: 59 | Admitting: Physician Assistant

## 2021-05-09 VITALS — BP 140/102 | HR 100 | Ht 70.0 in | Wt 256.0 lb

## 2021-05-09 DIAGNOSIS — K625 Hemorrhage of anus and rectum: Secondary | ICD-10-CM

## 2021-05-09 DIAGNOSIS — K602 Anal fissure, unspecified: Secondary | ICD-10-CM

## 2021-05-09 DIAGNOSIS — K6289 Other specified diseases of anus and rectum: Secondary | ICD-10-CM

## 2021-05-09 MED ORDER — AMBULATORY NON FORMULARY MEDICATION: 1 refills | Status: DC

## 2021-05-09 NOTE — Patient Instructions (Addendum)
If you are age 50 or older, your body mass index should be between 23-30. Your Body mass index is 36.73 kg/m. If this is out of the aforementioned range listed, please consider follow up with your Primary Care Provider.  If you are age 50 or younger, your body mass index should be between 19-25. Your Body mass index is 36.73 kg/m. If this is out of the aformentioned range listed, please consider follow up with your Primary Care Provider.   We have sent a prescription for nitroglycerin 0.125% gel to [facility]. You should apply a pea size amount to your rectum three times daily x 6-8 weeks.  [facility]'s information is below: Address: [address], [place], [place] [address]  [phone]  *Please DO NOT go directly from our office to pick up this medication! Give the pharmacy 1 day to process the prescription as this is compounded and takes time to make.  Start over the [employer] w/ lidocaine as needed.   Keep stools soft.  How to Take a [employer] A sitz bath is a warm water bath that may be used to care for your rectum, genital area, or the area between your rectum and genitals (perineum). In a sitz bath, the water only comes up to your hips and covers your buttocks. A sitz bath may be done in a bathtub or with a portable sitz bath that fits over the toilet. Your health care provider may recommend a sitz bath to help:  Relieve pain and discomfort after delivering a baby.  Relieve pain and itching from hemorrhoids or anal fissures.  Relieve pain after certain surgeries.  Relax muscles that are sore or tight. How to take a sitz bath Take 3-4 sitz baths a day, or as many as told by your health care provider. Bathtub sitz bath To take a sitz bath in a bathtub: 1. Partially fill a bathtub with warm water. The water should be deep enough to cover your hips and buttocks when you are sitting in the tub. 2. Follow your health care provider's instructions  if you are told to put medicine in the water. 3. Sit in the water. Open the tub drain a little, and leave it open during your bath. 4. Turn on the warm water again, enough to replace the water that is draining out. Keep the water running throughout your bath. This helps keep the water at the right level and temperature. 5. Soak in the water for 15-20 minutes, or as long as told by your health care provider. 6. When you are done, be careful when you stand up. You may feel dizzy. 7. After the sitz bath, pat yourself dry. Do not rub your skin to dry it.   Over-the-toilet sitz bath To take a sitz bath with an over-the-toilet basin: 1. Follow the manufacturer's instructions. 2. Fill the basin with warm water. 3. Follow your health care provider's instructions if you were told to put medicine in the water. 4. Sit on the seat. Make sure the water covers your buttocks and perineum. 5. Soak in the water for 15-20 minutes, or as long as told by your health care provider. 6. After the sitz bath, pat yourself dry. Do not rub your skin to dry it. 7. Clean and dry the basin between uses. 8. Discard the basin if it cracks, or according to the manufacturer's instructions.   Contact a health care provider if:  Your pain or itching gets worse. Do not  continue with sitz baths if your symptoms get worse.  You have new symptoms. Do not continue with sitz baths until you talk with your health care provider. Summary  A sitz bath is a warm water bath in which the water only comes up to your hips and covers your buttocks.  A sitz bath may help relieve pain and discomfort after delivering a baby. It also may help with pain and itching from hemorrhoids or anal fissures, or pain after certain surgeries. It can also help to relax muscles that are sore or tight.  Take 3-4 sitz baths a day, or as many as told by your health care provider. Soak in the water for 15-20 minutes.  Do not continue with sitz baths if your  symptoms get worse. This information is not intended to replace advice given to you by your health care provider. Make sure you discuss any questions you have with your health care provider. Document Revised: 08/11/2020 Document Reviewed: 08/11/2020 Elsevier Patient Education  2021 [facility] providers would like to encourage you to use [facility] to communicate with providers for non-urgent requests or questions.  Due to long hold times on the telephone, sending your provider a message by [facility] may be a faster and more efficient way to get a response.  Please allow 48 business hours for a response.  Please remember that this is for non-urgent requests.   It was a pleasure to see you today!  Thank you for trusting me with your gastrointestinal care!     Ellouise Newer, PA-C

## 2021-05-09 NOTE — Progress Notes (Signed)
.   Chief Complaint: Rectal bleeding and rectal pain  HPI:    Mike Cohen is a 50 year old male with a past medical history as listed below, who was referred to me by Unk Pinto, MD for a complaint of rectal bleeding and rectal pain.      05/02/2019 patient seen by PCP and at that time described rectal/anal pain despite Anusol HC cream x1 week.  On exam he was Hemoccult positive.  He was given suppositories to be inserted 3 times a day.    Today, the patient explains that he has had rectal pain for the past 4 weeks.  Initially thought this just a hemorrhoid and tried a lot of over-the-counter products as well as doing sitz bath's, but this did not help.  He then started bleeding and saw his PCP who prescribed Anusol cream and "a pill", he has continued though with bright red blood anytime he passes a stool as well as rectal pain which is sharp and intense and often throbs afterwards.  This is making it uncomfortable for him to walk or sit or really do anything.  Was alternating Ibuprofen and Tylenol at one point to help with the pain.  Denies any problems with diarrhea or constipation.  Denies previous episodes of bleeding.    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    Denies fever, chills, weight loss or family history of colon cancer.  Past Medical History:  Diagnosis Date  . Hyperlipidemia   . Hypertension   . Other testicular hypofunction   . Prediabetes     Past Surgical History:  Procedure Laterality Date  . ROTATOR CUFF REPAIR Right 2007  . SHOULDER ARTHROSCOPY Right 2000    Current Outpatient Medications  Medication Sig Dispense Refill  . aspirin EC 81 MG tablet Take 81 mg by mouth daily.    . cefUROXime (CEFTIN) 250 MG tablet Take 1 tablet 2 x /day with Food for Infection 20 tablet 0  . Cholecalciferol (VITAMIN D PO) Take 2,000 Int'l Units by mouth daily.    . hydrocortisone (ANUSOL-HC) 25 MG suppository Insert 1  rectal suppository 3 x /day 24 suppository 1  . Hydrocortisone Acetate 1 % OINT Apply 3 to 4 x /day to Inflamed area 60 g 1  . ipratropium (ATROVENT) 0.06 % nasal spray Use 1 to 2 sprays each nostril 2 to 3 x /day as needed for watery nasal drainage 45 mL 3  . losartan-hydrochlorothiazide (HYZAAR) 50-12.5 MG tablet TAKE ONE TABLET BY MOUTH DAILY FOR BLOOD PRESSURE 90 tablet 1  . minocycline (MINOCIN) 50 MG capsule Take  1 capsule  2 x /day  with Food  for Skin Infection 180 capsule 1  . Multiple Vitamin (MULTIVITAMIN) tablet Take 1 tablet by mouth daily.    . phentermine (ADIPEX-P) 37.5 MG tablet Take 1/2 to 1 tablet  every Morning  for Dieting & Weight Loss 90 tablet 1  . rosuvastatin (CRESTOR) 40 MG tablet Take 1 tablet Daily for Cholesterol 90 tablet 1  . sildenafil (VIAGRA) 100 MG tablet Take 1/2 to 1 tablet Daily as needed for XXXX 30 tablet 0  . SYRINGE-NEEDLE, DISP, 3 ML (B-D 3CC LUER-LOK SYR 21GX1-1/2) 21G X 1-1/2" 3 ML MISC Test sugar once daily 50 each 0  . tadalafil (CIALIS) 20 MG tablet Take  1/2 to 1 tablet  every  2 to 3 days  as needed for XXXX 30 tablet 3  . testosterone cypionate (  DEPOTESTOSTERONE CYPIONATE) 200 MG/ML injection INJECT 2 ML INTO THE MUSCLE EVERY 2 WEEKS 12 mL 2  . topiramate (TOPAMAX) 50 MG tablet Take  1 tablet  2 x /day  at Supper & Bedtime for Dieting & Weight Loss 180 tablet 3   No current facility-administered medications for this visit.    Allergies as of 05/09/2021  . (No Known Allergies)    Family History  Problem Relation Age of Onset  . Heart disease Father   . Hyperlipidemia Father   . Hypertension Father     Social History   Socioeconomic History  . Marital status: Married    Spouse name: Not on file  . Number of children: Not on file  . Years of education: Not on file  . Highest education level: Not on file  Occupational History  . Not on file  Tobacco Use  . Smoking status: Never Smoker  . Smokeless tobacco: Never Used  . Tobacco  comment: occasional cigar  Substance and Sexual Activity  . Alcohol use: Yes    Alcohol/week: 0.0 standard drinks    Comment: occasional  . Drug use: No  . Sexual activity: Not on file  Other Topics Concern  . Not on file  Social History Narrative  . Not on file   Social Determinants of Health   Financial Resource Strain: Not on file  Food Insecurity: Not on file  Transportation Needs: Not on file  Physical Activity: Not on file  Stress: Not on file  Social Connections: Not on file  Intimate Partner Violence: Not on file    Review of Systems:    Constitutional: No weight loss, fever or chills Skin: No rash  Cardiovascular: No chest pain Respiratory: No SOB Gastrointestinal: See HPI and otherwise negative Genitourinary: No dysuria  Neurological: No headache, dizziness or syncope Musculoskeletal: No new muscle or joint pain Hematologic: No bruising Psychiatric: No history of depression or anxiety   Physical Exam:  Vital signs: BP (!) 140/102   Pulse 100   Ht 5\' 10"  (1.778 m)   Wt 256 lb (116.1 kg)   BMI 36.73 kg/m   Constitutional:   Pleasant AA male appears to be in NAD, Well developed, Well nourished, alert and cooperative Head:  Normocephalic and atraumatic. Eyes:   PEERL, EOMI. No icterus. Conjunctiva pink. Ears:  Normal auditory acuity. Neck:  Supple Throat: Oral cavity and pharynx without inflammation, swelling or lesion.  Respiratory: Respirations even and unlabored. Lungs clear to auscultation bilaterally.   No wheezes, crackles, or rhonchi.  Cardiovascular: Normal S1, S2. No MRG. Regular rate and rhythm. No peripheral edema, cyanosis or pallor.  Gastrointestinal:  Soft, nondistended, nontender. No rebound or guarding. Normal bowel sounds. No appreciable masses or hepatomegaly. Rectal: External exam: Large/deep posterior fissure with active bleeding at time of exam, tender to palpation; Internal exam: Deferred due to pain Msk:  Symmetrical without gross  deformities. Without edema, no deformity or joint abnormality.  Neurologic:  Alert and  oriented x4;  grossly normal neurologically.  Skin:   Dry and intact without significant lesions or rashes. Psychiatric: Demonstrates good judgement and reason without abnormal affect or behaviors.  RELEVANT LABS AND IMAGING: CBC    Component Value Date/Time   WBC 13.6 (H) 05/01/2021 1627   RBC 5.20 05/01/2021 1627   HGB 13.6 05/01/2021 1627   HCT 40.6 05/01/2021 1627   PLT 303 05/01/2021 1627   MCV 78.1 (L) 05/01/2021 1627   MCH 26.2 (L) 05/01/2021 1627   MCHC  33.5 05/01/2021 1627   RDW 16.0 (H) 05/01/2021 1627   LYMPHSABS 2,135 05/01/2021 1627   MONOABS 344 04/18/2017 1431   EOSABS 0 (L) 05/01/2021 1627   BASOSABS 68 05/01/2021 1627    CMP     Component Value Date/Time   NA 140 05/01/2021 1627   K 3.9 05/01/2021 1627   CL 107 05/01/2021 1627   CO2 23 05/01/2021 1627   GLUCOSE 171 (H) 05/01/2021 1627   BUN 22 05/01/2021 1627   CREATININE 0.95 05/01/2021 1627   CALCIUM 9.4 05/01/2021 1627   PROT 6.6 05/01/2021 1627   ALBUMIN 4.5 04/18/2017 1431   AST 17 05/01/2021 1627   ALT 30 05/01/2021 1627   ALKPHOS 43 04/18/2017 1431   BILITOT 0.5 05/01/2021 1627   GFRNONAA 94 05/01/2021 1627   GFRAA 108 05/01/2021 1627    Assessment: 1.  Anal fissure: Posterior at time of exam, tender and bleeding 2.  Rectal pain 3.  Rectal bleeding  Plan: 1.  Prescribed Nitroglycerin ointment to be applied 3 times daily x6-8 weeks 2.  Encourage patient to continue sitz bath's 2-3 times a day for 15 to 20 minutes 3.  Recommend buying over-the-counter RectiCare cream with Lidocaine and applying as needed for pain 4.  Encouraged the patient to keep his stools soft and avoid straining. 5.  Patient to follow in clinic with me in 2 months.  At that time hopefully his symptoms are better and we can arrange him for his screening colonoscopy.  He was assigned to Dr. Bryan Lemma today.  Ellouise Newer,  PA-C [facility] 05/09/2021, 1:24 PM  Cc: Unk Pinto, MD

## 2021-05-12 NOTE — Progress Notes (Signed)
Agree with the assessment and plan as outlined by Jennifer Lemmon, PA-C. ? ?Charae Depaolis, DO, FACG ? ?

## 2021-06-28 ENCOUNTER — Other Ambulatory Visit: Payer: Self-pay | Admitting: Internal Medicine

## 2021-07-03 ENCOUNTER — Encounter: Payer: Self-pay | Admitting: Physician Assistant

## 2021-07-03 ENCOUNTER — Ambulatory Visit: Payer: 59 | Admitting: Physician Assistant

## 2021-07-03 VITALS — BP 142/82 | HR 107 | Ht 69.0 in | Wt 262.0 lb

## 2021-07-03 DIAGNOSIS — Z1211 Encounter for screening for malignant neoplasm of colon: Secondary | ICD-10-CM

## 2021-07-03 DIAGNOSIS — Z1212 Encounter for screening for malignant neoplasm of rectum: Secondary | ICD-10-CM

## 2021-07-03 DIAGNOSIS — K625 Hemorrhage of anus and rectum: Secondary | ICD-10-CM

## 2021-07-03 DIAGNOSIS — K602 Anal fissure, unspecified: Secondary | ICD-10-CM | POA: Diagnosis not present

## 2021-07-03 MED ORDER — SUPREP BOWEL PREP KIT 17.5-3.13-1.6 GM/177ML PO SOLN: ORAL | 0 refills | Status: DC

## 2021-07-03 NOTE — Patient Instructions (Addendum)
It was my pleasure to provide care to you today. Based on our discussion, I am providing you with my recommendations below:  RECOMMENDATION(S):   You have been scheduled for a colonoscopy. Please follow written instructions given to you at your visit today.   PREP:   Please pick up your prep supplies at the pharmacy within the next 1-3 days.  INHALERS:   If you use inhalers (even only as needed), please bring them with you on the day of your procedure.  MEDICATIONS TO HOLD:  Please refer to your Prep Instruction regarding holding ADIPEX  COLONOSCOPY TIPS:  To reduce nausea and dehydration, stay well hydrated for 3-4 days prior to the exam.  To prevent skin/hemorrhoid irritation - prior to wiping, put A&Dointment or vaseline on the toilet paper. Keep a towel or pad on the bed.  BEFORE STARTING YOUR PREP, drink  64oz of clear liquids in the morning. This will help to flush the colon and will ensure you are well hydrated!!!!  NOTE - This is in addition to the fluids required for to complete your prep. Use of a flavored hard candy, such as grape Anise Salvo, can counteract some of the flavor of the prep and may prevent some nausea.   FOLLOW UP:  After your procedure, you will receive a call from my office staff regarding my recommendation for follow up.  BMI:  If you are age 50 or younger, your body mass index should be between 19-25. Your There is no height or weight on file to calculate BMI. If this is out of the aformentioned range listed, please consider follow up with your Primary Care Provider.   MY CHART:  The [facility] providers would like to encourage you to use [facility] to communicate with providers for non-urgent requests or questions.  Due to long hold times on the telephone, sending your provider a message by [facility] may be a faster and more efficient way to get a response.  Please allow 48 business hours for a response.  Please remember that this is for non-urgent  requests.   Thank you for trusting me with your gastrointestinal care!    Ellouise Newer, [place]

## 2021-07-03 NOTE — Progress Notes (Signed)
Chief Complaint: Follow-up anal fissure  HPI:    Mike Cohen is a 50 year old AA male with a past medical history as listed below, assigned to Dr. Bryan Lemma, who returns to clinic today for follow-up of his anal fissure.    05/09/2021 patient seen in clinic with findings of a large/deep posterior fissure with active bleeding.  He was prescribed nitroglycerin ointment to be applied 3 times daily for 6 to 8 weeks, told to buy RectiCare with lidocaine and do sitz bath's.  Also encouraged to keep his stools soft and avoid straining.  Told to follow-up in 2 months, hoping that his symptoms be better and we could arrange him for screening colonoscopy.    Today, the patient tells me that his symptoms are much much better but he still have occasional bright red blood on the toilet paper and occasional discomfort but it is no longer uncomfortable him to just sit in a chair.  He has been using the Nitroglycerin for 2 months now.  He is not exactly sure about his actual stools and whether or not they are hard or if he is constipated but he tells me that he is not straining.    Denies fever, chills, weight loss or change in bowel habits.  Past Medical History:  Diagnosis Date   Hyperlipidemia    Hypertension    Other testicular hypofunction    Prediabetes     Past Surgical History:  Procedure Laterality Date   ROTATOR CUFF REPAIR Right 2007   SHOULDER ARTHROSCOPY Right 2000    Current Outpatient Medications  Medication Sig Dispense Refill   AMBULATORY NON FORMULARY MEDICATION Nitroglycerin ointment 0.125 % apply a pea size amount to your rectum three times daily x 6-8 weeks. 30 g 1   aspirin EC 81 MG tablet Take 81 mg by mouth daily.     Cholecalciferol (VITAMIN D PO) Take 2,000 Int'l Units by mouth daily.     hydrocortisone (ANUSOL-HC) 25 MG suppository Insert 1 rectal suppository 3 x /day 24 suppository 1   Hydrocortisone Acetate 1 % OINT Apply 3 to 4 x /day to Inflamed area 60 g 1    losartan-hydrochlorothiazide (HYZAAR) 50-12.5 MG tablet TAKE ONE TABLET BY MOUTH DAILY FOR BLOOD PRESSURE 90 tablet 1   minocycline (MINOCIN) 50 MG capsule Take  1 capsule  2 x /day  with Food  for Skin Infection 180 capsule 1   Multiple Vitamin (MULTIVITAMIN) tablet Take 1 tablet by mouth daily.     phentermine (ADIPEX-P) 37.5 MG tablet Take 1/2 to 1 tablet  every Morning  for Dieting & Weight Loss 90 tablet 1   sildenafil (VIAGRA) 100 MG tablet Take 1/2 to 1 tablet Daily as needed for XXXX 30 tablet 0   tadalafil (CIALIS) 20 MG tablet Take  1/2 to 1 tablet  every  2 to 3 days  as needed for XXXX 30 tablet 3   testosterone cypionate (DEPOTESTOSTERONE CYPIONATE) 200 MG/ML injection INJECT TWO MILLILITERS INTRAMUSCULARLY EVERY 2 WEEKS 4 mL 5   topiramate (TOPAMAX) 50 MG tablet Take  1 tablet  2 x /day  at Supper & Bedtime for Dieting & Weight Loss 180 tablet 3   No current facility-administered medications for this visit.    Allergies as of 07/03/2021   (No Known Allergies)    Family History  Problem Relation Age of Onset   Heart disease Father    Hyperlipidemia Father    Hypertension Father     Social History  Socioeconomic History   Marital status: Married    Spouse name: Not on file   Number of children: Not on file   Years of education: Not on file   Highest education level: Not on file  Occupational History   Not on file  Tobacco Use   Smoking status: Never   Smokeless tobacco: Never   Tobacco comments:    occasional cigar  Substance and Sexual Activity   Alcohol use: Yes    Alcohol/week: 0.0 standard drinks    Comment: occasional   Drug use: No   Sexual activity: Yes  Other Topics Concern   Not on file  Social History Narrative   Not on file   Social Determinants of Health   Financial Resource Strain: Not on file  Food Insecurity: Not on file  Transportation Needs: Not on file  Physical Activity: Not on file  Stress: Not on file  Social Connections: Not  on file  Intimate Partner Violence: Not on file    Review of Systems:    Constitutional: No weight loss, fever or chills Cardiovascular: No chest pain  Respiratory: No SOB  Gastrointestinal: See HPI and otherwise negative   Physical Exam:  Vital signs: BP (!) 142/82   Pulse (!) 107   Ht '5\' 9"'$  (1.753 m)   Wt 262 lb (118.8 kg)   BMI 38.69 kg/m   Constitutional:   Pleasant AA male appears to be in NAD, Well developed, Well nourished, alert and cooperative Respiratory: Respirations even and unlabored. Lungs clear to auscultation bilaterally.   No wheezes, crackles, or rhonchi.  Cardiovascular: Normal S1, S2. No MRG. Regular rate and rhythm. No peripheral edema, cyanosis or pallor.  Gastrointestinal:  Soft, nondistended, nontender. No rebound or guarding. Normal bowel sounds. No appreciable masses or hepatomegaly. Rectal:  Declined Psychiatric: Oriented to person, place and time. Demonstrates good judgement and reason without abnormal affect or behaviors.  RELEVANT LABS AND IMAGING: CBC    Component Value Date/Time   WBC 13.6 (H) 05/01/2021 1627   RBC 5.20 05/01/2021 1627   HGB 13.6 05/01/2021 1627   HCT 40.6 05/01/2021 1627   PLT 303 05/01/2021 1627   MCV 78.1 (L) 05/01/2021 1627   MCH 26.2 (L) 05/01/2021 1627   MCHC 33.5 05/01/2021 1627   RDW 16.0 (H) 05/01/2021 1627   LYMPHSABS 2,135 05/01/2021 1627   MONOABS 344 04/18/2017 1431   EOSABS 0 (L) 05/01/2021 1627   BASOSABS 68 05/01/2021 1627    CMP     Component Value Date/Time   NA 140 05/01/2021 1627   K 3.9 05/01/2021 1627   CL 107 05/01/2021 1627   CO2 23 05/01/2021 1627   GLUCOSE 171 (H) 05/01/2021 1627   BUN 22 05/01/2021 1627   CREATININE 0.95 05/01/2021 1627   CALCIUM 9.4 05/01/2021 1627   PROT 6.6 05/01/2021 1627   ALBUMIN 4.5 04/18/2017 1431   AST 17 05/01/2021 1627   ALT 30 05/01/2021 1627   ALKPHOS 43 04/18/2017 1431   BILITOT 0.5 05/01/2021 1627   GFRNONAA 94 05/01/2021 1627   GFRAA 108 05/01/2021  1627    Assessment: 1.  Anal fissure: With bleeding and pain, 80% better after 2 months of Nitroglycerin treatment but symptoms are not gone 2.  Rectal bleeding: From above, but does continue even after 8 weeks of Nitroglycerin treatment 3.  Screening for colorectal cancer: Patient is 67 and never had screening for colorectal cancer  Plan: 1.  Patient has some ongoing symptoms of rectal bleeding  and rectal discomfort but it is much better than when he was seen 2 months ago.  He declined a repeat rectal exam today.  Recommend he continue his Nitroglycerin ointment 3 times daily for another month as well as other treatments for anal fissure as discussed at last visit. 2.  We will go ahead and schedule the patient for his screening colonoscopy.  He requested that this be in October given that he is currently living with his parents and the bathroom space is limited.  He would like to wait until he has moved into his new home.  He was scheduled in October.  Provided the patient a detailed list of risks for his procedure and he agrees to proceed. 3.  Patient to follow in clinic per recommendations from Dr. Bryan Lemma after time of procedure  Ellouise Newer, PA-C [facility] 07/03/2021, 2:04 PM  Cc: Unk Pinto, MD

## 2021-07-03 NOTE — Progress Notes (Signed)
Agree with the assessment and plan as outlined by Jennifer Lemmon, PA-C. ? ?Carmon Brigandi, DO, FACG ? ?

## 2021-07-25 ENCOUNTER — Other Ambulatory Visit: Payer: Self-pay | Admitting: Internal Medicine

## 2021-07-25 DIAGNOSIS — Z6836 Body mass index (BMI) 36.0-36.9, adult: Secondary | ICD-10-CM

## 2021-07-25 DIAGNOSIS — E782 Mixed hyperlipidemia: Secondary | ICD-10-CM

## 2021-08-07 ENCOUNTER — Ambulatory Visit (INDEPENDENT_AMBULATORY_CARE_PROVIDER_SITE_OTHER): Payer: 59 | Admitting: Internal Medicine

## 2021-08-07 ENCOUNTER — Other Ambulatory Visit: Payer: Self-pay

## 2021-08-07 ENCOUNTER — Encounter: Payer: Self-pay | Admitting: Internal Medicine

## 2021-08-07 VITALS — BP 140/86 | HR 98 | Temp 97.1°F | Resp 16 | Ht 69.0 in | Wt 266.8 lb

## 2021-08-07 DIAGNOSIS — E1122 Type 2 diabetes mellitus with diabetic chronic kidney disease: Secondary | ICD-10-CM

## 2021-08-07 DIAGNOSIS — Z79899 Other long term (current) drug therapy: Secondary | ICD-10-CM

## 2021-08-07 DIAGNOSIS — N182 Chronic kidney disease, stage 2 (mild): Secondary | ICD-10-CM

## 2021-08-07 DIAGNOSIS — I10 Essential (primary) hypertension: Secondary | ICD-10-CM | POA: Diagnosis not present

## 2021-08-07 DIAGNOSIS — E1169 Type 2 diabetes mellitus with other specified complication: Secondary | ICD-10-CM

## 2021-08-07 DIAGNOSIS — E785 Hyperlipidemia, unspecified: Secondary | ICD-10-CM

## 2021-08-07 DIAGNOSIS — Z6839 Body mass index (BMI) 39.0-39.9, adult: Secondary | ICD-10-CM

## 2021-08-07 DIAGNOSIS — E559 Vitamin D deficiency, unspecified: Secondary | ICD-10-CM

## 2021-08-07 NOTE — Progress Notes (Addendum)
Future Appointments  Date Time Provider [place]  08/07/2021  2:30 PM Unk Pinto, MD [facility] None  10/02/2021  9:30 AM Lavena Bullion, DO [facility]  01/30/2022 10:00 AM Unk Pinto, MD [facility] None    History of Present Illness:       This very nice 50 y.o. MBM presents for  6 month follow up with HTN, HLD, T2_NIDDM and Vitamin D Deficiency.        Patient is treated for HTN & BP has been controlled at home. Today's BP was initially slightly elevated & rechecked at goal - 140/86. Patient has had no complaints of any cardiac type chest pain, palpitations, dyspnea / orthopnea / PND, dizziness, claudication, or dependent edema.       Hyperlipidemia is controlled with diet & meds. Patient denies myalgias or other med SE's. Last Lipids were at goal:  Lab Results  Component Value Date   CHOL 132 05/01/2021   HDL 39 (L) 05/01/2021   LDLCALC 71 05/01/2021   TRIG 134 05/01/2021   CHOLHDL 3.4 05/01/2021     Also, the patient has history of  T2_NIDDM (A1c 6.5% /2012)  and last A1c 6.7%  May  2022  was still not at goal. Patient as had no symptoms of reactive hypoglycemia, diabetic polys, paresthesias or visual blurring.  Last A1c was not at goal:  Lab Results  Component Value Date   HGBA1C 6.7 (H) 05/01/2021                                                          Further, the patient also has history of Vitamin D Deficiency and supplements vitamin D without any suspected side-effects. Last vitamin D was at goal:  Lab Results  Component Value Date   VD25OH 61 05/01/2021     Current Outpatient Medications on File Prior to Visit  Medication Sig   AMBULATORY NON FORMULARY MEDICATION Nitroglycerin ointment 0.125 % apply a pea size amount to your rectum three times daily x 6-8 weeks.   aspirin EC 81 MG tablet Take daily.   VITAMIN D   2,000 Units Take  daily.   losartan-hctz 50-12.5 MG tablet TAKE ONE TABLET DAILY    Metformin 1,000 mg  tablet  Take 1 tablet 2 x /day with meals for Diabetes   minocycline 50 MG  Take  1 capsule  2 x /day  with Food  for Skin Infection   Multiple Vitamin  Take 1 tablet daily.   phentermine 37.5 MG tablet Take  1/2 to 1 tablet  every Morning    rosuvastatin 40 MG tablet Take 1 tablet  Daily    tadalafil  20 MG tablet Take  1/2 to 1 tablet  every  2 to 3 days  as needed for XXXX   testosterone cypio 200 MG/ML injec INJECT 2.0 ML IM EVERY 2 WEEKS   topiramate  50 MG tablet Take  1 tablet  2 x /day  at Supper & Bedtime     No Known Allergies   PMHx:   Past Medical History:  Diagnosis Date   Hyperlipidemia    Hypertension    Other testicular hypofunction    Prediabetes      Immunization History  Administered Date(s) Administered   Influenza Inj Mdck Quad With Preservative  11/04/2017   Influenza Split 09/22/2014, 09/19/2015, 09/12/2016   Influenza-Unspecified 09/09/2018, 10/21/2019   PFIZER(Purple Top)SARS-COV-2 Vaccination 11/25/2020   PPD Test 09/19/2015, 10/17/2016, 11/13/2017, 12/17/2018, 01/05/2020, 01/19/2021   Td 10/13/2002   Tdap 09/19/2015     Past Surgical History:  Procedure Laterality Date   ROTATOR CUFF REPAIR Right 2007   SHOULDER ARTHROSCOPY Right 2000    FHx:    Reviewed / unchanged  SHx:    Reviewed / unchanged   Systems Review:  Constitutional: Denies fever, chills, wt changes, headaches, insomnia, fatigue, night sweats, change in appetite. Eyes: Denies redness, blurred vision, diplopia, discharge, itchy, watery eyes.  ENT: Denies discharge, congestion, post nasal drip, epistaxis, sore throat, earache, hearing loss, dental pain, tinnitus, vertigo, sinus pain, snoring.  CV: Denies chest pain, palpitations, irregular heartbeat, syncope, dyspnea, diaphoresis, orthopnea, PND, claudication or edema. Respiratory: denies cough, dyspnea, DOE, pleurisy, hoarseness, laryngitis, wheezing.  Gastrointestinal: Denies dysphagia, odynophagia, heartburn, reflux, water  brash, abdominal pain or cramps, nausea, vomiting, bloating, diarrhea, constipation, hematemesis, melena, hematochezia  or hemorrhoids. Genitourinary: Denies dysuria, frequency, urgency, nocturia, hesitancy, discharge, hematuria or flank pain. Musculoskeletal: Denies arthralgias, myalgias, stiffness, jt. swelling, pain, limping or strain/sprain.  Skin: Denies pruritus, rash, hives, warts, acne, eczema or change in skin lesion(s). Neuro: No weakness, tremor, incoordination, spasms, paresthesia or pain. Psychiatric: Denies confusion, memory loss or sensory loss. Endo: Denies change in weight, skin or hair change.  Heme/Lymph: No excessive bleeding, bruising or enlarged lymph nodes.  Physical Exam  BP 140/86   Pulse 98   Temp (!) 97.1 F (36.2 C)   Resp 16   Ht '5\' 9"'$  (1.753 m)   Wt 266 lb 12.8 oz (121 kg)   SpO2 97%   BMI 39.40 kg/m   Appears  over nourished, well groomed  and in no distress.  Eyes: PERRLA, EOMs, conjunctiva no swelling or erythema. Sinuses: No frontal/maxillary tenderness ENT/Mouth: EAC's clear, TM's nl w/o erythema, bulging. Nares clear w/o erythema, swelling, exudates. Oropharynx clear without erythema or exudates. Oral hygiene is good. Tongue normal, non obstructing. Hearing intact.  Neck: Supple. Thyroid not palpable. Car 2+/2+ without bruits, nodes or JVD. Chest: Respirations nl with BS clear & equal w/o rales, rhonchi, wheezing or stridor.  Cor: Heart sounds normal w/ regular rate and rhythm without sig. murmurs, gallops, clicks or rubs. Peripheral pulses normal and equal  without edema.  Abdomen: Soft & bowel sounds normal. Non-tender w/o guarding, rebound, hernias, masses or organomegaly.  Lymphatics: Unremarkable.  Musculoskeletal: Full ROM all peripheral extremities, joint stability, 5/5 strength and normal gait.  Skin: Warm, dry without exposed rashes, lesions or ecchymosis apparent.  Neuro: Cranial nerves intact, reflexes equal bilaterally. Sensory-motor  testing grossly intact. Tendon reflexes grossly intact.  Pysch: Alert & oriented x 3.  Insight and judgement nl & appropriate. No ideations.  Assessment and Plan:   1. Essential hypertension  - Continue medication, monitor blood pressure at home.  - Continue DASH diet.  Reminder to go to the ER if any CP,  SOB, nausea, dizziness, severe HA, changes vision/speech.   - CBC with Differential/Platelet - COMPLETE METABOLIC PANEL WITH GFR - Magnesium - TSH  2. Class 2 severe obesity due to excess calories with serious comorbidity  and body mass index (BMI) of 39.0 to 39.9 in adult ([place])   3. Hyperlipidemia associated with type 2 diabetes mellitus ([place])  - Continue diet/meds, exercise,& lifestyle modifications.  - Continue monitor periodic cholesterol/liver & renal functions   - Lipid panel - TSH  4. Type 2 diabetes mellitus with stage 2 chronic kidney  disease, without long-term current use of insulin ([facility])   - Continue diet, exercise  - Lifestyle modifications.  - Monitor appropriate labs    - Pending A1c results, consider starting Ozempic  - Hemoglobin A1c - Insulin, random  5. Vitamin D deficiency   - Continue supplementation.    6. Medication management  - CBC with Differential/Platelet - COMPLETE METABOLIC PANEL WITH GFR - Magnesium - Lipid panel - TSH - Hemoglobin A1c - Insulin, random        Discussed  regular exercise, BP monitoring, weight control to achieve/maintain BMI less than 25 and discussed med and SE's. Recommended labs to assess and monitor clinical status with further disposition pending results of labs.  I discussed the assessment and treatment plan with the patient. The patient was provided an opportunity to ask questions and all were answered. The patient agreed with the plan and demonstrated an understanding of the instructions.  I provided over 30 minutes of exam, counseling, chart review and  complex critical decision making.        The  patient was advised to call back or seek an in-person evaluation if the symptoms worsen or if the condition fails to improve as anticipated.   Kirtland Bouchard, MD

## 2021-08-07 NOTE — Patient Instructions (Signed)

## 2021-08-08 LAB — CBC WITH DIFFERENTIAL/PLATELET
Absolute Monocytes: 394 cells/uL (ref 200–950)
Basophils Absolute: 19 cells/uL (ref 0–200)
Basophils Relative: 0.4 %
Eosinophils Absolute: 163 cells/uL (ref 15–500)
Eosinophils Relative: 3.4 %
HCT: 42.5 % (ref 38.5–50.0)
Hemoglobin: 14.1 g/dL (ref 13.2–17.1)
Lymphs Abs: 1819 cells/uL (ref 850–3900)
MCH: 26.9 pg — ABNORMAL LOW (ref 27.0–33.0)
MCHC: 33.2 g/dL (ref 32.0–36.0)
MCV: 81 fL (ref 80.0–100.0)
MPV: 9.8 fL (ref 7.5–12.5)
Monocytes Relative: 8.2 %
Neutro Abs: 2405 cells/uL (ref 1500–7800)
Neutrophils Relative %: 50.1 %
Platelets: 247 10*3/uL (ref 140–400)
RBC: 5.25 10*6/uL (ref 4.20–5.80)
RDW: 13.8 % (ref 11.0–15.0)
Total Lymphocyte: 37.9 %
WBC: 4.8 10*3/uL (ref 3.8–10.8)

## 2021-08-08 LAB — COMPLETE METABOLIC PANEL WITH GFR
AG Ratio: 2 (calc) (ref 1.0–2.5)
ALT: 41 U/L (ref 9–46)
AST: 32 U/L (ref 10–40)
Albumin: 4.5 g/dL (ref 3.6–5.1)
Alkaline phosphatase (APISO): 57 U/L (ref 36–130)
BUN: 12 mg/dL (ref 7–25)
CO2: 21 mmol/L (ref 20–32)
Calcium: 9.6 mg/dL (ref 8.6–10.3)
Chloride: 109 mmol/L (ref 98–110)
Creat: 1.15 mg/dL (ref 0.60–1.29)
Globulin: 2.3 g/dL (calc) (ref 1.9–3.7)
Glucose, Bld: 124 mg/dL — ABNORMAL HIGH (ref 65–99)
Potassium: 3.9 mmol/L (ref 3.5–5.3)
Sodium: 140 mmol/L (ref 135–146)
Total Bilirubin: 0.3 mg/dL (ref 0.2–1.2)
Total Protein: 6.8 g/dL (ref 6.1–8.1)
eGFR: 78 mL/min/{1.73_m2} (ref >=60)

## 2021-08-08 LAB — LIPID PANEL
Cholesterol: 126 mg/dL (ref <200)
HDL: 23 mg/dL — ABNORMAL LOW (ref >=40)
LDL Cholesterol (Calc): 68 mg/dL (calc)
Non-HDL Cholesterol (Calc): 103 mg/dL (calc) (ref <130)
Total CHOL/HDL Ratio: 5.5 (calc) — ABNORMAL HIGH (ref <5.0)
Triglycerides: 265 mg/dL — ABNORMAL HIGH (ref <150)

## 2021-08-08 LAB — MAGNESIUM: Magnesium: 2 mg/dL (ref 1.5–2.5)

## 2021-08-08 LAB — HEMOGLOBIN A1C
Hgb A1c MFr Bld: 6.2 % of total Hgb — ABNORMAL HIGH (ref <5.7)
Mean Plasma Glucose: 131 mg/dL
eAG (mmol/L): 7.3 mmol/L

## 2021-08-08 LAB — TSH: TSH: 0.66 mIU/L (ref 0.40–4.50)

## 2021-08-08 LAB — INSULIN, RANDOM: Insulin: 132 u[IU]/mL — ABNORMAL HIGH

## 2021-08-08 NOTE — Progress Notes (Signed)
============================================================ -   Test results slightly outside the reference range are not unusual. If there is anything important, I will review this with you,  otherwise it is considered normal test values.  If you have further questions,  please do not hesitate to contact me at the office or via My Chart.  ============================================================ ============================================================  -  Total Chol = 126   &    LDL Chol = 68    - Both  Excellent   - Very low risk for Heart Attack  / Stroke ============================================================ ============================================================  -  But  Triglycerides (   265   ) or fats in blood are too high                     (goal is less than 150)    - Recommend avoid fried & greasy foods,  sweets / candy,   - Avoid white rice  (brown or wild rice or Quinoa is OK),   - Avoid white potatoes  (sweet potatoes are OK)   - Avoid anything made from white flour  - bagels, doughnuts, rolls, buns, biscuits, white and   wheat breads, pizza crust and traditional  pasta made of white flour & egg white  - (vegetarian pasta or spinach or wheat pasta is OK).    - Multi-grain bread is OK - like multi-grain flat bread or  sandwich thins.   - Avoid alcohol in excess.   - Exercise is also important. ============================================================ ============================================================  -  Thyroid Normal  ============================================================ ============================================================  -  A1c better - Down from 6.7% to now 6.2%                 (  Ideal or Goal is less than 5.7%  !  )  ============================================================ ============================================================  -  But . . .   Insulin level is very elevated  at 132                          ( Ideal or Normal Insulin is less than 20 !  )   - & elevated Insulin level shows insulin resistance - a sign of early diabetes and   associated with a 300 % greater risk for heart attacks, strokes, cancer &   Alzheimer type vascular dementia   - All this can be cured  and prevented with losing weight   - get Dr Fara Olden Fuhrman's book 'the End of Diabetes" and "the End of Dieting" &   add many years of good health to your life. ============================================================ ============================================================  -  All Else - CBC - Kidneys - Electrolytes - Liver - Magnesium & Thyroid    - all  Normal / OK ============================================================  Adding Ozempic as discussed should help with weight loss as doe is increased.   - Starting with 0.25 mg weekly for next 4 weeks,  then   increasing to 0.5 mg weekly for 2 more weeks will delete your sample and then   call for refill to be sent in to continue 2 more weeks of 0.5 mg /week Ozempic   Then schedule is to increase to Ozempic 1 mg /week x 4 weeks &   Then finally up to 2 mg / week is maintenance dose ! ============================================================ ============================================================

## 2021-09-11 ENCOUNTER — Other Ambulatory Visit: Payer: Self-pay | Admitting: Internal Medicine

## 2021-09-11 DIAGNOSIS — E1122 Type 2 diabetes mellitus with diabetic chronic kidney disease: Secondary | ICD-10-CM

## 2021-09-11 DIAGNOSIS — N182 Chronic kidney disease, stage 2 (mild): Secondary | ICD-10-CM

## 2021-09-11 MED ORDER — OZEMPIC (0.25 OR 0.5 MG/DOSE) 2 MG/1.5ML ~~LOC~~ SOPN: 0.5000 mg | PEN_INJECTOR | SUBCUTANEOUS | 0 refills | Status: DC

## 2021-09-26 ENCOUNTER — Other Ambulatory Visit: Payer: Self-pay | Admitting: Internal Medicine

## 2021-09-26 MED ORDER — MOUNJARO 12.5 MG/0.5ML ~~LOC~~ SOAJ: 12.5000 mg | SUBCUTANEOUS | 0 refills | Status: DC

## 2021-10-02 ENCOUNTER — Encounter: Payer: 59 | Admitting: Gastroenterology

## 2021-10-13 ENCOUNTER — Encounter: Payer: Self-pay | Admitting: Gastroenterology

## 2021-10-23 ENCOUNTER — Telehealth: Payer: Self-pay | Admitting: Gastroenterology

## 2021-10-23 NOTE — Telephone Encounter (Signed)
Its fine to proceed, advise patient to drink additional fluid 16 -32oz to help get better clean out and follow all the instruction for prep

## 2021-10-23 NOTE — Telephone Encounter (Signed)
Returned pt's call. There was no answer, but left a detailed voicemail stating recommendations from MD. Instructed pt to call back if any further questions.

## 2021-10-23 NOTE — Telephone Encounter (Signed)
Dr. Silverio Decamp, this is Dr. Vivia Ewing pt but he is off today. Please advise.

## 2021-10-23 NOTE — Telephone Encounter (Signed)
Patient called.  He has colonoscopy tomorrow, but he ate popcorn on Friday or Saturday.  Wants to make sure he can still do procedure or see if he would need to reschedule.  Please call and advise.  Thank you.

## 2021-10-24 ENCOUNTER — Telehealth: Payer: Self-pay | Admitting: General Surgery

## 2021-10-24 ENCOUNTER — Ambulatory Visit (AMBULATORY_SURGERY_CENTER): Payer: 59 | Admitting: Gastroenterology

## 2021-10-24 ENCOUNTER — Encounter: Payer: Self-pay | Admitting: Gastroenterology

## 2021-10-24 ENCOUNTER — Other Ambulatory Visit: Payer: Self-pay

## 2021-10-24 VITALS — BP 129/96 | HR 90 | Temp 98.0°F | Resp 14 | Ht 69.0 in | Wt 262.0 lb

## 2021-10-24 DIAGNOSIS — K635 Polyp of colon: Secondary | ICD-10-CM

## 2021-10-24 DIAGNOSIS — Z1211 Encounter for screening for malignant neoplasm of colon: Secondary | ICD-10-CM | POA: Diagnosis present

## 2021-10-24 DIAGNOSIS — K602 Anal fissure, unspecified: Secondary | ICD-10-CM

## 2021-10-24 DIAGNOSIS — D128 Benign neoplasm of rectum: Secondary | ICD-10-CM

## 2021-10-24 DIAGNOSIS — K5730 Diverticulosis of large intestine without perforation or abscess without bleeding: Secondary | ICD-10-CM

## 2021-10-24 DIAGNOSIS — D122 Benign neoplasm of ascending colon: Secondary | ICD-10-CM

## 2021-10-24 DIAGNOSIS — K640 First degree hemorrhoids: Secondary | ICD-10-CM

## 2021-10-24 DIAGNOSIS — K621 Rectal polyp: Secondary | ICD-10-CM

## 2021-10-24 MED ORDER — AMBULATORY NON FORMULARY MEDICATION: 1 refills | Status: DC

## 2021-10-24 MED ORDER — SODIUM CHLORIDE 0.9 % IV SOLN: 500.0000 mL | Freq: Once | INTRAVENOUS | Status: DC

## 2021-10-24 NOTE — Telephone Encounter (Signed)
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  Can you please send in the following:   - Topical nifedipine 0.2% applied 2-4 times daily. Caution to avoid strenuous activity within 30 minutes of application  - Start or continue fiber supplement for a goal of regular, soft, stools without straining to have a bowel movement. If unable to achieve with fiber alone, or if sxs are exacerbated with fiber, can consider starting oral laxative agent (ie, Miralax) to improve stool consistency and aid in fissure healing  - Sitz bath with warm water for 10-15 minutes 2-3 times daily; directed to [employer] available online or at [employer]; ensure to dry area afterwards

## 2021-10-24 NOTE — Progress Notes (Signed)
Called to room to assist during endoscopic procedure.  Patient ID and intended procedure confirmed with present staff. Received instructions for my participation in the procedure from the performing physician.  

## 2021-10-24 NOTE — Progress Notes (Signed)
Vitals-CW  Pt's states no medical or surgical changes since previsit or office visit.   History reviewed.

## 2021-10-24 NOTE — Progress Notes (Signed)
PT taken to PACU. Monitors in place. VSS. Report given to RN. 

## 2021-10-24 NOTE — Op Note (Signed)
[name]: Mike Cohen Procedure Date: 10/24/2021 9:08 AM MRN: [id number] Endoscopist: Gerrit Heck , MD Age: 50 Referring MD:  Date of Birth: 23-Oct-1971 Gender: Male Account #: [id number] Procedure:                Colonoscopy Indications:              Screening for colorectal malignant neoplasm, This                            is the patient's first colonoscopy                           He does have a history of anal fissure, treated                            with topical NTG, sitz baths, and stool softeners                            with clinical improvement. Medicines:                Monitored Anesthesia Care Procedure:                Pre-Anesthesia Assessment:                           - Prior to the procedure, a History and Physical                            was performed, and patient medications and                            allergies were reviewed. The patient's tolerance of                            previous anesthesia was also reviewed. The risks                            and benefits of the procedure and the sedation                            options and risks were discussed with the patient.                            All questions were answered, and informed consent                            was obtained. Prior Anticoagulants: The patient has                            taken no previous anticoagulant or antiplatelet                            agents except for aspirin. ASA Grade Assessment: II                            -  A patient with mild systemic disease. After                            reviewing the risks and benefits, the patient was                            deemed in satisfactory condition to undergo the                            procedure.                           After obtaining informed consent, the colonoscope                            was passed under direct vision. Throughout the                             procedure, the patient's blood pressure, pulse, and                            oxygen saturations were monitored continuously. The                            CF [id number] #[id number] was introduced through the anus                            and advanced to the the terminal ileum. The                            colonoscopy was performed without difficulty. The                            patient tolerated the procedure well. The quality                            of the bowel preparation was good. The terminal                            ileum, ileocecal valve, appendiceal orifice, and                            rectum were photographed. Scope In: 9:15:46 AM Scope Out: 9:39:05 AM Scope Withdrawal Time: 0 hours 16 minutes 14 seconds  Total Procedure Duration: 0 hours 23 minutes 19 seconds  Findings:                 The perianal exam findings includes a small anal                            fissure and skin tags.                           A 5 mm polyp was found in the ascending colon. The  polyp was sessile. The polyp was removed with a                            cold snare. Resection and retrieval were complete.                            Estimated blood loss was minimal.                           A 3 mm polyp was found in the rectum. The polyp was                            sessile. The polyp was removed with a cold snare.                            Resection and retrieval were complete. Estimated                            blood loss was minimal.                           Multiple small and large-mouthed diverticula were                            found in the sigmoid colon, transverse colon and                            ascending colon.                           Non-bleeding internal hemorrhoids were found during                            retroflexion. The hemorrhoids were small.                           The terminal ileum appeared normal. Complications:             No immediate complications. Estimated Blood Loss:     Estimated blood loss was minimal. Impression:               - Anal fissure and skin tags found on perianal exam.                           - One 5 mm polyp in the ascending colon, removed                            with a cold snare. Resected and retrieved.                           - One 3 mm polyp in the rectum, removed with a cold                            snare. Resected and retrieved.                           -  Diverticulosis in the sigmoid colon, in the                            transverse colon and in the ascending colon.                           - Non-bleeding internal hemorrhoids.                           - The examined portion of the ileum was normal. Recommendation:           - Patient has a contact number available for                            emergencies. The signs and symptoms of potential                            delayed complications were discussed with the                            patient. Return to normal activities tomorrow.                            Written discharge instructions were provided to the                            patient.                           - Resume previous diet.                           - Continue present medications.                           - Await pathology results.                           - Repeat colonoscopy for surveillance based on                            pathology results.                           - Continue Sitz baths. If symptomatic recurrence of                            fissure, can also trial course of topical CCB.                           - Use fiber, for example Citrucel, Fibercon, Konsyl                            or Metamucil. Gerrit Heck, MD 10/24/2021 9:47:56 AM

## 2021-10-24 NOTE — Patient Instructions (Addendum)
Thank you for allowing [place] to take care of your healthcare needs.  Continue sitz baths.  If recurrence of fissure may try topical calcium channel blockers. Use fiber example Fibercon, Metamucil, Konsyl, [place].  Please review the handouts on polyps and hemorrhoids.  YOU HAD AN ENDOSCOPIC PROCEDURE TODAY AT [facility]:   Refer to the procedure report that was given to you for any specific questions about what was found during the examination.  If the procedure report does not answer your questions, please call your gastroenterologist to clarify.  If you requested that your care partner not be given the details of your procedure findings, then the procedure report has been included in a sealed envelope for you to review at your convenience later.  YOU SHOULD EXPECT: Some feelings of bloating in the abdomen. Passage of more gas than usual.  Walking can help get rid of the air that was put into your GI tract during the procedure and reduce the bloating. If you had a lower endoscopy (such as a colonoscopy or flexible sigmoidoscopy) you may notice spotting of blood in your stool or on the toilet paper. If you underwent a bowel prep for your procedure, you may not have a normal bowel movement for a few days.  Please Note:  You might notice some irritation and congestion in your nose or some drainage.  This is from the oxygen used during your procedure.  There is no need for concern and it should clear up in a day or so.  SYMPTOMS TO REPORT IMMEDIATELY:  Following lower endoscopy (colonoscopy or flexible sigmoidoscopy):  Excessive amounts of blood in the stool  Significant tenderness or worsening of abdominal pains  Swelling of the abdomen that is new, acute  Fever of 100F or higher  For urgent or emergent issues, a gastroenterologist can be reached at any hour by calling [phone]. Do not use MyChart messaging for urgent concerns.    DIET:  We do recommend a small meal at first,  but then you may proceed to your regular diet.  Drink plenty of fluids but you should avoid alcoholic beverages for 24 hours.  ACTIVITY:  You should plan to take it easy for the rest of today and you should NOT DRIVE or use heavy machinery until tomorrow (because of the sedation medicines used during the test).    FOLLOW UP: Our staff will call the number listed on your records 48-72 hours following your procedure to check on you and address any questions or concerns that you may have regarding the information given to you following your procedure. If we do not reach you, we will leave a message.  We will attempt to reach you two times.  During this call, we will ask if you have developed any symptoms of COVID 19. If you develop any symptoms (ie: fever, flu-like symptoms, shortness of breath, cough etc.) before then, please call [phone].  If you test positive for Covid 19 in the 2 weeks post procedure, please call and report this information to [place].    If any biopsies were taken you will be contacted by phone or by letter within the next 1-3 weeks.  Please call us at [phone] if you have not heard about the biopsies in 3 weeks.    SIGNATURES/CONFIDENTIALITY: You and/or your care partner have signed paperwork which will be entered into your electronic medical record.  These signatures attest to the fact that that the information above on your After  Visit Summary has been reviewed and is understood.  Full responsibility of the confidentiality of this discharge information lies with you and/or your care-partner.

## 2021-10-24 NOTE — Telephone Encounter (Signed)
Contacted the patient and discussed where to send his new medication. He asked if we would send it to [facility]. Pt also informed of fiber supplement and if it exacerbates the problem he might try miralax. Patient also informed to use a sitz bath, he states he has been doing this for some time.

## 2021-10-24 NOTE — Progress Notes (Signed)
GASTROENTEROLOGY PROCEDURE H&P NOTE   Primary Care Physician: Unk Pinto, MD    Reason for Procedure:  History of anal fissure, colon cancer screening, hematochezia  Plan:    Colonoscopy  Patient is appropriate for endoscopic procedure(s) in the ambulatory ([place]) setting.  The nature of the procedure, as well as the risks, benefits, and alternatives were carefully and thoroughly reviewed with the patient. Ample time for discussion and questions allowed. The patient understood, was satisfied, and agreed to proceed.     HPI: Mike Cohen is a 50 y.o. male who presents for colonoscopy for initial colon cancer screening.  Does have a history of anal fissure and hematochezia, essentially resolved with NTG topical x2 months, sitz bath's, and stool softener.  No previous colonoscopy.  No known family history of colon cancer.  Past Medical History:  Diagnosis Date   Diabetes mellitus without complication ([place])    Hyperlipidemia    Hypertension    Other testicular hypofunction    Prediabetes    Sleep apnea     Past Surgical History:  Procedure Laterality Date   ROTATOR CUFF REPAIR Right 2007   SHOULDER ARTHROSCOPY Right 2000    Prior to Admission medications   Medication Sig Start Date End Date Taking? Authorizing Provider  AMBULATORY NON FORMULARY MEDICATION Nitroglycerin ointment 0.125 % apply a pea size amount to your rectum three times daily x 6-8 weeks. 05/09/21   Levin Erp, PA  aspirin EC 81 MG tablet Take 81 mg by mouth daily.    [provider]  Cholecalciferol (VITAMIN D PO) Take 2,000 Int'l Units by mouth daily.    [provider]  losartan-hydrochlorothiazide (HYZAAR) 50-12.5 MG tablet TAKE ONE TABLET BY MOUTH DAILY FOR BLOOD PRESSURE 04/22/21   Liane Comber, NP  minocycline (MINOCIN) 50 MG capsule Take  1 capsule  2 x /day  with Food  for Skin Infection 01/19/21   Unk Pinto, MD  Multiple Vitamin (MULTIVITAMIN) tablet  Take 1 tablet by mouth daily.    [provider]  phentermine (ADIPEX-P) 37.5 MG tablet Take  1/2 to 1 tablet  every Morning for Dieting & Weight Loss /  Patient knows to take by mouth 07/25/21   Unk Pinto, MD  rosuvastatin (CRESTOR) 40 MG tablet Take 1 tablet  Daily  for Cholesterol  /  Patient knows to take by mouth 07/25/21   Unk Pinto, MD  tadalafil (CIALIS) 20 MG tablet Take  1/2 to 1 tablet  every  2 to 3 days  as needed for XXXX 01/19/21   Unk Pinto, MD  testosterone cypionate (DEPOTESTOSTERONE CYPIONATE) 200 MG/ML injection INJECT TWO MILLILITERS INTRAMUSCULARLY EVERY 2 WEEKS 06/28/21   Magda Bernheim, NP  tirzepatide [facility]) 12.5 MG/0.5ML Pen Inject 12.5 mg into the skin once a week. 09/26/21   Unk Pinto, MD  topiramate (TOPAMAX) 50 MG tablet Take  1 tablet  2 x /day  at Supper & Bedtime for Dieting & Weight Loss 04/27/21   Unk Pinto, MD    Current Outpatient Medications  Medication Sig Dispense Refill   AMBULATORY NON FORMULARY MEDICATION Nitroglycerin ointment 0.125 % apply a pea size amount to your rectum three times daily x 6-8 weeks. 30 g 1   aspirin EC 81 MG tablet Take 81 mg by mouth daily.     Cholecalciferol (VITAMIN D PO) Take 2,000 Int'l Units by mouth daily.     losartan-hydrochlorothiazide (HYZAAR) 50-12.5 MG tablet TAKE ONE TABLET BY MOUTH DAILY FOR BLOOD PRESSURE  90 tablet 1   minocycline (MINOCIN) 50 MG capsule Take  1 capsule  2 x /day  with Food  for Skin Infection 180 capsule 1   Multiple Vitamin (MULTIVITAMIN) tablet Take 1 tablet by mouth daily.     phentermine (ADIPEX-P) 37.5 MG tablet Take  1/2 to 1 tablet  every Morning for Dieting & Weight Loss /  Patient knows to take by mouth 90 tablet 1   rosuvastatin (CRESTOR) 40 MG tablet Take 1 tablet  Daily  for Cholesterol  /  Patient knows to take by mouth 90 tablet 3   tadalafil (CIALIS) 20 MG tablet Take  1/2 to 1 tablet  every  2 to 3 days  as needed for XXXX 30 tablet 3    testosterone cypionate (DEPOTESTOSTERONE CYPIONATE) 200 MG/ML injection INJECT TWO MILLILITERS INTRAMUSCULARLY EVERY 2 WEEKS 4 mL 5   tirzepatide (MOUNJARO) 12.5 MG/0.5ML Pen Inject 12.5 mg into the skin once a week. 6 mL 0   topiramate (TOPAMAX) 50 MG tablet Take  1 tablet  2 x /day  at Supper & Bedtime for Dieting & Weight Loss 180 tablet 3   Current Facility-Administered Medications  Medication Dose Route Frequency Provider Last Rate Last Admin   0.9 %  sodium chloride infusion  500 mL Intravenous Once Tadeusz Stahl V, DO        Allergies as of 10/24/2021   (No Known Allergies)    Family History  Problem Relation Age of Onset   Heart disease Father    Hyperlipidemia Father    Hypertension Father    Colon cancer Neg Hx    Esophageal cancer Neg Hx    Pancreatic cancer Neg Hx     Social History   Socioeconomic History   Marital status: Married    Spouse name: Not on file   Number of children: Not on file   Years of education: Not on file   Highest education level: Not on file  Occupational History   Not on file  Tobacco Use   Smoking status: Former    Types: Cigarettes   Smokeless tobacco: Former   Tobacco comments:    occasional cigar  Vaping Use   Vaping Use: Former  Substance and Sexual Activity   Alcohol use: Yes    Alcohol/week: 0.0 standard drinks    Comment: occasional   Drug use: No   Sexual activity: Yes  Other Topics Concern   Not on file  Social History Narrative   Not on file   Social Determinants of Health   Financial Resource Strain: Not on file  Food Insecurity: Not on file  Transportation Needs: Not on file  Physical Activity: Not on file  Stress: Not on file  Social Connections: Not on file  Intimate Partner Violence: Not on file    Physical Exam: Vital signs in last 24 hours: @BP  134/61 (BP Location: Right Arm, Patient Position: Sitting, Cuff Size: Normal)   Pulse 86   Temp 98 F (36.7 C) (Temporal)   Ht 5\' 9"  (1.753 m)   Wt  262 lb (118.8 kg)   SpO2 99%   BMI 38.69 kg/m  GEN: NAD EYE: Sclerae anicteric ENT: MMM CV: Non-tachycardic Pulm: CTA b/l GI: Soft, NT/ND NEURO:  Alert & Oriented x 3   Gerrit Heck, DO [facility]   10/24/2021 9:09 AM

## 2021-10-26 ENCOUNTER — Telehealth: Payer: Self-pay

## 2021-10-26 NOTE — Telephone Encounter (Signed)
  Follow up Call-  Call back number 10/24/2021  Post procedure Call Back phone  # [phone]  Permission to leave phone message Yes  Some recent data might be hidden     Patient questions:  Do you have a fever, pain , or abdominal swelling? No. Pain Score  0 *  Have you tolerated food without any problems? Yes.    Have you been able to return to your normal activities? Yes.    Do you have any questions about your discharge instructions: Diet   No. Medications  No. Follow up visit  No.  Do you have questions or concerns about your Care? No.  Actions: * If pain score is 4 or above: No action needed, pain <4. Have you developed a fever since your procedure? no  2.   Have you had an respiratory symptoms (SOB or cough) since your procedure? no  3.   Have you tested positive for COVID 19 since your procedure no  4.   Have you had any family members/close contacts diagnosed with the COVID 19 since your procedure?  no   If yes to any of these questions please route to Joylene John, RN and Joella Prince, RN

## 2021-10-27 ENCOUNTER — Encounter: Payer: Self-pay | Admitting: Gastroenterology

## 2021-11-10 ENCOUNTER — Encounter: Payer: Self-pay | Admitting: Gastroenterology

## 2021-11-17 ENCOUNTER — Other Ambulatory Visit: Payer: Self-pay | Admitting: Internal Medicine

## 2021-11-17 DIAGNOSIS — N521 Erectile dysfunction due to diseases classified elsewhere: Secondary | ICD-10-CM

## 2021-11-17 MED ORDER — TADALAFIL 20 MG PO TABS: ORAL_TABLET | ORAL | 3 refills | Status: DC

## 2021-12-11 ENCOUNTER — Encounter: Payer: Self-pay | Admitting: Internal Medicine

## 2022-01-12 ENCOUNTER — Other Ambulatory Visit: Payer: Self-pay | Admitting: Internal Medicine

## 2022-01-12 MED ORDER — MOUNJARO 12.5 MG/0.5ML ~~LOC~~ SOAJ: 12.5000 mg | SUBCUTANEOUS | 2 refills | Status: DC

## 2022-01-29 ENCOUNTER — Encounter: Payer: Self-pay | Admitting: Internal Medicine

## 2022-01-29 NOTE — Progress Notes (Signed)
Annual  Screening/Preventative Visit  & Comprehensive Evaluation & Examination  Future Appointments  Date Time Provider Department  01/30/2022 10:00 AM Unk Pinto, MD [facility]  02/05/2023 10:00 AM Unk Pinto, MD [facility]            This very nice 51 y.o. MBM presents for a Screening /Preventative Visit & comprehensive evaluation and management of multiple medical co-morbidities.  Patient has been followed for HTN, HLD, T2_NIDDM  and Vitamin D Deficiency.       HTN predates since     . Patient's BP has been controlled at home.  Today's BP is at goal - 120/78. Patient denies any cardiac symptoms as chest pain, palpitations, shortness of breath, dizziness or ankle swelling.       Patient's hyperlipidemia is controlled with diet and medications. Patient denies myalgias or other medication SE's. Last lipids were at goal except elevated Trig's : Lab Results  Component Value Date   CHOL 126 08/07/2021   HDL 23 (L) 08/07/2021   LDLCALC 68 08/07/2021   TRIG 265 (H) 08/07/2021   CHOLHDL 5.5 (H) 08/07/2021         Patient has hx/o T2_NIDDM  (A1c 6.5% /2012)  and last A1c 6.7%  May  2022. In Oct 2022, he was started on [facility] and has successfully lost 45#  weight from 266#  ( BMI 37.4 )  down to current weight   221  #  ( BMI  32.75 ) .  Patient denies reactive hypoglycemic symptoms, visual blurring, diabetic polys or paresthesias. Last A1c was not at goal :   Lab Results  Component Value Date   HGBA1C 6.2 (H) 08/07/2021     Wt Readings from Last 3 Encounters:  02/221/202 221  10/24/21 262 lb (118.8 kg)  08/07/21 266 lb 12.8 oz (121 kg)           Finally, patient has history of Vitamin D Deficiency and last vitamin D was at goal :   Lab Results  Component Value Date   VD25OH 61 05/01/2021     Current Outpatient Medications on File Prior to Visit  Medication Sig   nifedipine  ointment 0.2%  applied 2-4 times daily perirectally. Caution to do strenuous   exercise within 30 minutes of application   aspirin EC 81 MG tablet Take daily.   VITAMIN D 2,000 Units Take  daily.   losartan-hctz 50-12.5 MG tablet TAKE ONE TABLET DAILY    minocycline 50 MG capsule Take  1 capsule  2 x /day     Multiple Vitamin  Take 1 tablet daily.   phentermine (37.5 MG tablet Take  1/2 to 1 tablet  every Morning for Dieting    rosuvastatin 40 MG tablet Take 1 tablet  Daily    tadalafil  20 MG tablet Take  1/2 to 1 tablet  every  2 to 3 days  as needed    testosterone cypio 200 MG/ML injec Inject  2 ml every 2 weeksI   tirzepatide (MOUNJARO) 12.5 MG/0.5ML Pen Inject 12.5 mg into the skin once a week.   topiramate  50 MG tablet Take  1 tablet  2 x /day  Dieting     No Known Allergies   Past Medical History:  Diagnosis Date   Diabetes mellitus without complication ([place])    Hyperlipidemia    Hypertension    Other testicular hypofunction    Prediabetes    Sleep apnea      Health  Maintenance  Topic Date Due   HIV Screening  Never done   Zoster Vaccines- Shingrix (1 of 2) Never done   COVID-19 Vaccine  12/16/2020   INFLUENZA VACCINE  07/10/2021   TETANUS/TDAP  09/18/2025   COLONOSCOPY  10/25/2031   Hepatitis C Screening  Completed   HPV VACCINES  Aged Out     Immunization History  Administered Date(s) Administered   Influenza Inj Mdck Quad  11/04/2017   Influenza Split 09/22/2014, 09/19/2015, 09/12/2016   Influenza 09/09/2018, 10/21/2019   PFIZER SARS-COV-2 Vacc 11/25/2020   PPD Test 12/17/2018, 01/05/2020, 01/19/2021   Td 10/13/2002   Tdap 09/19/2015    Last Colon - 10/24/2021 - Dr Bryan Lemma  - Recommended 5 year  f/u due Dec 2027    Past Surgical History:  Procedure Laterality Date   ROTATOR CUFF REPAIR Right 2007   SHOULDER ARTHROSCOPY Right 2000     Family History  Problem Relation Age of Onset   Heart disease Father    Hyperlipidemia Father    Hypertension Father    Colon cancer Neg Hx    Esophageal cancer Neg Hx     Pancreatic cancer Neg Hx      Social History   Socioeconomic History   Marital status: Married      Spouse name: Judeen Hammans   Number of children: 1 son  Occupational History   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 Does c/o fatigue. Eyes: Denies redness, blurred vision, diplopia, discharge, itchy or watery eyes.  ENT: Denies discharge, congestion, post nasal drip, epistaxis, sore throat, earache, hearing loss, dental pain, Tinnitus, Vertigo, Sinus pain or snoring.  Cardio: Denies chest pain, palpitations, irregular heartbeat, syncope, dyspnea, diaphoresis, orthopnea, PND, claudication or edema Respiratory: denies cough, dyspnea, DOE, pleurisy, hoarseness, laryngitis or wheezing.  Gastrointestinal: Denies dysphagia, heartburn, reflux, water brash, pain, cramps, nausea, vomiting, bloating, diarrhea, constipation, hematemesis, melena, hematochezia, jaundice or hemorrhoids Genitourinary: Denies dysuria, frequency, urgency, nocturia, hesitancy, discharge, hematuria or flank pain Musculoskeletal: Denies arthralgia, myalgia, stiffness, Jt. Swelling, pain, limp or strain/sprain. Denies Falls. Skin: Denies puritis, rash, hives, warts, acne, eczema or change in skin lesion Neuro: No weakness, tremor, incoordination, spasms, paresthesia or pain Psychiatric: Denies confusion, memory loss or sensory loss. Denies Depression. Endocrine: Denies change in weight, skin, hair change, nocturia, and paresthesia, diabetic polys, visual blurring or hyper / hypo glycemic episodes.   Heme/Lymph: No excessive bleeding, bruising or enlarged lymph nodes.   Physical Exam  BP 120/78    Pulse 100    Temp 97.9 F (36.6 C)    Resp 16    Wt 221 lb 12.8 oz (100.6 kg)    SpO2 98%    BMI 32.75 kg/m   General Appearance: Well nourished and well groomed and in no apparent distress.  Eyes: PERRLA, EOMs, conjunctiva no swelling or erythema, normal fundi and vessels. Sinuses: No frontal/maxillary tenderness ENT/Mouth: EACs patent / TMs  nl. Nares clear without erythema, swelling, mucoid exudates. Oral hygiene is good. No erythema,  swelling, or exudate. Tongue normal, non-obstructing. Tonsils not swollen or erythematous. Hearing normal.  Neck: Supple, thyroid not palpable. No bruits, nodes or JVD. Respiratory: Respiratory effort normal.  BS equal and clear bilateral without rales, rhonci, wheezing or stridor. Cardio: Heart sounds are normal with regular rate and rhythm and no murmurs, rubs or gallops. Peripheral pulses are normal and equal bilaterally without edema. No aortic or femoral bruits. Chest: symmetric with normal excursions and percussion.  Abdomen: Soft, with Nl bowel sounds. Nontender, no guarding, rebound, hernias, masses, or organomegaly.  Lymphatics: Non tender without lymphadenopathy.  Musculoskeletal: Full ROM all peripheral extremities, joint stability, 5/5 strength, and normal gait. Skin: Warm and dry without rashes, lesions, cyanosis, clubbing or  ecchymosis.  Neuro: Cranial nerves intact, reflexes equal bilaterally. Normal muscle tone, no cerebellar symptoms. Sensation intact.  Pysch: Alert and oriented X 3 with normal affect, insight and judgment appropriate.   Assessment and Plan  1. Annual Preventative/Screening Exam    2. Essential hypertension  - CBC with Differential/Platelet - COMPLETE METABOLIC PANEL WITH GFR - Magnesium - TSH - EKG 12-Lead - [place], RETROPERITNL ABD,  LTD - Urinalysis, Routine w reflex microscopic - Microalbumin / creatinine urine  ratio  3. Hyperlipidemia associated with type 2 diabetes mellitus ([place])  - Lipid panel - TSH - EKG 12-Lead - [place], RETROPERITNL ABD,  LTD  4. Type 2 diabetes mellitus with stage 2 chronic kidney  disease, without long-term current use of insulin ([facility])  - Hemoglobin A1c - Insulin, random - EKG 12-Lead - [place], RETROPERITNL ABD,  LTD - Urinalysis, Routine w reflex microscopic - Microalbumin / creatinine urine ratio - HM DIABETES FOOT EXAM - PR LOW EXTEMITY NEUR EXAM DOCUM  5. Class 2 severe obesity due to excess calories with serious  comorbidity and body mass index (BMI) of 36.0 to 36.9 in adult (HCC)  - TSH  6. Vitamin D deficiency  - VITAMIN D 25 Hydroxy   7. Testosterone deficiency  - Testosterone  8. OSA on CPAP   9. Screening examination for pulmonary tuberculosis  - TB Skin Test  10. Screening for colorectal cancer  - POC Hemoccult Bld/Stl   11. Prostate cancer screening  - PSA  12. Screening for ischemic heart disease  - EKG 12-Lead  13. FHx: heart disease  - EKG 12-Lead - [place], RETROPERITNL ABD,  LTD  14. Screening for AAA (aortic abdominal aneurysm)  - [place], RETROPERITNL ABD,  LTD  15. Fatigue, unspecified type  - CBC with Differential/Platelet - TSH - Iron, Total/Total Iron Binding Cap - Vitamin B12  16. Medication management  - CBC with Differential/Platelet - COMPLETE METABOLIC PANEL WITH GFR - Magnesium - Lipid panel - TSH - Hemoglobin A1c - Insulin, random - VITAMIN D 25 Hydroxy  - Testosterone - Urinalysis, Routine w reflex microscopic - Microalbumin / creatinine urine ratio           Patient was counseled in prudent diet, weight control to achieve/maintain BMI less than 25, BP monitoring, regular exercise and medications as discussed.  Discussed med effects and SE's. Routine screening labs and tests as requested with regular follow-up as recommended. Over 40 minutes of exam, counseling, chart review and high complex critical  decision making was performed   Kirtland Bouchard, MD

## 2022-01-29 NOTE — Patient Instructions (Signed)

## 2022-01-30 ENCOUNTER — Other Ambulatory Visit: Payer: Self-pay

## 2022-01-30 ENCOUNTER — Encounter: Payer: Self-pay | Admitting: Internal Medicine

## 2022-01-30 ENCOUNTER — Ambulatory Visit (INDEPENDENT_AMBULATORY_CARE_PROVIDER_SITE_OTHER): Payer: 59 | Admitting: Internal Medicine

## 2022-01-30 VITALS — BP 120/78 | HR 100 | Temp 97.9°F | Resp 16 | Ht 69.0 in | Wt 221.8 lb

## 2022-01-30 DIAGNOSIS — Z125 Encounter for screening for malignant neoplasm of prostate: Secondary | ICD-10-CM

## 2022-01-30 DIAGNOSIS — Z0000 Encounter for general adult medical examination without abnormal findings: Secondary | ICD-10-CM | POA: Diagnosis not present

## 2022-01-30 DIAGNOSIS — Z8249 Family history of ischemic heart disease and other diseases of the circulatory system: Secondary | ICD-10-CM | POA: Diagnosis not present

## 2022-01-30 DIAGNOSIS — N182 Chronic kidney disease, stage 2 (mild): Secondary | ICD-10-CM

## 2022-01-30 DIAGNOSIS — Z111 Encounter for screening for respiratory tuberculosis: Secondary | ICD-10-CM

## 2022-01-30 DIAGNOSIS — R5383 Other fatigue: Secondary | ICD-10-CM

## 2022-01-30 DIAGNOSIS — Z0001 Encounter for general adult medical examination with abnormal findings: Secondary | ICD-10-CM

## 2022-01-30 DIAGNOSIS — Z136 Encounter for screening for cardiovascular disorders: Secondary | ICD-10-CM | POA: Diagnosis not present

## 2022-01-30 DIAGNOSIS — I10 Essential (primary) hypertension: Secondary | ICD-10-CM | POA: Diagnosis not present

## 2022-01-30 DIAGNOSIS — E782 Mixed hyperlipidemia: Secondary | ICD-10-CM

## 2022-01-30 DIAGNOSIS — E559 Vitamin D deficiency, unspecified: Secondary | ICD-10-CM

## 2022-01-30 DIAGNOSIS — E349 Endocrine disorder, unspecified: Secondary | ICD-10-CM

## 2022-01-30 DIAGNOSIS — Z79899 Other long term (current) drug therapy: Secondary | ICD-10-CM

## 2022-01-30 DIAGNOSIS — Z1211 Encounter for screening for malignant neoplasm of colon: Secondary | ICD-10-CM

## 2022-01-30 DIAGNOSIS — G4733 Obstructive sleep apnea (adult) (pediatric): Secondary | ICD-10-CM

## 2022-01-30 DIAGNOSIS — Z9989 Dependence on other enabling machines and devices: Secondary | ICD-10-CM

## 2022-01-30 DIAGNOSIS — E1122 Type 2 diabetes mellitus with diabetic chronic kidney disease: Secondary | ICD-10-CM

## 2022-01-30 DIAGNOSIS — E1169 Type 2 diabetes mellitus with other specified complication: Secondary | ICD-10-CM

## 2022-01-30 DIAGNOSIS — Z6836 Body mass index (BMI) 36.0-36.9, adult: Secondary | ICD-10-CM

## 2022-01-31 LAB — CBC WITH DIFFERENTIAL/PLATELET
Absolute Monocytes: 301 cells/uL (ref 200–950)
Basophils Absolute: 28 cells/uL (ref 0–200)
Basophils Relative: 0.6 %
Eosinophils Absolute: 80 cells/uL (ref 15–500)
Eosinophils Relative: 1.7 %
HCT: 46.9 % (ref 38.5–50.0)
Hemoglobin: 15.3 g/dL (ref 13.2–17.1)
Lymphs Abs: 1908 cells/uL (ref 850–3900)
MCH: 25.8 pg — ABNORMAL LOW (ref 27.0–33.0)
MCHC: 32.6 g/dL (ref 32.0–36.0)
MCV: 79.2 fL — ABNORMAL LOW (ref 80.0–100.0)
MPV: 10 fL (ref 7.5–12.5)
Monocytes Relative: 6.4 %
Neutro Abs: 2383 cells/uL (ref 1500–7800)
Neutrophils Relative %: 50.7 %
Platelets: 284 10*3/uL (ref 140–400)
RBC: 5.92 10*6/uL — ABNORMAL HIGH (ref 4.20–5.80)
RDW: 14.6 % (ref 11.0–15.0)
Total Lymphocyte: 40.6 %
WBC: 4.7 10*3/uL (ref 3.8–10.8)

## 2022-01-31 LAB — LIPID PANEL
Cholesterol: 147 mg/dL (ref <200)
HDL: 37 mg/dL — ABNORMAL LOW (ref >=40)
LDL Cholesterol (Calc): 90 mg/dL (calc)
Non-HDL Cholesterol (Calc): 110 mg/dL (calc) (ref <130)
Total CHOL/HDL Ratio: 4 (calc) (ref <5.0)
Triglycerides: 100 mg/dL (ref <150)

## 2022-01-31 LAB — COMPLETE METABOLIC PANEL WITH GFR
AG Ratio: 1.7 (calc) (ref 1.0–2.5)
ALT: 42 U/L (ref 9–46)
AST: 32 U/L (ref 10–35)
Albumin: 4.7 g/dL (ref 3.6–5.1)
Alkaline phosphatase (APISO): 55 U/L (ref 35–144)
BUN: 15 mg/dL (ref 7–25)
CO2: 25 mmol/L (ref 20–32)
Calcium: 9.7 mg/dL (ref 8.6–10.3)
Chloride: 104 mmol/L (ref 98–110)
Creat: 1.07 mg/dL (ref 0.70–1.30)
Globulin: 2.7 g/dL (calc) (ref 1.9–3.7)
Glucose, Bld: 87 mg/dL (ref 65–99)
Potassium: 4.5 mmol/L (ref 3.5–5.3)
Sodium: 137 mmol/L (ref 135–146)
Total Bilirubin: 0.5 mg/dL (ref 0.2–1.2)
Total Protein: 7.4 g/dL (ref 6.1–8.1)
eGFR: 85 mL/min/{1.73_m2} (ref >=60)

## 2022-01-31 LAB — INSULIN, RANDOM: Insulin: 21.7 u[IU]/mL — ABNORMAL HIGH

## 2022-01-31 LAB — URINALYSIS, ROUTINE W REFLEX MICROSCOPIC
Bilirubin Urine: NEGATIVE
Glucose, UA: NEGATIVE
Hgb urine dipstick: NEGATIVE
Ketones, ur: NEGATIVE
Leukocytes,Ua: NEGATIVE
Nitrite: NEGATIVE
Protein, ur: NEGATIVE
Specific Gravity, Urine: 1.013 (ref 1.001–1.035)
pH: 5.5 (ref 5.0–8.0)

## 2022-01-31 LAB — MICROALBUMIN / CREATININE URINE RATIO
Creatinine, Urine: 60 mg/dL (ref 20–320)
Microalb, Ur: <0.2 mg/dL

## 2022-01-31 LAB — VITAMIN B12: Vitamin B-12: 839 pg/mL (ref 200–1100)

## 2022-01-31 LAB — TESTOSTERONE: Testosterone: 152 ng/dL — ABNORMAL LOW (ref 250–827)

## 2022-01-31 LAB — HEMOGLOBIN A1C
Hgb A1c MFr Bld: 5.7 % of total Hgb — ABNORMAL HIGH (ref <5.7)
Mean Plasma Glucose: 117 mg/dL
eAG (mmol/L): 6.5 mmol/L

## 2022-01-31 LAB — IRON, TOTAL/TOTAL IRON BINDING CAP
%SAT: 56 % (calc) — ABNORMAL HIGH (ref 20–48)
Iron: 151 ug/dL (ref 50–180)
TIBC: 271 mcg/dL (calc) (ref 250–425)

## 2022-01-31 LAB — MAGNESIUM: Magnesium: 2 mg/dL (ref 1.5–2.5)

## 2022-01-31 LAB — TSH: TSH: 0.63 mIU/L (ref 0.40–4.50)

## 2022-01-31 LAB — PSA: PSA: 0.22 ng/mL (ref <=4.00)

## 2022-01-31 NOTE — Progress Notes (Signed)
=============================================================== °-   Test results slightly outside the reference range are not unusual. If there is anything important, I will review this with you,  otherwise it is considered normal test values.  If you have further questions,  please do not hesitate to contact me at the office or via My Chart.  =============================================================== ===============================================================  -  Total Chol = 147    &   LDL Chol = 90   - Both  Excellent   - Very low risk for Heart Attack  / Stroke  - Please  continue Rosuvastatin /Crestor same  ============================================================ ============================================================  -  A1c much better - down to 5.7% - almost back in Normal non-Diabetic range =============================================================== ===============================================================  -  Also - Insulin level is much better down from 132 to now 21.7 -> essentially normal   ( High Insulin is a sign of insulin resistance which is the 1st step of Diabetes ) =============================================================== ===============================================================  -  Testosterone level is down at end of shot interval  - Taking Zinc 50 mg /day  may help raise Testosterone levels.  =============================================================== ===============================================================  -  Iron level & Vitamin B12 levels are both OK  =============================================================== ===============================================================  -  PSA is low - Great   =============================================================== ===============================================================  -  All Else - CBC - Kidneys - Electrolytes - Liver - Magnesium & Thyroid     - all  Normal / OK =============================================================== ===============================================================

## 2022-02-27 ENCOUNTER — Other Ambulatory Visit: Payer: Self-pay | Admitting: Internal Medicine

## 2022-02-27 MED ORDER — TESTOSTERONE CYPIONATE 200 MG/ML IM SOLN: INTRAMUSCULAR | 2 refills | Status: DC

## 2022-03-27 ENCOUNTER — Other Ambulatory Visit: Payer: Self-pay | Admitting: Internal Medicine

## 2022-04-02 ENCOUNTER — Other Ambulatory Visit: Payer: Self-pay | Admitting: Internal Medicine

## 2022-04-02 MED ORDER — TIRZEPATIDE 5 MG/0.5ML ~~LOC~~ SOAJ
5.0000 mg | SUBCUTANEOUS | 1 refills | Status: DC
Start: 2022-04-02 — End: 2022-12-28

## 2022-04-29 ENCOUNTER — Other Ambulatory Visit: Payer: Self-pay | Admitting: Internal Medicine

## 2022-05-01 ENCOUNTER — Other Ambulatory Visit: Payer: Self-pay

## 2022-05-01 MED ORDER — LOSARTAN POTASSIUM-HCTZ 50-12.5 MG PO TABS: ORAL_TABLET | ORAL | 1 refills | Status: DC

## 2022-05-11 ENCOUNTER — Encounter: Payer: Self-pay | Admitting: Adult Health

## 2022-05-11 ENCOUNTER — Ambulatory Visit (INDEPENDENT_AMBULATORY_CARE_PROVIDER_SITE_OTHER): Payer: 59 | Admitting: Adult Health

## 2022-05-11 VITALS — BP 130/90 | HR 105 | Temp 97.7°F | Wt 220.0 lb

## 2022-05-11 DIAGNOSIS — J300 Vasomotor rhinitis: Secondary | ICD-10-CM

## 2022-05-11 DIAGNOSIS — E349 Endocrine disorder, unspecified: Secondary | ICD-10-CM

## 2022-05-11 DIAGNOSIS — G4733 Obstructive sleep apnea (adult) (pediatric): Secondary | ICD-10-CM

## 2022-05-11 DIAGNOSIS — R7303 Prediabetes: Secondary | ICD-10-CM | POA: Diagnosis not present

## 2022-05-11 DIAGNOSIS — Z79899 Other long term (current) drug therapy: Secondary | ICD-10-CM

## 2022-05-11 DIAGNOSIS — I10 Essential (primary) hypertension: Secondary | ICD-10-CM

## 2022-05-11 DIAGNOSIS — E66811 Obesity, class 1: Secondary | ICD-10-CM

## 2022-05-11 DIAGNOSIS — E669 Obesity, unspecified: Secondary | ICD-10-CM

## 2022-05-11 DIAGNOSIS — E559 Vitamin D deficiency, unspecified: Secondary | ICD-10-CM

## 2022-05-11 DIAGNOSIS — E782 Mixed hyperlipidemia: Secondary | ICD-10-CM

## 2022-05-11 MED ORDER — IPRATROPIUM BROMIDE 0.06 % NA SOLN: NASAL | 3 refills | Status: AC

## 2022-05-11 NOTE — Progress Notes (Signed)
FOLLOW UP  Assessment and Plan:   Hypertension Continue current medications - start checking BPs at home Discussed DASH/low sodium diet, reviewed reading labels Monitor blood pressure at home; patient to call if consistently greater than 130/80 plan to increase HCTZ Reminder to go to the ER if any CP, SOB, nausea, dizziness, severe HA, changes vision/speech, left arm numbness and tingling and jaw pain.  Cholesterol Currently at LDL <100 goal with rosuvastatin 40 mg daily  Continue low cholesterol diet and exercise.  Check lipid panel.   Prediabetes Now on mounjaro -  Continue diet and exercise.  Perform daily foot/skin check, notify office of any concerning changes.  Check A1C q62m defer to next visit; monitor weight, serum glucose   Obesity with co morbidities - BMI 32 Long discussion about weight loss, diet, and exercise He reports significant weight progress per home scale; \ Today weighing in full uniform and vest Recommended diet heavy in fruits and veggies and low in animal meats, cheeses, and dairy products, appropriate calorie intake Discussed ideal weight for height Patient on phentermine with benefit and no SE, taking drug breaks;  Continue mounjaro Working on consistent resistance exercise routine Will follow up in 3 months  Vitamin D Def At goal at last visit; continue supplementation to maintain goal of 60-100 Defer Vit D level  Hypogonadism - continue to monitor, states medication is helping with symptoms of low T. Declines recheck today,  Sildenafil PRN     Continue diet and meds as discussed. Further disposition pending results of labs. Discussed med's effects and SE's.   Over 30 minutes of exam, counseling, chart review, and critical decision making was performed.   Future Appointments  Date Time Provider [place] 08/29/2022 10:30 AM MUnk Pinto MD [facility] None  02/05/2023 10:00 AM MUnk Pinto MD [facility] None     ----------------------------------------------------------------------------------------------------------------------  HPI 51y.o. male  presents for 3 month follow up on hypertension, cholesterol, prediabetes, obesity and vitamin D deficiency.   he is prescribed phentermine/topamax for weight loss; [facility] with work day snacking -   BMI is Body mass index is 32.49 kg/m., he has been working on diet and exercise.  Has gym membership but struggles to get there - planning on starting home resistance exercises  He reports has been cutting back on sugar (soda, coffee sweetener, liquid calories, candy) Generally trying to eat healthier, less fried, focus  Still does some bread but limits, will do whole wheat/brown bread  Less pasta/portions Drinking black coffee and water  -  On home scales he is down from 260 lb to around 220 lb as of a few weeks ago.  Wt Readings from Last 3 Encounters:  05/11/22 220 lb (99.8 kg)  01/30/22 221 lb 12.8 oz (100.6 kg)  10/24/21 262 lb (118.8 kg)   He does not have BP cuff, today their BP is BP: 130/90, similar by manual recheck   He does currently workout, walks a lot at work, doing rowing and getting stationary bike.  He denies chest pain, shortness of breath, dizziness.  BP Readings from Last 3 Encounters:  05/11/22 130/90  01/30/22 120/78  10/24/21 (!) 129/96    He is on cholesterol medication (rosuvastatin 40 mg daily) and denies myalgias. His LDL cholesterol is at goal, though trigs remain elevated. The cholesterol last visit was:   Lab Results  Component Value Date   CHOL 147 01/30/2022   HDL 37 (L) 01/30/2022   LBullard  90 01/30/2022   TRIG 100 01/30/2022   CHOLHDL 4.0 01/30/2022    He has been working on diet and exercise for prediabetes,  working aggressively on lifestyle, and denies foot ulcerations, increased appetite, nausea, paresthesia of the feet, polydipsia, polyuria, visual disturbances,  vomiting and weight loss. Last A1C in the office was:  Lab Results  Component Value Date   HGBA1C 5.7 (H) 01/30/2022   Last GFR:  Lab Results  Component Value Date   GFRAA 108 05/01/2021   Patient is on Vitamin D supplement.   Lab Results  Component Value Date   VD25OH 45 05/01/2021     He has a history of testosterone deficiency and is on testosterone replacement, taking 200 mg once a week, last shot 6 days ago. He states that the testosterone helps with his energy, libido, muscle mass. Lab Results  Component Value Date   TESTOSTERONE 152 (L) 01/30/2022      Current Medications:  Current Outpatient Medications on File Prior to Visit  Medication Sig   AMBULATORY NON FORMULARY MEDICATION nifedipine  ointment 0.2% applied 2-4 times daily perirectally. Caution to do strenuous  exercise within 30 minutes of application (Patient not taking: Reported on 05/11/2022)   aspirin EC 81 MG tablet Take 81 mg by mouth daily.   Cholecalciferol (VITAMIN D PO) Take 2,000 Int'l Units by mouth daily.   losartan-hydrochlorothiazide (HYZAAR) 50-12.5 MG tablet TAKE ONE TABLET BY MOUTH DAILY FOR BLOOD PRESSURE   minocycline (MINOCIN) 50 MG capsule Take  1 capsule  2 x /day  with Food  for Skin Infection   Multiple Vitamin (MULTIVITAMIN) tablet Take 1 tablet by mouth daily.   phentermine (ADIPEX-P) 37.5 MG tablet Take 1/2 to 1 tablet               every Morning for Dieting & Weight Loss                 /                      TAKE                  BY                 MOUTH   rosuvastatin (CRESTOR) 40 MG tablet Take 1 tablet  Daily  for Cholesterol  /  Patient knows to take by mouth   tadalafil (CIALIS) 20 MG tablet Take  1/2 to 1 tablet  every  2 to 3 days  as needed for XXXX   testosterone cypionate (DEPOTESTOSTERONE CYPIONATE) 200 MG/ML injection Inject  1 ml  into Muscle  every week.   tirzepatide [facility]) 5 MG/0.5ML Pen Inject 5 mg into the skin once a week.   topiramate (TOPAMAX) 50 MG tablet Take  1  tablet  2 x /day  at Supper & Bedtime for Dieting & Weight Loss   No current facility-administered medications on file prior to visit.     Allergies: No Known Allergies   Medical History:  Past Medical History:  Diagnosis Date   Diabetes mellitus without complication ([place])    Hyperlipidemia    Hypertension    Other testicular hypofunction    Prediabetes    Sleep apnea    Family history- Reviewed and unchanged Social history- Reviewed and unchanged   Review of Systems:  Review of Systems  Constitutional:  Negative for malaise/fatigue and weight loss.  HENT:  Negative for congestion, hearing loss  and tinnitus.        Rhinitis  Eyes:  Negative for blurred vision and double vision.  Respiratory:  Negative for cough, shortness of breath and wheezing.   Cardiovascular:  Negative for chest pain, palpitations, orthopnea, claudication and leg swelling.  Gastrointestinal:  Negative for abdominal pain, blood in stool, constipation, diarrhea, heartburn, melena, nausea and vomiting.  Genitourinary: Negative.   Musculoskeletal:  Negative for joint pain and myalgias.  Skin:  Negative for rash.  Neurological:  Negative for dizziness, tingling, sensory change, weakness and headaches.  Endo/Heme/Allergies:  Negative for polydipsia.  Psychiatric/Behavioral: Negative.    All other systems reviewed and are negative.  Physical Exam: BP 130/90   Pulse (!) 105   Temp 97.7 F (36.5 C)   Wt 220 lb (99.8 kg)   SpO2 99%   BMI 32.49 kg/m  Wt Readings from Last 3 Encounters:  05/11/22 220 lb (99.8 kg)  01/30/22 221 lb 12.8 oz (100.6 kg)  10/24/21 262 lb (118.8 kg)   General Appearance: Well nourished, in no apparent distress. Eyes: PERRLA, EOMs, conjunctiva no swelling or erythema Sinuses: No Frontal/maxillary tenderness ENT/Mouth: Ext aud canals clear, TMs without erythema, bulging. No erythema, swelling, or exudate on post pharynx.  Tonsils not swollen or erythematous. Hearing normal.   Neck: Supple, thyroid normal.  Respiratory: Respiratory effort normal, BS equal bilaterally without rales, rhonchi, wheezing or stridor.  Cardio: RRR with no MRGs. Brisk peripheral pulses without edema.  Abdomen: Soft, + BS.  Non tender, no guarding, rebound, hernias, masses. Lymphatics: Non tender without lymphadenopathy.  Musculoskeletal: Full ROM, 5/5 strength, Normal gait Skin: Warm, dry without rashes, lesions, ecchymosis.  Neuro: Cranial nerves intact. No cerebellar symptoms.  Psych: Awake and oriented X 3, normal affect, Insight and Judgment appropriate.    Izora Ribas, NP 10:44 AM [facility]

## 2022-05-11 NOTE — Patient Instructions (Signed)
HYPERTENSION INFORMATION  Monitor your blood pressure at home, please keep a record and bring that in with you to your next office visit.   Go to the ER if any CP, SOB, nausea, dizziness, severe HA, changes vision/speech  Testing/Procedures: HOW TO TAKE YOUR BLOOD PRESSURE: Rest 5 minutes before taking your blood pressure. Don't smoke or drink caffeinated beverages for at least 30 minutes before. Take your blood pressure before (not after) you eat. Sit comfortably with your back supported and both feet on the floor (don't cross your legs). Elevate your arm to heart level on a table or a desk. Use the proper sized cuff. It should fit smoothly and snugly around your bare upper arm. There should be enough room to slip a fingertip under the cuff. The bottom edge of the cuff should be 1 inch above the crease of the elbow.  Due to a recent study, SPRINT, we have changed our goal for the systolic or top blood pressure number. Ideally we want your top number at 120.  In the [facility] Trial, 5000 people were randomized to a goal BP of 120 and 5000 people were randomized to a goal BP of less than 140. The patients with the goal BP at 120 had LESS DEMENTIA, LESS HEART ATTACKS, AND LESS STROKES, AS WELL AS OVERALL DECREASED MORTALITY OR DEATH RATE.   There was another study that showed taking your blood pressure medications at night decrease cardiovascular events.  However if you are on a fluid pill, please take this in the morning.   If you are willing, our goal BP is the top number of 120.  Your most recent BP: BP: 130/90   Take your medications faithfully as instructed. Maintain a healthy weight. Get at least 150 minutes of aerobic exercise per week. Minimize salt intake. Minimize alcohol intake  DASH Eating Plan DASH stands for "Dietary Approaches to Stop Hypertension." The DASH eating plan is a healthy eating plan that has been shown to reduce high blood pressure (hypertension).  Additional health benefits may include reducing the risk of type 2 diabetes mellitus, heart disease, and stroke. The DASH eating plan may also help with weight loss. WHAT DO I NEED TO KNOW ABOUT THE DASH EATING PLAN? For the DASH eating plan, you will follow these general guidelines: Choose foods with a percent daily value for sodium of less than 5% (as listed on the food label). Use salt-free seasonings or herbs instead of table salt or sea salt. Check with your health care provider or pharmacist before using salt substitutes. Eat lower-sodium products, often labeled as "lower sodium" or "no salt added." Eat fresh foods. Eat more vegetables, fruits, and low-fat dairy products. Choose whole grains. Look for the word "whole" as the first word in the ingredient list. Choose fish and skinless chicken or [place] more often than red meat. Limit fish, poultry, and meat to 6 oz (170 g) each day. Limit sweets, desserts, sugars, and sugary drinks. Choose heart-healthy fats. Limit cheese to 1 oz (28 g) per day. Eat more home-cooked food and less restaurant, buffet, and fast food. Limit fried foods. Cook foods using methods other than frying. Limit canned vegetables. If you do use them, rinse them well to decrease the sodium. When eating at a restaurant, ask that your food be prepared with less salt, or no salt if possible. WHAT FOODS CAN I EAT? Seek help from a dietitian for individual calorie needs. Grains Whole grain or whole wheat bread.  Brown rice. Whole grain or whole wheat pasta. Quinoa, bulgur, and whole grain cereals. Low-sodium cereals. Corn or whole wheat flour tortillas. Whole grain cornbread. Whole grain crackers. Low-sodium crackers. Vegetables Fresh or frozen vegetables (raw, steamed, roasted, or grilled). Low-sodium or reduced-sodium tomato and vegetable juices. Low-sodium or reduced-sodium tomato sauce and paste. Low-sodium or reduced-sodium canned vegetables.  Fruits All fresh, canned  (in natural juice), or frozen fruits. Meat and Other Protein Products Ground beef (85% or leaner), grass-fed beef, or beef trimmed of fat. Skinless chicken or [place]. Ground chicken or [place]. Pork trimmed of fat. All fish and seafood. Eggs. Dried beans, peas, or lentils. Unsalted nuts and seeds. Unsalted canned beans. Dairy Low-fat dairy products, such as skim or 1% milk, 2% or reduced-fat cheeses, low-fat ricotta or cottage cheese, or plain low-fat yogurt. Low-sodium or reduced-sodium cheeses. Fats and Oils Tub margarines without trans fats. Light or reduced-fat mayonnaise and salad dressings (reduced sodium). Avocado. Safflower, olive, or canola oils. Natural peanut or almond butter. Other Unsalted popcorn and pretzels. The items listed above may not be a complete list of recommended foods or beverages. Contact your dietitian for more options. WHAT FOODS ARE NOT RECOMMENDED? Grains White bread. White pasta. White rice. Refined cornbread. Bagels and croissants. Crackers that contain trans fat. Vegetables Creamed or fried vegetables. Vegetables in a cheese sauce. Regular canned vegetables. Regular canned tomato sauce and paste. Regular tomato and vegetable juices. Fruits Dried fruits. Canned fruit in light or heavy syrup. Fruit juice. Meat and Other Protein Products Fatty cuts of meat. Ribs, chicken wings, bacon, sausage, bologna, salami, chitterlings, fatback, hot dogs, bratwurst, and packaged luncheon meats. Salted nuts and seeds. Canned beans with salt. Dairy Whole or 2% milk, cream, half-and-half, and cream cheese. Whole-fat or sweetened yogurt. Full-fat cheeses or blue cheese. Nondairy creamers and whipped toppings. Processed cheese, cheese spreads, or cheese curds. Condiments Onion and garlic salt, seasoned salt, table salt, and sea salt. Canned and packaged gravies. Worcestershire sauce. Tartar sauce. Barbecue sauce. Teriyaki sauce. Soy sauce, including reduced sodium. Steak sauce. Fish  sauce. Oyster sauce. Cocktail sauce. Horseradish. Ketchup and mustard. Meat flavorings and tenderizers. Bouillon cubes. Hot sauce. Tabasco sauce. Marinades. Taco seasonings. Relishes. Fats and Oils Butter, stick margarine, lard, shortening, ghee, and bacon fat. Coconut, palm kernel, or palm oils. Regular salad dressings. Other Pickles and olives. Salted popcorn and pretzels. The items listed above may not be a complete list of foods and beverages to avoid. Contact your dietitian for more information. WHERE CAN I FIND MORE INFORMATION? National Heart, Lung, and Blood Institute: travelstabloid.com Document Released: 11/15/2011 Document Revised: 04/12/2014 Document Reviewed: 09/30/2013 [facility] Patient Information 2015 [place], [place]. This information is not intended to replace advice given to you by your health care provider. Make sure you discuss any questions you have with your health care provider.

## 2022-05-12 LAB — LIPID PANEL
Cholesterol: 146 mg/dL (ref <200)
HDL: 39 mg/dL — ABNORMAL LOW (ref >=40)
LDL Cholesterol (Calc): 89 mg/dL (calc)
Non-HDL Cholesterol (Calc): 107 mg/dL (calc) (ref <130)
Total CHOL/HDL Ratio: 3.7 (calc) (ref <5.0)
Triglycerides: 87 mg/dL (ref <150)

## 2022-05-12 LAB — CBC WITH DIFFERENTIAL/PLATELET
Absolute Monocytes: 421 cells/uL (ref 200–950)
Basophils Absolute: 31 cells/uL (ref 0–200)
Basophils Relative: 0.6 %
Eosinophils Absolute: 78 cells/uL (ref 15–500)
Eosinophils Relative: 1.5 %
HCT: 51.1 % — ABNORMAL HIGH (ref 38.5–50.0)
Hemoglobin: 16.4 g/dL (ref 13.2–17.1)
Lymphs Abs: 2049 cells/uL (ref 850–3900)
MCH: 26.4 pg — ABNORMAL LOW (ref 27.0–33.0)
MCHC: 32.1 g/dL (ref 32.0–36.0)
MCV: 82.2 fL (ref 80.0–100.0)
MPV: 9.8 fL (ref 7.5–12.5)
Monocytes Relative: 8.1 %
Neutro Abs: 2621 cells/uL (ref 1500–7800)
Neutrophils Relative %: 50.4 %
Platelets: 275 10*3/uL (ref 140–400)
RBC: 6.22 10*6/uL — ABNORMAL HIGH (ref 4.20–5.80)
RDW: 14.1 % (ref 11.0–15.0)
Total Lymphocyte: 39.4 %
WBC: 5.2 10*3/uL (ref 3.8–10.8)

## 2022-05-12 LAB — COMPLETE METABOLIC PANEL WITH GFR
AG Ratio: 1.9 (calc) (ref 1.0–2.5)
ALT: 27 U/L (ref 9–46)
AST: 28 U/L (ref 10–35)
Albumin: 4.8 g/dL (ref 3.6–5.1)
Alkaline phosphatase (APISO): 50 U/L (ref 35–144)
BUN: 19 mg/dL (ref 7–25)
CO2: 28 mmol/L (ref 20–32)
Calcium: 9.6 mg/dL (ref 8.6–10.3)
Chloride: 104 mmol/L (ref 98–110)
Creat: 1.07 mg/dL (ref 0.70–1.30)
Globulin: 2.5 g/dL (calc) (ref 1.9–3.7)
Glucose, Bld: 95 mg/dL (ref 65–99)
Potassium: 4.1 mmol/L (ref 3.5–5.3)
Sodium: 139 mmol/L (ref 135–146)
Total Bilirubin: 0.4 mg/dL (ref 0.2–1.2)
Total Protein: 7.3 g/dL (ref 6.1–8.1)
eGFR: 85 mL/min/{1.73_m2} (ref >=60)

## 2022-05-12 LAB — TSH: TSH: 0.61 mIU/L (ref 0.40–4.50)

## 2022-05-12 LAB — MAGNESIUM: Magnesium: 2 mg/dL (ref 1.5–2.5)

## 2022-05-16 ENCOUNTER — Other Ambulatory Visit: Payer: Self-pay | Admitting: Internal Medicine

## 2022-05-16 ENCOUNTER — Encounter: Payer: Self-pay | Admitting: Internal Medicine

## 2022-05-16 DIAGNOSIS — L730 Acne keloid: Secondary | ICD-10-CM

## 2022-05-16 MED ORDER — MINOCYCLINE HCL 50 MG PO CAPS: ORAL_CAPSULE | ORAL | 1 refills | Status: DC

## 2022-07-30 ENCOUNTER — Other Ambulatory Visit: Payer: Self-pay | Admitting: Internal Medicine

## 2022-07-30 DIAGNOSIS — E782 Mixed hyperlipidemia: Secondary | ICD-10-CM

## 2022-07-31 ENCOUNTER — Other Ambulatory Visit: Payer: Self-pay

## 2022-07-31 MED ORDER — TOPIRAMATE 50 MG PO TABS: ORAL_TABLET | ORAL | 3 refills | Status: DC

## 2022-08-28 ENCOUNTER — Encounter: Payer: Self-pay | Admitting: Internal Medicine

## 2022-08-28 NOTE — Progress Notes (Unsigned)
Future Appointments  Date Time Provider Department  08/29/2022                        6 mo ov 10:30 AM Unk Pinto, MD [facility]  02/05/2023                        [facility]  10:00 AM Unk Pinto, MD [facility]    History of Present Illness:       This very nice 51 y.o. MBM presents for 3 month follow up with HTN, HLD, T2_NIDDM and Vitamin D Deficiency.        Patient is treated for HTN  since  & BP has been controlled at home. Today's BP is at goal -  130/82. Patient has had no complaints of any cardiac type chest pain, palpitations, dyspnea Vertell Limber /PND, dizziness, claudication or dependent edema.       Hyperlipidemia is controlled with diet & meds. Patient denies myalgias or other med SE's. Last Lipids were at goal :  Lab Results  Component Value Date   CHOL 146 05/11/2022   HDL 39 (L) 05/11/2022   LDLCALC 89 05/11/2022   TRIG 87 05/11/2022   CHOLHDL 3.7 05/11/2022     Also, the patient has history of T2_NIDDM  (A1c 6.5% /2012  and  A1c 6.7%  May  2022)  and he has had no symptoms of reactive hypoglycemia, diabetic polys, paresthesias or visual blurring.  Last A1c was near goal :  Lab Results  Component Value Date   HGBA1C 5.7 (H) 01/30/2022                                                         Further, the patient also has history of Vitamin D Deficiency  ("9" /2009) and supplements vitamin D . Last vitamin D was at goal :   Lab Results  Component Value Date   VD25OH 61 05/01/2021         Patient is on Depo-Testosterone  injection since 2010 for hx/o low T ("252"/2010 & "117"/2012) with improved stamina.    Current Outpatient Medications on File Prior to Visit  Medication Sig   aspirin EC 81 MG tablet Take  daily.   VITAMIN D  2,000 Units  Take daily.   ipratropium (ATROVENT) 0.06 % nasal spray Use 1 to 2 sprays each nostril 2 to 3 x /day as needed for watery nasal drainage   losartan-hctz  50-12.5 MG tablet TAKE ONE TABLET  DAILY    minocycline 50  MG capsule Take  1 capsule  2 x /day   for Skin Infection  ( off )    Multiple Vitamin  Take 1 tablet daily.   phentermine 37.5 MG tablet Take 1/2 to 1 tablet   every Morning    rosuvastatin  40 MG tablet Take  1 tablet  Daily    tadalafil  20 MG tablet Take  1/2 to 1 tablet  every  2 to 3 days  as needed for XXXX   testosterone cypio 200 MG/ML injection Inject  1 ml  into Muscle  every week.   tirzepatide [facility]) 5 MG/0.5ML Pen Inject 5 mg into the skin once a week.   topiramate  50 MG tablet Take  1 tablet  2 x /day  at Supper & Bedtime for Dieting      No Known Allergies   PMHx:   Past Medical History:  Diagnosis Date   Diabetes mellitus without complication ([place])    Hyperlipidemia    Hypertension    Other testicular hypofunction    Prediabetes    Sleep apnea      Immunization History  Administered Date(s) Administered   Influenza Inj Mdck Quad  11/04/2017   Influenza Split 09/22/2014, 09/19/2015, 09/12/2016   Influenza 09/09/2018, 10/21/2019   PFIZER-SARS-COV-2 Vacc 11/25/2020   PPD Test 12/17/2018, 01/05/2020, 01/19/2021, 01/30/2022   Td 10/13/2002   Tdap 09/19/2015     Past Surgical History:  Procedure Laterality Date   ROTATOR CUFF REPAIR Right 2007   SHOULDER ARTHROSCOPY Right 2000    FHx:    Reviewed / unchanged  SHx:    Reviewed / unchanged   Systems Review:  Constitutional: Denies fever, chills, wt changes, headaches, insomnia, fatigue, night sweats, change in appetite. Eyes: Denies redness, blurred vision, diplopia, discharge, itchy, watery eyes.  ENT: Denies discharge, congestion, post nasal drip, epistaxis, sore throat, earache, hearing loss, dental pain, tinnitus, vertigo, sinus pain, snoring.  CV: Denies chest pain, palpitations, irregular heartbeat, syncope, dyspnea, diaphoresis, orthopnea, PND, claudication or edema. Respiratory: denies cough, dyspnea, DOE, pleurisy, hoarseness, laryngitis, wheezing.  Gastrointestinal: Denies dysphagia,  odynophagia, heartburn, reflux, water brash, abdominal pain or cramps, nausea, vomiting, bloating, diarrhea, constipation, hematemesis, melena, hematochezia  or hemorrhoids. Genitourinary: Denies dysuria, frequency, urgency, nocturia, hesitancy, discharge, hematuria or flank pain. Musculoskeletal: Denies arthralgias, myalgias, stiffness, jt. swelling, pain, limping or strain/sprain.  Skin: Denies pruritus, rash, hives, warts, acne, eczema or change in skin lesion(s). Neuro: No weakness, tremor, incoordination, spasms, paresthesia or pain. Psychiatric: Denies confusion, memory loss or sensory loss. Endo: Denies change in weight, skin or hair change.  Heme/Lymph: No excessive bleeding, bruising or enlarged lymph nodes.  Physical Exam  BP 130/82   Pulse (!) 101   Temp 98 F (36.7 C)   Resp 17   Ht '5\' 9"'$  (1.753 m)   Wt 233 lb 6.4 oz (105.9 kg)   SpO2 98%   BMI 34.47 kg/m   Appears  over nourished, well groomed  and in no distress.  Eyes: PERRLA, EOMs, conjunctiva no swelling or erythema. Sinuses: No frontal/maxillary tenderness ENT/Mouth: EAC's clear, TM's nl w/o erythema, bulging. Nares clear w/o erythema, swelling, exudates. Oropharynx clear without erythema or exudates. Oral hygiene is good. Tongue normal, non obstructing. Hearing intact.  Neck: Supple. Thyroid not palpable. Car 2+/2+ without bruits, nodes or JVD. Chest: Respirations nl with BS clear & equal w/o rales, rhonchi, wheezing or stridor.  Cor: Heart sounds normal w/ regular rate and rhythm without sig. murmurs, gallops, clicks or rubs. Peripheral pulses normal and equal  without edema.  Abdomen: Soft & bowel sounds normal. Non-tender w/o guarding, rebound, hernias, masses or organomegaly.  Lymphatics: Unremarkable.  Musculoskeletal: Full ROM all peripheral extremities, joint stability, 5/5 strength and normal gait.  Skin: Warm, dry without exposed rashes, lesions or ecchymosis apparent.  Neuro: Cranial nerves intact,  reflexes equal bilaterally. Sensory-motor testing grossly intact. Tendon reflexes grossly intact.  Pysch: Alert & oriented x 3.  Insight and judgement nl & appropriate. No ideations.  Assessment and Plan:  1. Essential hypertension  - Continue medication, monitor blood pressure at home.  - Continue DASH diet.  Reminder to go to the ER if any CP,  SOB, nausea, dizziness, severe HA, changes vision/speech.   -  CBC with Differential/Platelet - COMPLETE METABOLIC PANEL WITH GFR - Magnesium - TSH  2. Hyperlipidemia associated with type 2 diabetes mellitus ([place])  - Continue diet/meds, exercise,& lifestyle modifications.  - Continue monitor periodic cholesterol/liver & renal functions    - Lipid panel - TSH  3. Type 2 diabetes mellitus with stage 2 chronic kidney  disease, without long-term current use of insulin ([facility])  - Continue diet, exercise  - Lifestyle modifications.  - Monitor appropriate labs    - Hemoglobin A1c - Insulin, random  4. Vitamin D deficiency  - Continue supplementation   - VITAMIN D 25 Hydroxy   5. Testosterone deficiency  - Testosterone  6. Medication management  - CBC with Differential/Platelet - COMPLETE METABOLIC PANEL WITH GFR - Magnesium - Lipid panel - TSH - Hemoglobin A1c - Insulin, random - VITAMIN D 25 Hydroxy - Testosterone         Discussed  regular exercise, BP monitoring, weight control to achieve/maintain BMI less than 25 and discussed med and SE's. Recommended labs to assess /monitor clinical status .  I discussed the assessment and treatment plan with the patient. The patient was provided an opportunity to ask questions and all were answered. The patient agreed with the plan and demonstrated an understanding of the instructions.  I provided over 30 minutes of exam, counseling, chart review and  complex critical decision making.        The patient was advised to call back or seek an in-person evaluation if the symptoms worsen or  if the condition fails to improve as anticipated.   Kirtland Bouchard, MD

## 2022-08-28 NOTE — Patient Instructions (Signed)

## 2022-08-29 ENCOUNTER — Encounter: Payer: Self-pay | Admitting: Internal Medicine

## 2022-08-29 ENCOUNTER — Ambulatory Visit (INDEPENDENT_AMBULATORY_CARE_PROVIDER_SITE_OTHER): Payer: 59 | Admitting: Internal Medicine

## 2022-08-29 VITALS — BP 130/82 | HR 101 | Temp 98.0°F | Resp 17 | Ht 69.0 in | Wt 233.4 lb

## 2022-08-29 DIAGNOSIS — E559 Vitamin D deficiency, unspecified: Secondary | ICD-10-CM

## 2022-08-29 DIAGNOSIS — Z79899 Other long term (current) drug therapy: Secondary | ICD-10-CM

## 2022-08-29 DIAGNOSIS — N182 Chronic kidney disease, stage 2 (mild): Secondary | ICD-10-CM

## 2022-08-29 DIAGNOSIS — I10 Essential (primary) hypertension: Secondary | ICD-10-CM

## 2022-08-29 DIAGNOSIS — E1122 Type 2 diabetes mellitus with diabetic chronic kidney disease: Secondary | ICD-10-CM | POA: Diagnosis not present

## 2022-08-29 DIAGNOSIS — E1169 Type 2 diabetes mellitus with other specified complication: Secondary | ICD-10-CM | POA: Diagnosis not present

## 2022-08-29 DIAGNOSIS — E349 Endocrine disorder, unspecified: Secondary | ICD-10-CM

## 2022-08-29 DIAGNOSIS — E785 Hyperlipidemia, unspecified: Secondary | ICD-10-CM

## 2022-08-30 LAB — CBC WITH DIFFERENTIAL/PLATELET
Absolute Monocytes: 535 cells/uL (ref 200–950)
Basophils Absolute: 33 cells/uL (ref 0–200)
Basophils Relative: 0.5 %
Eosinophils Absolute: 132 cells/uL (ref 15–500)
Eosinophils Relative: 2 %
HCT: 43.5 % (ref 38.5–50.0)
Hemoglobin: 14.7 g/dL (ref 13.2–17.1)
Lymphs Abs: 2020 cells/uL (ref 850–3900)
MCH: 25.9 pg — ABNORMAL LOW (ref 27.0–33.0)
MCHC: 33.8 g/dL (ref 32.0–36.0)
MCV: 76.6 fL — ABNORMAL LOW (ref 80.0–100.0)
MPV: 9.1 fL (ref 7.5–12.5)
Monocytes Relative: 8.1 %
Neutro Abs: 3881 cells/uL (ref 1500–7800)
Neutrophils Relative %: 58.8 %
Platelets: 283 10*3/uL (ref 140–400)
RBC: 5.68 10*6/uL (ref 4.20–5.80)
RDW: 15.1 % — ABNORMAL HIGH (ref 11.0–15.0)
Total Lymphocyte: 30.6 %
WBC: 6.6 10*3/uL (ref 3.8–10.8)

## 2022-08-30 LAB — HEMOGLOBIN A1C
Hgb A1c MFr Bld: 6.2 % of total Hgb — ABNORMAL HIGH (ref <5.7)
Mean Plasma Glucose: 131 mg/dL
eAG (mmol/L): 7.3 mmol/L

## 2022-08-30 LAB — COMPLETE METABOLIC PANEL WITH GFR
AG Ratio: 1.9 (calc) (ref 1.0–2.5)
ALT: 46 U/L (ref 9–46)
AST: 44 U/L — ABNORMAL HIGH (ref 10–35)
Albumin: 4.8 g/dL (ref 3.6–5.1)
Alkaline phosphatase (APISO): 57 U/L (ref 35–144)
BUN: 13 mg/dL (ref 7–25)
CO2: 24 mmol/L (ref 20–32)
Calcium: 9.6 mg/dL (ref 8.6–10.3)
Chloride: 105 mmol/L (ref 98–110)
Creat: 1.22 mg/dL (ref 0.70–1.30)
Globulin: 2.5 g/dL (calc) (ref 1.9–3.7)
Glucose, Bld: 97 mg/dL (ref 65–99)
Potassium: 3.9 mmol/L (ref 3.5–5.3)
Sodium: 138 mmol/L (ref 135–146)
Total Bilirubin: 0.4 mg/dL (ref 0.2–1.2)
Total Protein: 7.3 g/dL (ref 6.1–8.1)
eGFR: 72 mL/min/{1.73_m2} (ref >=60)

## 2022-08-30 LAB — LIPID PANEL
Cholesterol: 96 mg/dL (ref <200)
HDL: 34 mg/dL — ABNORMAL LOW (ref >=40)
LDL Cholesterol (Calc): 45 mg/dL (calc)
Non-HDL Cholesterol (Calc): 62 mg/dL (calc) (ref <130)
Total CHOL/HDL Ratio: 2.8 (calc) (ref <5.0)
Triglycerides: 85 mg/dL (ref <150)

## 2022-08-30 LAB — TESTOSTERONE: Testosterone: 1400 ng/dL — ABNORMAL HIGH (ref 250–827)

## 2022-08-30 LAB — VITAMIN D 25 HYDROXY (VIT D DEFICIENCY, FRACTURES): Vit D, 25-Hydroxy: 68 ng/mL (ref 30–100)

## 2022-08-30 LAB — TSH: TSH: 0.93 mIU/L (ref 0.40–4.50)

## 2022-08-30 LAB — INSULIN, RANDOM: Insulin: 52.8 u[IU]/mL — ABNORMAL HIGH

## 2022-08-30 LAB — MAGNESIUM: Magnesium: 2.1 mg/dL (ref 1.5–2.5)

## 2022-08-30 NOTE — Progress Notes (Signed)
<><><><><><><><><><><><><><><><><><><><><><><><><><><><><><><><><> <><><><><><><><><><><><><><><><><><><><><><><><><><><><><><><><><>  - A1c has gone up again to 6.2%  - and is elevated in the borderline and                                                                early or pre-diabetes range which has the same   300% increased risk for heart attack, stroke, cancer and                                                 alzheimer- type vascular dementia as full blown diabetes.   But the good news is that diet, exercise with weight loss can                                                                                     cure the early diabetes at this point. - So , Please work harder on more weight loss   - It is very important that you work harder with diet by  avoiding all foods that are white except chicken,  fish & calliflower.  - Avoid white rice  (brown & wild rice is OK),   - Avoid white potatoes  (sweet potatoes in moderation is OK),   White bread or wheat bread or anything made out of   white flour like bagels, donuts, rolls, buns, biscuits, cakes,  - pastries, cookies, pizza crust, and pasta (made from                                                                                   white flour & egg whites)   - vegetarian pasta or spinach or wheat pasta is OK.  - Multigrain breads like Arnold's, Pepper[employer] or   multigrain sandwich thins or high fiber breads like   Eureka bread or "Dave's Killer" breads that are                                                         4 to 5 grams fiber per slice !  are best.    Diet, exercise and weight loss can reverse and cure  diabetes in the early stages.    <><><><><><><><><><><><><><><><><><><><><><><><><><><><><><><><><> <><><><><><><><><><><><><><><><><><><><><><><><><><><><><><><><><>  -  Insulin = 52.8  ( Normal is less than 20  !  )  and shows insulin resistance   -  a sign of early diabetes and associated  with a 300 % greater risk for   heart attacks, strokes, cancer & Alzheimer type vascular dementia   - All this can be cured  and prevented with losing weight   - get Dr Fara Olden Fuhrman's book 'the End of Diabetes" and "the End of Dieting"                                                  - and add many years of good health to your life. <><><><><><><><><><><><><><><><><><><><><><><><><><><><><><><><><> <><><><><><><><><><><><><><><><><><><><><><><><><><><><><><><><><>  -  Testosterone is very elevated from recent shot  <><><><><><><><><><><><><><><><><><><><><><><><><><><><><><><><><> <><><><><><><><><><><><><><><><><><><><><><><><><><><><><><><><><>  -   -  Total  Chol =     96   - Wonderful             (  Ideal  or  Goal is less than 180  !  )  & -  Bad / Dangerous LDL  Chol = 45     - also Excellent              (  Ideal  or  Goal is less than 70  !  )   <><><><><><><><><><><><><><><><><><><><><><><><><><><><><><><><><> <><><><><><><><><><><><><><><><><><><><><><><><><><><><><><><><><>  -  Vitamin D = 68 - Excellent - Please keep dose same  <><><><><><><><><><><><><><><><><><><><><><><><><><><><><><><><><> <><><><><><><><><><><><><><><><><><><><><><><><><><><><><><><><><>  -  All Else - CBC - Kidneys - Electrolytes - Liver - Magnesium & Thyroid    - all  Normal / OK <><><><><><><><><><><><><><><><><><><><><><><><><><><><><><><><><> <><><><><><><><><><><><><><><><><><><><><><><><><><><><><><><><><>

## 2022-10-01 ENCOUNTER — Other Ambulatory Visit: Payer: Self-pay | Admitting: Internal Medicine

## 2022-10-01 MED ORDER — TESTOSTERONE CYPIONATE 200 MG/ML IM SOLN: INTRAMUSCULAR | 2 refills | Status: DC

## 2022-10-28 ENCOUNTER — Other Ambulatory Visit: Payer: Self-pay | Admitting: Internal Medicine

## 2022-10-28 DIAGNOSIS — N521 Erectile dysfunction due to diseases classified elsewhere: Secondary | ICD-10-CM

## 2022-11-26 ENCOUNTER — Other Ambulatory Visit: Payer: Self-pay | Admitting: Internal Medicine

## 2022-12-05 ENCOUNTER — Ambulatory Visit: Payer: 59 | Admitting: Nurse Practitioner

## 2022-12-13 ENCOUNTER — Ambulatory Visit (INDEPENDENT_AMBULATORY_CARE_PROVIDER_SITE_OTHER): Payer: 59 | Admitting: Nurse Practitioner

## 2022-12-13 VITALS — BP 128/94 | HR 97 | Temp 97.5°F | Ht 69.0 in | Wt 245.0 lb

## 2022-12-13 DIAGNOSIS — E1169 Type 2 diabetes mellitus with other specified complication: Secondary | ICD-10-CM | POA: Diagnosis not present

## 2022-12-13 DIAGNOSIS — E1122 Type 2 diabetes mellitus with diabetic chronic kidney disease: Secondary | ICD-10-CM

## 2022-12-13 DIAGNOSIS — N182 Chronic kidney disease, stage 2 (mild): Secondary | ICD-10-CM

## 2022-12-13 DIAGNOSIS — L730 Acne keloid: Secondary | ICD-10-CM

## 2022-12-13 DIAGNOSIS — G4733 Obstructive sleep apnea (adult) (pediatric): Secondary | ICD-10-CM

## 2022-12-13 DIAGNOSIS — Z79899 Other long term (current) drug therapy: Secondary | ICD-10-CM

## 2022-12-13 DIAGNOSIS — Z6836 Body mass index (BMI) 36.0-36.9, adult: Secondary | ICD-10-CM

## 2022-12-13 DIAGNOSIS — E349 Endocrine disorder, unspecified: Secondary | ICD-10-CM

## 2022-12-13 DIAGNOSIS — I10 Essential (primary) hypertension: Secondary | ICD-10-CM | POA: Diagnosis not present

## 2022-12-13 DIAGNOSIS — R221 Localized swelling, mass and lump, neck: Secondary | ICD-10-CM

## 2022-12-13 DIAGNOSIS — E785 Hyperlipidemia, unspecified: Secondary | ICD-10-CM

## 2022-12-13 NOTE — Progress Notes (Signed)
FOLLOW UP  Assessment and Plan:   Hypertension Continue current medications - start checking BPs at home Discussed DASH/low sodium diet, reviewed reading labels Monitor blood pressure at home; patient to call if consistently greater than 130/80 plan to increase HCTZ Reminder to go to the ER if any CP, SOB, nausea, dizziness, severe HA, changes vision/speech, left arm numbness and tingling and jaw pain.  Cholesterol Discussed lifestyle modifications. Recommended diet heavy in fruits and veggies, omega 3's. Decrease consumption of animal meats, cheeses, and dairy products. Remain active and exercise as tolerated. Continue to monitor. Check lipids/TSH   Prediabetes Continue Mounjaro. Discussed increasing dose back to 12.5 mg weekly. Education: Reviewed 'ABCs' of diabetes management  Discussed goals to be met and/or maintained include A1C (<7) Blood pressure (<130/80) Cholesterol (LDL <70) Continue Eye Exam yearly  Continue Dental Exam Q6 mo Discussed dietary recommendations Discussed Physical Activity recommendations Check A1C   Obesity with co morbidities - BMI 32 Discussed appropriate BMI Diet modification. Physical activity. Encouraged/praised to build confidence.  Hypogonadism/testosterone deficiency Medication effective. Continue to monitor  Sildenafil PRN   Folliculitis Keloidalis Nuchae Refer to [facility] for further review and evaluation.  Neck mass Possible lipoma Obtain US to assess for any ynderlying disease process  Orders Placed This Encounter  Procedures   US Soft Tissue Head/Neck (NON-THYROID)    Standing Status:   Future    Standing Expiration Date:   12/14/2023    Order Specific Question:   Reason for Exam (SYMPTOM  OR DIAGNOSIS REQUIRED)    Answer:   Palpable mass along right posterior neck    Order Specific Question:   Preferred imaging location?    Answer:   [facility]   CBC with Differential/Platelet   COMPLETE METABOLIC PANEL  WITH GFR   Lipid panel   Hemoglobin A1c     Notify office for further evaluation and treatment, questions or concerns if any reported s/s fail to improve.   The patient was advised to call back or seek an in-person evaluation if any symptoms worsen or if the condition fails to improve as anticipated.   Further disposition pending results of labs. Discussed med's effects and SE's.    I discussed the assessment and treatment plan with the patient. The patient was provided an opportunity to ask questions and all were answered. The patient agreed with the plan and demonstrated an understanding of the instructions.  Discussed med's effects and SE's. Screening labs and tests as requested with regular follow-up as recommended.  I provided 20 minutes of face-to-face time during this encounter including counseling, chart review, and critical decision making was preformed.   Future Appointments  Date Time Provider [place]  03/25/2023  3:00 PM Unk Pinto, MD [facility] None    ----------------------------------------------------------------------------------------------------------------------  HPI 52 y.o. male  presents for 3 month follow up on hypertension, cholesterol, prediabetes, obesity and vitamin D deficiency.   He is concerned for a palpable mass along the back side of his neck.  Has noticed over the last month.  Denies pain, warmth, redness, drainage, injury, fall, trauma.  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  Reports seeing Dermatology in the past and received injections.  He has recently been on a trial of oral antibiotics as well as topical creams which have not been effective.    he is prescribed phentermine/topamax for weight loss; takes in addition to mounjaro.  BMI is Body mass index is 36.18 kg/m., he has been working on diet and  exercise.  Wt Readings from Last 3 Encounters:  12/13/22 245 lb (111.1 kg)  08/29/22 233 lb 6.4 oz (105.9 kg)   05/11/22 220 lb (99.8 kg)   He does not have BP cuff, today their BP is BP: (!) 128/94, similar by manual recheck   He does currently workout, walks a lot at work, doing rowing and getting stationary bike.  He denies chest pain, shortness of breath, dizziness.  BP Readings from Last 3 Encounters:  12/13/22 (!) 128/94  08/29/22 130/82  05/11/22 130/90    He is on cholesterol medication (rosuvastatin 40 mg daily) and denies myalgias. His LDL cholesterol is at goal, though trigs remain elevated. The cholesterol last visit was:   Lab Results  Component Value Date   CHOL 96 08/29/2022   HDL 34 (L) 08/29/2022   LDLCALC 45 08/29/2022   TRIG 85 08/29/2022   CHOLHDL 2.8 08/29/2022    He has been working on diet and exercise for prediabetes,  working aggressively on lifestyle, and denies foot ulcerations, increased appetite, nausea, paresthesia of the feet, polydipsia, polyuria, visual disturbances, vomiting and weight loss. Last A1C in the office was:  Lab Results  Component Value Date   HGBA1C 6.2 (H) 08/29/2022   Last GFR:  Lab Results  Component Value Date   GFRAA 108 05/01/2021   Patient is on Vitamin D supplement.   Lab Results  Component Value Date   VD25OH 55 08/29/2022     He has a history of testosterone deficiency and is on testosterone replacement.  He states that the testosterone helps with his energy, libido, muscle mass. Lab Results  Component Value Date   TESTOSTERONE 1,400 (H) 08/29/2022    BMI is Body mass index is 36.18 kg/m., he has been working on diet and exercise. Wt Readings from Last 3 Encounters:  12/13/22 245 lb (111.1 kg)  08/29/22 233 lb 6.4 oz (105.9 kg)  05/11/22 220 lb (99.8 kg)     Current Medications:  Current Outpatient Medications on File Prior to Visit  Medication Sig   aspirin EC 81 MG tablet Take 81 mg by mouth daily.   Cholecalciferol (VITAMIN D PO) Take 2,000 Int'l Units by mouth daily.   ipratropium (ATROVENT) 0.06 % nasal spray  Use 1 to 2 sprays each nostril 2 to 3 x /day as needed for watery nasal drainage   losartan-hydrochlorothiazide (HYZAAR) 50-12.5 MG tablet TAKE ONE TABLET BY MOUTH DAILY FOR BLOOD PRESSURE   Multiple Vitamin (MULTIVITAMIN) tablet Take 1 tablet by mouth daily.   phentermine (ADIPEX-P) 37.5 MG tablet TAKE 1/2 TO 1 TABLET EVERY MORNING FOR DIETING AND WEIGHT LOSS   rosuvastatin (CRESTOR) 40 MG tablet Take  1 tablet  Daily for Cholesterol                                                                /                            TAKE                        BY  MOUTH   tadalafil (CIALIS) 20 MG tablet Take 1/2 to 1 tablet every 2 to 3 days as needed for XXXX                                         /                                                     TAKE                                       BY                                 MOUTH   testosterone cypionate (DEPOTESTOSTERONE CYPIONATE) 200 MG/ML injection Inject  1 ml  into Muscle  every week.   tirzepatide [facility]) 5 MG/0.5ML Pen Inject 5 mg into the skin once a week.   topiramate (TOPAMAX) 50 MG tablet Take  1 tablet  2 x /day  at Supper & Bedtime for Dieting & Weight Loss   AMBULATORY NON FORMULARY MEDICATION nifedipine  ointment 0.2% applied 2-4 times daily perirectally. Caution to do strenuous  exercise within 30 minutes of application (Patient not taking: Reported on 05/11/2022)   minocycline (MINOCIN) 50 MG capsule Take  1 capsule  2 x /day  with Food  for Skin Infection (Patient not taking: Reported on 12/13/2022)   No current facility-administered medications on file prior to visit.     Allergies: No Known Allergies   Medical History:  Past Medical History:  Diagnosis Date   Diabetes mellitus without complication ([place])    Hyperlipidemia    Hypertension    Other testicular hypofunction    Prediabetes    Sleep apnea    Family history- Reviewed and unchanged Social history- Reviewed and unchanged   Review of Systems:   Review of Systems  Constitutional:  Negative for malaise/fatigue and weight loss.  HENT:  Negative for congestion, hearing loss and tinnitus.        Rhinitis  Eyes:  Negative for blurred vision and double vision.  Respiratory:  Negative for cough, shortness of breath and wheezing.   Cardiovascular:  Negative for chest pain, palpitations, orthopnea, claudication and leg swelling.  Gastrointestinal:  Negative for abdominal pain, blood in stool, constipation, diarrhea, heartburn, melena, nausea and vomiting.  Genitourinary: Negative.   Musculoskeletal:  Negative for joint pain and myalgias.  Skin:  Negative for rash.  Neurological:  Negative for dizziness, tingling, sensory change, weakness and headaches.  Endo/Heme/Allergies:  Negative for polydipsia.  Psychiatric/Behavioral: Negative.    All other systems reviewed and are negative.   Physical Exam: BP (!) 128/94   Pulse 97   Temp (!) 97.5 F (36.4 C)   Ht 5' 9" (1.753 m)   Wt 245 lb (111.1 kg)   SpO2 97%   BMI 36.18 kg/m  Wt Readings from Last 3 Encounters:  12/13/22 245 lb (111.1 kg)  08/29/22 233 lb 6.4 oz (105.9 kg)  05/11/22 220 lb (99.8 kg)   General Appearance: Well nourished, in no apparent distress. Eyes:  PERRLA, EOMs, conjunctiva no swelling or erythema Sinuses: No Frontal/maxillary tenderness ENT/Mouth: Ext aud canals clear, TMs without erythema, bulging. No erythema, swelling, or exudate on post pharynx.  Tonsils not swollen or erythematous. Hearing normal.  Neck: Supple, thyroid normal.  Respiratory: Respiratory effort normal, BS equal bilaterally without rales, rhonchi, wheezing or stridor.  Cardio: RRR with no MRGs. Brisk peripheral pulses without edema.  Abdomen: Soft, + BS.  Non tender, no guarding, rebound, hernias, masses. Lymphatics: Non tender without lymphadenopathy.  Musculoskeletal: Full ROM, 5/5 strength, Normal gait Skin: Posterior neck with scattered areas of circular raised hard bumps along neck  line.  Right posterior circular palpable soft mass approximately 25 cm in diameter non-tender to palpation. Neuro: Cranial nerves intact. No cerebellar symptoms.  Psych: Awake and oriented X 3, normal affect, Insight and Judgment appropriate.    Darrol Jump, NP 10:59 AM [facility]

## 2022-12-13 NOTE — Patient Instructions (Signed)
Folliculitis  Folliculitis occurs when hair follicles become inflamed. A hair follicle is a tiny opening in your skin where your hair grows from. This condition often occurs on the scalp, thighs, legs, back, and buttocks but can happen anywhere on the body. What are the causes? A common cause of this condition is an infection from bacteria. The type of folliculitis caused by bacteria can last a long time or go away and come back. The bacteria can live anywhere on your skin. They are often found in the nostrils. Other causes may include: An infection from a fungus. An infection from a virus. Your skin touching some chemicals, such as oils and tars. Shaving or waxing. Greasy ointments or creams put on the skin. What increases the risk? You are more likely to develop this condition if: Your body has a weak disease-fighting system (immune system). You have diabetes. You are obese. What are the signs or symptoms? Symptoms of this condition include: Redness. Soreness. Swelling. Itching. Small white or yellow, itchy spots filled with pus (pustules) that appear over a red area. If the infection goes deep into the follicle, these may turn into a boil (furuncle). A group of boils (carbuncle). These tend to form in hairy, sweaty areas of the body. How is this diagnosed? This condition is diagnosed with a skin exam. Your health care provider may take a sample of one of the pustules or boils to test in a lab. How is this treated? This condition may be treated by: Putting a warm, wet cloth (warm compress) on the affected areas. Taking antibiotics or applying them to the skin. Applying or bathing with a solution that kills germs (antiseptic). Taking an over-the-counter medicine. This can help with itching. Having a procedure to drain pustules or boils. This may be done if a pustule or boil contains a lot of pus or fluid. Having laser hair removal. This may be done when the condition lasts for a  long time. Follow these instructions at home: Managing pain and swelling  If directed, apply heat to the affected area as often as told by your health care provider. Use the heat source that your health care provider recommends, such as a moist heat pack or a heating pad. Place a towel between your skin and the heat source. Leave the heat on for 20-30 minutes. If your skin turns bright red, remove the heat right away to prevent burns. The risk of burns is higher if you cannot feel pain, heat, or cold. General instructions Take over-the-counter and prescription medicines only as told by your health care provider. If you were prescribed antibiotics, take or apply them as told by your health care provider. Do not stop using the antibiotic even if you start to feel better. Check your irritated area every day for signs of infection. Check for: More redness, swelling, or pain. Fluid or blood. Warmth. Pus or a bad smell. Do not shave irritated skin. Keep all follow-up visits. Your health care provider will check if the treatments are helping. Contact a health care provider if: You have a fever. You have any signs of infection. Red streaks are spreading from the affected area. This information is not intended to replace advice given to you by your health care provider. Make sure you discuss any questions you have with your health care provider. Document Revised: 05/01/2022 Document Reviewed: 05/01/2022 Elsevier Patient Education  2023 [employer]  

## 2022-12-14 LAB — LIPID PANEL
Cholesterol: 105 mg/dL (ref <200)
HDL: 34 mg/dL — ABNORMAL LOW (ref >=40)
LDL Cholesterol (Calc): 54 mg/dL (calc)
Non-HDL Cholesterol (Calc): 71 mg/dL (calc) (ref <130)
Total CHOL/HDL Ratio: 3.1 (calc) (ref <5.0)
Triglycerides: 83 mg/dL (ref <150)

## 2022-12-14 LAB — COMPLETE METABOLIC PANEL WITH GFR
AG Ratio: 1.9 (calc) (ref 1.0–2.5)
ALT: 30 U/L (ref 9–46)
AST: 35 U/L (ref 10–35)
Albumin: 4.5 g/dL (ref 3.6–5.1)
Alkaline phosphatase (APISO): 42 U/L (ref 35–144)
BUN: 12 mg/dL (ref 7–25)
CO2: 26 mmol/L (ref 20–32)
Calcium: 9 mg/dL (ref 8.6–10.3)
Chloride: 105 mmol/L (ref 98–110)
Creat: 1.04 mg/dL (ref 0.70–1.30)
Globulin: 2.4 g/dL (calc) (ref 1.9–3.7)
Glucose, Bld: 92 mg/dL (ref 65–139)
Potassium: 4.2 mmol/L (ref 3.5–5.3)
Sodium: 139 mmol/L (ref 135–146)
Total Bilirubin: 0.4 mg/dL (ref 0.2–1.2)
Total Protein: 6.9 g/dL (ref 6.1–8.1)
eGFR: 87 mL/min/{1.73_m2} (ref >=60)

## 2022-12-14 LAB — HEMOGLOBIN A1C
Hgb A1c MFr Bld: 5.5 % of total Hgb (ref <5.7)
Mean Plasma Glucose: 111 mg/dL
eAG (mmol/L): 6.2 mmol/L

## 2022-12-14 LAB — CBC WITH DIFFERENTIAL/PLATELET
Absolute Monocytes: 328 cells/uL (ref 200–950)
Basophils Absolute: 39 cells/uL (ref 0–200)
Basophils Relative: 1 %
Eosinophils Absolute: 70 cells/uL (ref 15–500)
Eosinophils Relative: 1.8 %
HCT: 48.1 % (ref 38.5–50.0)
Hemoglobin: 16.1 g/dL (ref 13.2–17.1)
Lymphs Abs: 1396 cells/uL (ref 850–3900)
MCH: 27.2 pg (ref 27.0–33.0)
MCHC: 33.5 g/dL (ref 32.0–36.0)
MCV: 81.1 fL (ref 80.0–100.0)
MPV: 9.9 fL (ref 7.5–12.5)
Monocytes Relative: 8.4 %
Neutro Abs: 2067 cells/uL (ref 1500–7800)
Neutrophils Relative %: 53 %
Platelets: 229 10*3/uL (ref 140–400)
RBC: 5.93 10*6/uL — ABNORMAL HIGH (ref 4.20–5.80)
RDW: 13.2 % (ref 11.0–15.0)
Total Lymphocyte: 35.8 %
WBC: 3.9 10*3/uL (ref 3.8–10.8)

## 2022-12-28 ENCOUNTER — Other Ambulatory Visit: Payer: Self-pay | Admitting: Internal Medicine

## 2022-12-28 MED ORDER — TIRZEPATIDE 12.5 MG/0.5ML ~~LOC~~ SOAJ: SUBCUTANEOUS | 0 refills | Status: DC

## 2023-01-03 ENCOUNTER — Ambulatory Visit (HOSPITAL_COMMUNITY)
Admission: RE | Admit: 2023-01-03 | Discharge: 2023-01-03 | Disposition: A | Discharge status: Home / Self Care | Payer: 59 | Source: Ambulatory Visit | Attending: Nurse Practitioner | Admitting: Nurse Practitioner

## 2023-01-03 DIAGNOSIS — R221 Localized swelling, mass and lump, neck: Secondary | ICD-10-CM | POA: Diagnosis not present

## 2023-02-05 ENCOUNTER — Encounter: Payer: 59 | Admitting: Internal Medicine

## 2023-02-21 ENCOUNTER — Other Ambulatory Visit: Payer: Self-pay | Admitting: Internal Medicine

## 2023-02-21 DIAGNOSIS — E1122 Type 2 diabetes mellitus with diabetic chronic kidney disease: Secondary | ICD-10-CM

## 2023-02-21 MED ORDER — TIRZEPATIDE 12.5 MG/0.5ML ~~LOC~~ SOAJ: SUBCUTANEOUS | 3 refills | Status: DC

## 2023-03-01 ENCOUNTER — Other Ambulatory Visit: Payer: Self-pay | Admitting: Internal Medicine

## 2023-03-01 DIAGNOSIS — E349 Endocrine disorder, unspecified: Secondary | ICD-10-CM

## 2023-03-01 MED ORDER — "SAFETY SYRINGES/NEEDLE 22G X 1-1/2"" 5 ML MISC": 1 refills | Status: DC

## 2023-03-18 ENCOUNTER — Other Ambulatory Visit: Payer: Self-pay | Admitting: Internal Medicine

## 2023-03-25 ENCOUNTER — Encounter: Payer: 59 | Admitting: Internal Medicine

## 2023-05-28 ENCOUNTER — Other Ambulatory Visit: Payer: Self-pay | Admitting: Internal Medicine

## 2023-06-02 ENCOUNTER — Encounter: Payer: Self-pay | Admitting: Internal Medicine

## 2023-06-02 NOTE — Progress Notes (Unsigned)
Annual  Screening/Preventative Visit  & Comprehensive Evaluation & Examination   Future Appointments  Date Time Provider Department  06/03/2023  3:00 PM Lucky Cowboy, MD [facility]  06/09/2024  3:00 PM Lucky Cowboy, MD [facility]             This very nice 52 y.o. MBM  with HTN, HLD, T2_NIDDM  and Vitamin D Deficiency  presents for a Screening /Preventative Visit & comprehensive evaluation and management of multiple medical co-morbidities.  Patient has hx/o OSA & is off CPAP since weight loss.        Patient has established dx/o  occipital nuchal Hidradenitis Suppurativa and has been on Doxycycline suppression and he repots that it seems to be getting worse.        Patient's HTN predates since  2007 . Patient's BP has been controlled at home.  Today's BP is at goal -   122/88 . Patient denies any cardiac symptoms as chest pain, palpitations, shortness of breath, dizziness or ankle swelling.       Patient's hyperlipidemia is controlled with diet and Rosuvastatin . Patient denies myalgias or other medication SE's. Last lipids were at goal except elevated Trig's :  Lab Results  Component Value Date   CHOL 105 12/13/2022   HDL 34 (L) 12/13/2022   LDLCALC 54 12/13/2022   TRIG 83 12/13/2022   CHOLHDL 3.1 12/13/2022         Patient has hx/o T2_NIDDM  (A1c 6.5% /2012)  and last A1c 6.7%  May  2022. In Oct 2022, he was started on [facility] and has successfully lost 45#  weight from 266#  ( BMI 37.4 )  down to current weight   220 #  ( BMI  32.49 ) .  Patient denies reactive hypoglycemic symptoms, visual blurring, diabetic polys or paresthesias. Last A1c was not at goal :   Lab Results  Component Value Date   HGBA1C 5.5 12/13/2022   Wt Readings from Last 3 Encounters:  06/03/23 220 lb (99.8 kg)  12/13/22 245 lb (111.1 kg)  08/29/22 233 lb 6.4 oz (105.9 kg)         Finally, patient has history of Vitamin D Deficiency ("9" /2009 )  and last vitamin D was at goal :   Lab  Results  Component Value Date   VD25OH 68 08/29/2022       Current Outpatient Medications  Medication Instructions   aspirin EC 81 mg, Oral, Daily   VITAMIN D  2,000 Int'l Units, Daily   ipratropium 0.06 % nasal spray Use 1 to 2 sprays  2 to 3 x /day as needed    losartan-hctz  50-12.5 MG tablet Take 1 tablet  daily    Multiple Vitamin  1 tablet, Oral, Daily   phentermine 37.5 MG tablet TAKE 1/2 TO 1 Tab  EVERY MORNING    rosuvastatin  40 MG tablet Take  1 tablet  Daily   tadalafil (CIALIS) 20 MG tablet Take 1/2 to 1 tablet every 2 to 3 days as needed   testosterone cypio  200 MG/ML injec INJECT 1 ML IM ONCE A WEEK   MOUNJARO 12.5 MG/0.5ML Pen Inject 1 pen  (12.5 mg) into Skin every 7 days    topiramate  50 MG tablet Take  1 tablet  2 x /day      No Known Allergies   Past Medical History:  Diagnosis Date   Diabetes mellitus without complication (HCC)    Hyperlipidemia  Hypertension    Other testicular hypofunction    Prediabetes    Sleep apnea      Health Maintenance  Topic Date Due   HIV Screening  Never done   Zoster Vaccines- Shingrix (1 of 2) Never done   COVID-19 Vaccine  12/16/2020   INFLUENZA VACCINE  07/10/2021   TETANUS/TDAP  09/18/2025   COLONOSCOPY  10/25/2031   Hepatitis C Screening  Completed   HPV VACCINES  Aged Out     Immunization History  Administered Date(s) Administered   Influenza Inj Mdck Quad  11/04/2017   Influenza Split 09/22/2014, 09/19/2015, 09/12/2016   Influenza 09/09/2018, 10/21/2019   PFIZER SARS-COV-2 Vacc 11/25/2020   PPD Test 12/17/2018, 01/05/2020, 01/19/2021   Td 10/13/2002   Tdap 09/19/2015    Last Colon - 10/24/2021 - Dr Barron Alvine  - Recommended 5 year  f/u due Dec 2027   Past Surgical History:  Procedure Laterality Date   ROTATOR CUFF REPAIR Right 2007   SHOULDER ARTHROSCOPY Right 2000     Family History  Problem Relation Age of Onset   Heart disease Father    Hyperlipidemia Father    Hypertension Father     Colon cancer Neg Hx    Esophageal cancer Neg Hx    Pancreatic cancer Neg Hx      Social History   Socioeconomic History   Marital status: Married      Spouse name: Cordelia Pen   Number of children: 1 son  Occupational History   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 Does c/o fatigue. Eyes: Denies redness, blurred vision, diplopia, discharge, itchy or watery eyes.  ENT: Denies discharge, congestion, post nasal drip, epistaxis, sore throat, earache, hearing loss, dental pain, Tinnitus, Vertigo, Sinus pain or snoring.  Cardio: Denies chest pain, palpitations, irregular heartbeat, syncope, dyspnea, diaphoresis, orthopnea, PND, claudication or edema Respiratory: denies cough, dyspnea, DOE, pleurisy, hoarseness, laryngitis or wheezing.  Gastrointestinal: Denies dysphagia, heartburn, reflux, water brash, pain, cramps, nausea, vomiting, bloating, diarrhea, constipation, hematemesis, melena, hematochezia, jaundice or hemorrhoids Genitourinary: Denies dysuria, frequency, urgency, nocturia, hesitancy, discharge, hematuria or flank pain Musculoskeletal: Denies arthralgia, myalgia, stiffness, Jt. Swelling, pain, limp or strain/sprain. Denies Falls. Skin: Denies puritis, rash, hives, warts, acne, eczema or change in skin lesion Neuro: No weakness, tremor, incoordination, spasms, paresthesia or pain Psychiatric: Denies confusion, memory loss or sensory loss. Denies Depression. Endocrine: Denies change in weight, skin, hair change, nocturia, and paresthesia, diabetic  polys, visual blurring or hyper / hypo glycemic episodes.  Heme/Lymph: No excessive bleeding, bruising or enlarged lymph nodes.   Physical Exam  BP 122/88   Pulse 94   Temp 97.9 F (36.6 C)   Resp 16   Ht 5\' 9"  (1.753 m)   Wt 220 lb (99.8 kg)   SpO2 98%   BMI 32.49 kg/m   General Appearance: Over nourished and well groomed and in no apparent distress.  Eyes: PERRLA, EOMs, conjunctiva no swelling or erythema, normal fundi and vessels. Sinuses: No frontal/maxillary tenderness  ENT/Mouth: EACs patent / TMs  nl. Nares clear without erythema, swelling, mucoid exudates. Oral hygiene is good. No erythema, swelling, or exudate. Tongue normal, non-obstructing. Tonsils not swollen or erythematous. Hearing normal.  Neck: Supple, thyroid not palpable. No bruits, nodes or JVD. Respiratory: Respiratory effort normal.  BS equal and clear bilateral without rales, rhonci, wheezing or stridor. Cardio: Heart sounds are normal with regular rate and rhythm and no murmurs, rubs or gallops. Peripheral pulses are normal and equal bilaterally without edema. No aortic or femoral bruits. Chest: symmetric with normal excursions and percussion.  Abdomen: Soft, with Nl bowel sounds. Nontender, no guarding, rebound, hernias, masses, or organomegaly.  Lymphatics: Non tender without lymphadenopathy.  Musculoskeletal: Full ROM all peripheral extremities, joint stability, 5/5 strength, and normal gait. Skin: Warm and dry without rashes,  cyanosis, clubbing or  ecchymosis. Posterior scalp /occipital /nuchal area with tender lumpy nodular cystic area of about 4" x 4" . Neuro: Cranial nerves intact, reflexes equal bilaterally. Normal muscle tone, no cerebellar symptoms. Sensation intact.  Pysch: Alert and oriented X 3 with normal affect, insight and judgment appropriate.   Assessment and Plan  1. Annual Preventative/Screening Exam    2. Essential hypertension  - CBC with Differential/Platelet - COMPLETE METABOLIC  PANEL WITH GFR - Magnesium - TSH - EKG 12-Lead - [place], RETROPERITNL ABD,  LTD - Urinalysis, Routine w reflex microscopic - Microalbumin / creatinine urine ratio  3. Hyperlipidemia associated with type 2 diabetes mellitus (HCC)  - Lipid panel - TSH - EKG 12-Lead - [place], RETROPERITNL ABD,  LTD  4. Type 2 diabetes mellitus with stage 2 chronic kidney                                                disease, without long-term current use of insulin (HCC)  - Hemoglobin A1c - Insulin, random - EKG 12-Lead - [place], RETROPERITNL ABD,  LTD - Urinalysis, Routine w reflex microscopic - Microalbumin / creatinine urine ratio - HM DIABETES FOOT EXAM - PR LOW EXTEMITY NEUR EXAM DOCUM  5. Class 2 severe obesity due to excess calories with serious                                             comorbidity and body mass index (BMI) of 32.49in adult (HCC)  - TSH  6. Vitamin D deficiency  - VITAMIN D 25 Hydroxy   7. Testosterone deficiency  - Testosterone  8. OSA on CPAP   9. Screening examination for pulmonary tuberculosis  - TB Skin Test  10. Screening for colorectal cancer  - POC Hemoccult Bld/Stl   11. Prostate cancer screening  - PSA  12. Screening for ischemic heart disease  - EKG 12-Lead  13. FHx: heart disease  - EKG 12-Lead - [place], RETROPERITNL ABD,  LTD  14. Screening for AAA (aortic abdominal aneurysm)  - [place], RETROPERITNL ABD,  LTD  15. Fatigue,  - CBC with Differential/Platelet - TSH - Iron, Total/Total Iron Binding Cap - Vitamin B12  16. Medication management  - CBC with Differential/Platelet - COMPLETE METABOLIC PANEL WITH GFR - Magnesium - Lipid panel - TSH - Hemoglobin A1c - Insulin, random - VITAMIN D 25 Hydroxy  - Testosterone - Urinalysis, Routine w  reflex microscopic - Microalbumin / creatinine urine ratio  17.  Hidradenitis Suppurativa  - Switch Abx to gKeflex 500  mg tid and also recommended trial on Tumeric capsules.          Patient  was counseled in prudent diet, weight control to achieve/maintain BMI less than 25, BP monitoring, regular exercise and medications as discussed.  Discussed med effects and SE's. Routine screening labs and tests as requested with regular follow-up as recommended. Over 40 minutes of exam, counseling, chart review and high complex critical decision making was performed   Marinus Maw, MD

## 2023-06-02 NOTE — Patient Instructions (Signed)
Due to recent changes in healthcare laws, you may see the results of your imaging and laboratory studies on MyChart before your provider has had a chance to review them.  We understand that in some cases there may be results that are confusing or concerning to you. Not all laboratory results come back in the same time frame and the provider may be waiting for multiple results in order to interpret others.  Please give us 48 hours in order for your provider to thoroughly review all the results before contacting the office for clarification of your results.   ++++++++++++++++++++++++++++++++++++++  Vit D  & Vit C 1,000 mg   are recommended to help protect  against the Covid-19 and other Corona viruses.    Also it's recommended  to take  Zinc 50 mg  to help  protect against the Covid-19   and best place to get  is also on [employer]  and don't pay more than 6-8 cents /pill !  =============================== Coronavirus (COVID-19) Are you at risk?  Are you at risk for the Coronavirus (COVID-19)?  To be considered HIGH RISK for Coronavirus (COVID-19), you have to meet the following criteria:  Traveled to [place], [place], [place], [place] or [place]; or in the [place] to [place], [place], [place]  or [place]; and have fever, cough, and shortness of breath within the last 2 weeks of travel OR Been in close contact with a person diagnosed with COVID-19 within the last 2 weeks and have  fever, cough,and shortness of breath  IF YOU DO NOT MEET THESE CRITERIA, YOU ARE CONSIDERED LOW RISK FOR COVID-19.  What to do if you are HIGH RISK for COVID-19?  If you are having a medical emergency, call 911. Seek medical care right away. Before you go to a doctor's office, urgent care or emergency department,  call ahead and tell them about your recent travel, contact with someone diagnosed with COVID-19   and your symptoms.  You should receive instructions from your physician's office  regarding next steps of care.  When you arrive at healthcare provider, tell the healthcare staff immediately you have returned from  visiting [place], [place], [place], [place] or [place]; or traveled in the [place] to [place], [place],  [place] or [place] in the last two weeks or you have been in close contact with a person diagnosed with  COVID-19 in the last 2 weeks.   Tell the health care staff about your symptoms: fever, cough and shortness of breath. After you have been seen by a medical provider, you will be either: Tested for (COVID-19) and discharged home on quarantine except to seek medical care if  symptoms worsen, and asked to  Stay home and avoid contact with others until you get your results (4-5 days)  Avoid travel on public transportation if possible (such as bus, train, or airplane) or Sent to the Emergency Department by EMS for evaluation, COVID-19 testing  and  possible admission depending on your condition and test results.  What to do if you are LOW RISK for COVID-19?  Reduce your risk of any infection by using the same precautions used for avoiding the common cold or flu:  Wash your hands often with soap and warm water for at least 20 seconds.  If soap and water are not readily available,  use an alcohol-based hand sanitizer with at least 60% alcohol.  If coughing or sneezing, cover your mouth and nose by coughing   or sneezing into the elbow areas of your shirt or coat,  into a tissue or into your sleeve (not your hands). Avoid shaking hands with others and consider head nods or verbal greetings only. Avoid touching your eyes, nose, or mouth with unwashed hands.  Avoid close contact with people who are sick. Avoid places or events with large numbers of people in one location, like concerts or sporting events. Carefully consider travel plans you have or are making. If you are planning any travel outside or inside the [place], visit the CDC's Travelers' Health  webpage for the latest health notices. If you have some symptoms but not all symptoms, continue to monitor at home and seek medical attention  if your symptoms worsen. If you are having a medical emergency, call 911. >>>>>>>>>>>>>>>>>>>>>>>>>>>> Preventive Care for Adults  A healthy lifestyle and preventive care can promote health and wellness. Preventive health guidelines for men include the following key practices: A routine yearly physical is a good way to check with your health care provider about your health and preventative screening. It is a chance to share any concerns and updates on your health and to receive a thorough exam. Visit your dentist for a routine exam and preventative care every 6 months. Brush your teeth twice a day and floss once a day. Good oral hygiene prevents tooth decay and gum disease. The frequency of eye exams is based on your age, health, family medical history, use of contact lenses, and other factors. Follow your health care provider's recommendations for frequency of eye exams. Eat a healthy diet. Foods such as vegetables, fruits, whole grains, low-fat dairy products, and lean protein foods contain the nutrients you need without too many calories. Decrease your intake of foods high in solid fats, added sugars, and salt. Eat the right amount of calories for you. Get information about a proper diet from your health care provider, if necessary. Regular physical exercise is one of the most important things you can do for your health. Most adults should get at least 150 minutes of moderate-intensity exercise (any activity that increases your heart rate and causes you to sweat) each week. In addition, most adults need muscle-strengthening exercises on 2 or more days a week. Maintain a healthy weight. The body mass index (BMI) is a screening tool to identify possible weight problems. It provides an estimate of body fat based on height and weight. Your health care provider can  find your BMI and can help you achieve or maintain a healthy weight. For adults 20 years and older: A BMI below 18.5 is considered underweight. A BMI of 18.5 to 24.9 is normal. A BMI of 25 to 29.9 is considered overweight. A BMI of 30 and above is considered obese. Maintain normal blood lipids and cholesterol levels by exercising and minimizing your intake of saturated fat. Eat a balanced diet with plenty of fruit and vegetables. Blood tests for lipids and cholesterol should begin at age 20 and be repeated every 5 years. If your lipid or cholesterol levels are high, you are over 50, or you are at high risk for heart disease, you may need your cholesterol levels checked more frequently. Ongoing high lipid and cholesterol levels should be treated with medicines if diet and exercise are not working. If you smoke, find out from your health care provider how to quit. If you do not use tobacco, do not start. Lung cancer screening is recommended for adults aged 55-80 years who are at high risk for   developing lung cancer because of a history of smoking. A yearly low-dose CT scan of the lungs is recommended for people who have at least a 30-pack-year history of smoking and are a current smoker or have quit within the past 15 years. A pack year of smoking is smoking an average of 1 pack of cigarettes a day for 1 year (for example: 1 pack a day for 30 years or 2 packs a day for 15 years). Yearly screening should continue until the smoker has stopped smoking for at least 15 years. Yearly screening should be stopped for people who develop a health problem that would prevent them from having lung cancer treatment. If you choose to drink alcohol, do not have more than 2 drinks per day. One drink is considered to be 12 ounces (355 mL) of beer, 5 ounces (148 mL) of wine, or 1.5 ounces (44 mL) of liquor. Avoid use of street drugs. Do not share needles with anyone. Ask for help if you need support or instructions about  stopping the use of drugs. High blood pressure causes heart disease and increases the risk of stroke. Your blood pressure should be checked at least every 1-2 years. Ongoing high blood pressure should be treated with medicines, if weight loss and exercise are not effective. If you are 12-53 years old, ask your health care provider if you should take aspirin to prevent heart disease. Diabetes screening involves taking a blood sample to check your fasting blood sugar level. This should be done once every 3 years, after age 70, if you are within normal weight and without risk factors for diabetes. Testing should be considered at a younger age or be carried out more frequently if you are overweight and have at least 1 risk factor for diabetes. Colorectal cancer can be detected and often prevented. Most routine colorectal cancer screening begins at the age of 46 and continues through age 55. However, your health care provider may recommend screening at an earlier age if you have risk factors for colon cancer. On a yearly basis, your health care provider may provide home test kits to check for hidden blood in the stool. Use of a small camera at the end of a tube to directly examine the colon (sigmoidoscopy or colonoscopy) can detect the earliest forms of colorectal cancer. Talk to your health care provider about this at age 45, when routine screening begins. Direct exam of the colon should be repeated every 5-10 years through age 35, unless early forms of precancerous polyps or small growths are found.  Talk with your health care provider about prostate cancer screening. Testicular cancer screening isrecommended for adult males. Screening includes self-exam, a health care provider exam, and other screening tests. Consult with your health care provider about any symptoms you have or any concerns you have about testicular cancer. Use sunscreen. Apply sunscreen liberally and repeatedly throughout the day. You should  seek shade when your shadow is shorter than you. Protect yourself by wearing long sleeves, pants, a wide-brimmed hat, and sunglasses year round, whenever you are outdoors. Once a month, do a whole-body skin exam, using a mirror to look at the skin on your back. Tell your health care provider about new moles, moles that have irregular borders, moles that are larger than a pencil eraser, or moles that have changed in shape or color. Stay current with required vaccines (immunizations). Influenza vaccine. All adults should be immunized every year. Tetanus, diphtheria, and acellular pertussis (Td, Tdap) vaccine. An  adult who has not previously received Tdap or who does not know his vaccine status should receive 1 dose of Tdap. This initial dose should be followed by tetanus and diphtheria toxoids (Td) booster doses every 10 years. Adults with an unknown or incomplete history of completing a 3-dose immunization series with Td-containing vaccines should begin or complete a primary immunization series including a Tdap dose. Adults should receive a Td booster every 10 years. Varicella vaccine. An adult without evidence of immunity to varicella should receive 2 doses or a second dose if he has previously received 1 dose. Human papillomavirus (HPV) vaccine. Males aged 13-21 years who have not received the vaccine previously should receive the 3-dose series. Males aged 22-26 years may be immunized. Immunization is recommended through the age of 26 years for any male who has sex with males and did not get any or all doses earlier. Immunization is recommended for any person with an immunocompromised condition through the age of 26 years if he did not get any or all doses earlier. During the 3-dose series, the second dose should be obtained 4-8 weeks after the first dose. The third dose should be obtained 24 weeks after the first dose and 16 weeks after the second dose. Zoster vaccine. One dose is recommended for adults  aged 60 years or older unless certain conditions are present.  PREVNAR  - Pneumococcal 13-valent conjugate (PCV13) vaccine. When indicated, a person who is uncertain of his immunization history and has no record of immunization should receive the PCV13 vaccine. An adult aged 19 years or older who has certain medical conditions and has not been previously immunized should receive 1 dose of PCV13 vaccine. This PCV13 should be followed with a dose of pneumococcal polysaccharide (PPSV23) vaccine. The PPSV23 vaccine dose should be obtained at least 1 r more year(s) after the dose of PCV13 vaccine. An adult aged 19 years or older who has certain medical conditions and previously received 1 or more doses of PPSV23 vaccine should receive 1 dose of PCV13. The PCV13 vaccine dose should be obtained 1 or more years after the last PPSV23 vaccine dose.  PNEUMOVAX - Pneumococcal polysaccharide (PPSV23) vaccine. When PCV13 is also indicated, PCV13 should be obtained first. All adults aged 65 years and older should be immunized. An adult younger than age 65 years who has certain medical conditions should be immunized. Any person who resides in a nursing home or long-term care facility should be immunized. An adult smoker should be immunized. People with an immunocompromised condition and certain other conditions should receive both PCV13 and PPSV23 vaccines. People with human immunodeficiency virus (HIV) infection should be immunized as soon as possible after diagnosis. Immunization during chemotherapy or radiation therapy should be avoided. Routine use of PPSV23 vaccine is not recommended for American Indians, [place] Natives, or people younger than 65 years unless there are medical conditions that require PPSV23 vaccine. When indicated, people who have unknown immunization and have no record of immunization should receive PPSV23 vaccine. One-time revaccination 5 years after the first dose of PPSV23 is recommended for people  aged 19-64 years who have chronic kidney failure, nephrotic syndrome, asplenia, or immunocompromised conditions. People who received 1-2 doses of PPSV23 before age 65 years should receive another dose of PPSV23 vaccine at age 65 years or later if at least 5 years have passed since the previous dose. Doses of PPSV23 are not needed for people immunized with PPSV23 at or after age 65 years.  Hepatitis A vaccine.   Adults who wish to be protected from this disease, have certain high-risk conditions, work with hepatitis A-infected animals, work in hepatitis A research labs, or travel to or work in countries with a high rate of hepatitis A should be immunized. Adults who were previously unvaccinated and who anticipate close contact with an international adoptee during the first 60 days after arrival in the [place] from a country with a high rate of hepatitis A should be immunized.  Hepatitis B vaccine. Adults should be immunized if they wish to be protected from this disease, have certain high-risk conditions, may be exposed to blood or other infectious body fluids, are household contacts or sex partners of hepatitis B positive people, are clients or workers in certain care facilities, or travel to or work in countries with a high rate of hepatitis B.  Preventive Service / Frequency  Ages 40 to 64 Blood pressure check. Lipid and cholesterol check Lung cancer screening. / Every year if you are aged 55-80 years and have a 30-pack-year history of smoking and currently smoke or have quit within the past 15 years. Yearly screening is stopped once you have quit smoking for at least 15 years or develop a health problem that would prevent you from having lung cancer treatment. Fecal occult blood test (FOBT) of stool. / Every year beginning at age 50 and continuing until age 75. You may not have to do this test if you get a colonoscopy every 10 years. Flexible sigmoidoscopy** or colonoscopy.** / Every 5 years for  a flexible sigmoidoscopy or every 10 years for a colonoscopy beginning at age 50 and continuing until age 75. Screening for abdominal aortic aneurysm (AAA)  by ultrasound is recommended for people who have history of high blood pressure or who are current or former smokers. +++++++++++ Recommend Adult Low Dose Aspirin or  coated  Aspirin 81 mg daily  To reduce risk of Colon Cancer 40 %,  Skin Cancer 26 % ,  Malignant Melanoma 46%  and  Pancreatic cancer 60% ++++++++++++++++++++ Vitamin D goal  is between 70-100.  Please make sure that you are taking your Vitamin D as directed.  It is very important as a natural anti-inflammatory  helping hair, skin, and nails, as well as reducing stroke and heart attack risk.  It helps your bones and helps with mood. It also decreases numerous cancer risks so please take it as directed.  Low Vit D is associated with a 200-300% higher risk for CANCER  and 200-300% higher risk for HEART   ATTACK  &  STROKE.   ...................................... It is also associated with higher death rate at younger ages,  autoimmune diseases like Rheumatoid arthritis, Lupus, Multiple Sclerosis.    Also many other serious conditions, like depression, Alzheimer's Dementia, infertility, muscle aches, fatigue, fibromyalgia - just to name a few. +++++++++++++++++++++ Recommend the book "The END of DIETING" by Dr Joel Fuhrman  & the book "The END of DIABETES " by Dr Joel Fuhrman At [employer] - get book & Audio CD's    Being diabetic has a  300% increased risk for heart attack, stroke, cancer, and alzheimer- type vascular dementia. It is very important that you work harder with diet by avoiding all foods that are white. Avoid white rice (brown & wild rice is OK), white potatoes (sweetpotatoes in moderation is OK), White bread or wheat bread or anything made out of white flour like bagels, donuts, rolls, buns, biscuits, cakes, pastries, cookies, pizza crust, and pasta (made    from white flour & egg whites) - vegetarian pasta or spinach or wheat pasta is OK. Multigrain breads like Arnold's or [employer], or multigrain sandwich thins or flatbreads.  Diet, exercise and weight loss can reverse and cure diabetes in the early stages.  Diet, exercise and weight loss is very important in the control and prevention of complications of diabetes which affects every system in your body, ie. Brain - dementia/stroke, eyes - glaucoma/blindness, heart - heart attack/heart failure, kidneys - dialysis, stomach - gastric paralysis, intestines - malabsorption, nerves - severe painful neuritis, circulation - gangrene & loss of a leg(s), and finally cancer and Alzheimers. ° °  I recommend avoid fried & greasy foods,  sweets/candy, white rice (brown or wild rice or Quinoa is OK), white potatoes (sweet potatoes are OK) - anything made from white flour - bagels, doughnuts, rolls, buns, biscuits,white and wheat breads, pizza crust and traditional pasta made of white flour & egg white(vegetarian pasta or spinach or wheat pasta is OK).  Multi-grain bread is OK - like multi-grain flat bread or sandwich thins. Avoid alcohol in excess. Exercise is also important. ° °  Eat all the vegetables you want - avoid meat, especially red meat and dairy - especially cheese.  Cheese is the most concentrated form of trans-fats which is the worst thing to clog up our arteries. Veggie cheese is OK which can be found in the fresh produce section at [employer] or [employer] or [employer] ° °++++++++++++++++++++++ °DASH Eating Plan ° °DASH stands for "Dietary Approaches to Stop Hypertension."  ° °The DASH eating plan is a healthy eating plan that has been shown to reduce high blood pressure (hypertension). Additional health benefits may include reducing the risk of type 2 diabetes mellitus, heart disease, and stroke. The DASH eating plan may also help with weight loss. °WHAT DO I NEED TO KNOW ABOUT THE DASH EATING PLAN? °For  the DASH eating plan, you will follow these general guidelines: °Choose foods with a percent daily value for sodium of less than 5% (as listed on the food label). °Use salt-free seasonings or herbs instead of table salt or sea salt. °Check with your health care provider or pharmacist before using salt substitutes. °Eat lower-sodium products, often labeled as "lower sodium" or "no salt added." °Eat fresh foods. °Eat more vegetables, fruits, and low-fat dairy products. °Choose whole grains. Look for the word "whole" as the first word in the ingredient list. °Choose fish  °Limit sweets, desserts, sugars, and sugary drinks. °Choose heart-healthy fats. °Eat veggie cheese  °Eat more home-cooked food and less restaurant, buffet, and fast food. °Limit fried foods. °Cook foods using methods other than frying. °Limit canned vegetables. If you do use them, rinse them well to decrease the sodium. °When eating at a restaurant, ask that your food be prepared with less salt, or no salt if possible. °                  °   WHAT FOODS CAN I EAT? °Read Dr Joel Fuhrman's books on The End of Dieting & The End of Diabetes ° °Grains °Whole grain or whole wheat bread. Brown rice. Whole grain or whole wheat pasta. Quinoa, bulgur, and whole grain cereals. Low-sodium cereals. Corn or whole wheat flour tortillas. Whole grain cornbread. Whole grain crackers. Low-sodium crackers. ° °Vegetables °Fresh or frozen vegetables (raw, steamed, roasted, or grilled). Low-sodium or reduced-sodium tomato and vegetable juices. Low-sodium or reduced-sodium tomato sauce and paste. Low-sodium or reduced-sodium canned vegetables.  ° °  Fruits °All fresh, canned (in natural juice), or frozen fruits. ° °Protein Products ° All fish and seafood.  Dried beans, peas, or lentils. Unsalted nuts and seeds. Unsalted canned beans. ° °Dairy °Low-fat dairy products, such as skim or 1% milk, 2% or reduced-fat cheeses, low-fat ricotta or cottage cheese, or plain low-fat yogurt.  Low-sodium or reduced-sodium cheeses. ° °Fats and Oils °Tub margarines without trans fats. Light or reduced-fat mayonnaise and salad dressings (reduced sodium). Avocado. Safflower, olive, or canola oils. Natural peanut or almond butter. ° °Other °Unsalted popcorn and pretzels. °The items listed above may not be a complete list of recommended foods or beverages. Contact your dietitian for more options. ° °+++++++++++++++++++ ° °WHAT FOODS ARE NOT RECOMMENDED? °Grains/ White flour or wheat flour °White bread. White pasta. White rice. Refined cornbread. Bagels and croissants. Crackers that contain trans fat. ° °Vegetables ° °Creamed or fried vegetables. Vegetables in a . Regular canned vegetables. Regular canned tomato sauce and paste. Regular tomato and vegetable juices. ° °Fruits °Dried fruits. Canned fruit in light or heavy syrup. Fruit juice. ° °Meat and Other Protein Products °Meat in general - RED meat & White meat.  Fatty cuts of meat. Ribs, chicken wings, all processed meats as bacon, sausage, bologna, salami, fatback, hot dogs, bratwurst and packaged luncheon meats. ° °Dairy °Whole or 2% milk, cream, half-and-half, and cream cheese. Whole-fat or sweetened yogurt. Full-fat cheeses or blue cheese. Non-dairy creamers and whipped toppings. Processed cheese, cheese spreads, or cheese curds. ° °Condiments °Onion and garlic salt, seasoned salt, table salt, and sea salt. Canned and packaged gravies. Worcestershire sauce. Tartar sauce. Barbecue sauce. Teriyaki sauce. Soy sauce, including reduced sodium. Steak sauce. Fish sauce. Oyster sauce. Cocktail sauce. Horseradish. Ketchup and mustard. Meat flavorings and tenderizers. Bouillon cubes. Hot sauce. Tabasco sauce. Marinades. Taco seasonings. Relishes. ° °Fats and Oils °Butter, stick margarine, lard, shortening and bacon fat. Coconut, palm kernel, or palm oils. Regular salad dressings. ° °Pickles and olives. Salted popcorn and pretzels. ° °The items listed above may not  be a complete list of foods and beverages to avoid. ° ° °

## 2023-06-03 ENCOUNTER — Ambulatory Visit (INDEPENDENT_AMBULATORY_CARE_PROVIDER_SITE_OTHER): Payer: 59 | Admitting: Internal Medicine

## 2023-06-03 ENCOUNTER — Encounter: Payer: Self-pay | Admitting: Internal Medicine

## 2023-06-03 VITALS — BP 122/88 | HR 94 | Temp 97.9°F | Resp 16 | Ht 69.0 in | Wt 220.0 lb

## 2023-06-03 DIAGNOSIS — Z136 Encounter for screening for cardiovascular disorders: Secondary | ICD-10-CM | POA: Diagnosis not present

## 2023-06-03 DIAGNOSIS — Z111 Encounter for screening for respiratory tuberculosis: Secondary | ICD-10-CM

## 2023-06-03 DIAGNOSIS — L732 Hidradenitis suppurativa: Secondary | ICD-10-CM

## 2023-06-03 DIAGNOSIS — E559 Vitamin D deficiency, unspecified: Secondary | ICD-10-CM

## 2023-06-03 DIAGNOSIS — Z0000 Encounter for general adult medical examination without abnormal findings: Secondary | ICD-10-CM | POA: Diagnosis not present

## 2023-06-03 DIAGNOSIS — R5383 Other fatigue: Secondary | ICD-10-CM

## 2023-06-03 DIAGNOSIS — E349 Endocrine disorder, unspecified: Secondary | ICD-10-CM

## 2023-06-03 DIAGNOSIS — E1169 Type 2 diabetes mellitus with other specified complication: Secondary | ICD-10-CM

## 2023-06-03 DIAGNOSIS — Z79899 Other long term (current) drug therapy: Secondary | ICD-10-CM

## 2023-06-03 DIAGNOSIS — I10 Essential (primary) hypertension: Secondary | ICD-10-CM

## 2023-06-03 DIAGNOSIS — Z8249 Family history of ischemic heart disease and other diseases of the circulatory system: Secondary | ICD-10-CM

## 2023-06-03 DIAGNOSIS — G4733 Obstructive sleep apnea (adult) (pediatric): Secondary | ICD-10-CM

## 2023-06-03 DIAGNOSIS — Z125 Encounter for screening for malignant neoplasm of prostate: Secondary | ICD-10-CM

## 2023-06-03 DIAGNOSIS — I700 Atherosclerosis of aorta: Secondary | ICD-10-CM | POA: Diagnosis not present

## 2023-06-03 DIAGNOSIS — Z1211 Encounter for screening for malignant neoplasm of colon: Secondary | ICD-10-CM

## 2023-06-03 DIAGNOSIS — Z0001 Encounter for general adult medical examination with abnormal findings: Secondary | ICD-10-CM

## 2023-06-03 DIAGNOSIS — E1122 Type 2 diabetes mellitus with diabetic chronic kidney disease: Secondary | ICD-10-CM

## 2023-06-03 MED ORDER — "SAFETY SYRINGES/NEEDLE 22G X 1-1/2"" 5 ML MISC": 1 refills | Status: DC

## 2023-06-03 MED ORDER — CEPHALEXIN 500 MG PO CAPS: ORAL_CAPSULE | ORAL | 1 refills | Status: DC

## 2023-06-03 MED ORDER — TIRZEPATIDE 15 MG/0.5ML ~~LOC~~ SOAJ
SUBCUTANEOUS | 3 refills | Status: DC
Start: 2023-06-03 — End: 2024-02-26

## 2023-06-04 LAB — COMPLETE METABOLIC PANEL WITH GFR
AG Ratio: 2 (calc) (ref 1.0–2.5)
ALT: 32 U/L (ref 9–46)
AST: 35 U/L (ref 10–35)
Albumin: 4.7 g/dL (ref 3.6–5.1)
Alkaline phosphatase (APISO): 49 U/L (ref 35–144)
BUN: 13 mg/dL (ref 7–25)
CO2: 23 mmol/L (ref 20–32)
Calcium: 9.3 mg/dL (ref 8.6–10.3)
Chloride: 106 mmol/L (ref 98–110)
Creat: 1.28 mg/dL (ref 0.70–1.30)
Globulin: 2.4 g/dL (calc) (ref 1.9–3.7)
Glucose, Bld: 98 mg/dL (ref 65–99)
Potassium: 4.1 mmol/L (ref 3.5–5.3)
Sodium: 138 mmol/L (ref 135–146)
Total Bilirubin: 0.5 mg/dL (ref 0.2–1.2)
Total Protein: 7.1 g/dL (ref 6.1–8.1)
eGFR: 68 mL/min/{1.73_m2} (ref >=60)

## 2023-06-04 LAB — CBC WITH DIFFERENTIAL/PLATELET
Absolute Monocytes: 639 cells/uL (ref 200–950)
Basophils Absolute: 69 cells/uL (ref 0–200)
Basophils Relative: 0.9 %
Eosinophils Absolute: 193 cells/uL (ref 15–500)
Eosinophils Relative: 2.5 %
HCT: 49.5 % (ref 38.5–50.0)
Hemoglobin: 16.4 g/dL (ref 13.2–17.1)
Lymphs Abs: 2649 cells/uL (ref 850–3900)
MCH: 26.4 pg — ABNORMAL LOW (ref 27.0–33.0)
MCHC: 33.1 g/dL (ref 32.0–36.0)
MCV: 79.7 fL — ABNORMAL LOW (ref 80.0–100.0)
MPV: 9.4 fL (ref 7.5–12.5)
Monocytes Relative: 8.3 %
Neutro Abs: 4150 cells/uL (ref 1500–7800)
Neutrophils Relative %: 53.9 %
Platelets: 229 10*3/uL (ref 140–400)
RBC: 6.21 10*6/uL — ABNORMAL HIGH (ref 4.20–5.80)
RDW: 16.1 % — ABNORMAL HIGH (ref 11.0–15.0)
Total Lymphocyte: 34.4 %
WBC: 7.7 10*3/uL (ref 3.8–10.8)

## 2023-06-04 LAB — LIPID PANEL
Cholesterol: 124 mg/dL (ref <200)
HDL: 34 mg/dL — ABNORMAL LOW (ref >=40)
LDL Cholesterol (Calc): 69 mg/dL (calc)
Non-HDL Cholesterol (Calc): 90 mg/dL (calc) (ref <130)
Total CHOL/HDL Ratio: 3.6 (calc) (ref <5.0)
Triglycerides: 129 mg/dL (ref <150)

## 2023-06-04 LAB — HEMOGLOBIN A1C
Hgb A1c MFr Bld: 6 % of total Hgb — ABNORMAL HIGH (ref <5.7)
Mean Plasma Glucose: 126 mg/dL
eAG (mmol/L): 7 mmol/L

## 2023-06-04 LAB — URINALYSIS, ROUTINE W REFLEX MICROSCOPIC
Bilirubin Urine: NEGATIVE
Glucose, UA: NEGATIVE
Hgb urine dipstick: NEGATIVE
Leukocytes,Ua: NEGATIVE
Nitrite: NEGATIVE
Protein, ur: NEGATIVE
Specific Gravity, Urine: 1.026 (ref 1.001–1.035)
pH: 5.5 (ref 5.0–8.0)

## 2023-06-04 LAB — VITAMIN D 25 HYDROXY (VIT D DEFICIENCY, FRACTURES): Vit D, 25-Hydroxy: 82 ng/mL (ref 30–100)

## 2023-06-04 LAB — IRON, TOTAL/TOTAL IRON BINDING CAP
%SAT: 36 % (calc) (ref 20–48)
Iron: 101 ug/dL (ref 50–180)
TIBC: 279 mcg/dL (calc) (ref 250–425)

## 2023-06-04 LAB — MICROALBUMIN / CREATININE URINE RATIO
Creatinine, Urine: 237 mg/dL (ref 20–320)
Microalb Creat Ratio: 3 mg/g creat (ref <30)
Microalb, Ur: 0.7 mg/dL

## 2023-06-04 LAB — TESTOSTERONE: Testosterone: 1947 ng/dL — ABNORMAL HIGH (ref 250–827)

## 2023-06-04 LAB — TSH: TSH: 0.7 mIU/L (ref 0.40–4.50)

## 2023-06-04 LAB — VITAMIN B12: Vitamin B-12: 770 pg/mL (ref 200–1100)

## 2023-06-04 LAB — PSA: PSA: 0.38 ng/mL (ref <=4.00)

## 2023-06-04 LAB — INSULIN, RANDOM: Insulin: 37 u[IU]/mL — ABNORMAL HIGH

## 2023-06-04 LAB — MAGNESIUM: Magnesium: 1.9 mg/dL (ref 1.5–2.5)

## 2023-06-04 NOTE — Progress Notes (Signed)
^<^<^<^<^<^<^<^<^<^<^<^<^<^<^<^<^<^<^<^<^<^<^<^<^<^<^<^<^<^<^<^<^<^<^<^<^ ^>^>^>^>^>^>^>^>^>^>^>>^>^>^>^>^>^>^>^>^>^>^>^>^>^>^>^>^>^>^>^>^>^>^>^>^>  -  Test results slightly outside the reference range are not unusual. If there is anything important, I will review this with you,  otherwise it is considered normal test values.  If you have further questions,  please do not hesitate to contact me at the office or via My Chart.   ^<^<^<^<^<^<^<^<^<^<^<^<^<^<^<^<^<^<^<^<^<^<^<^<^<^<^<^<^<^<^<^<^<^<^<^<^ ^>^>^>^>^>^>^>^>^>^>^>^>^>^>^>^>^>^>^>^>^>^>^>^>^>^>^>^>^>^>^>^>^>^>^>^>^  -  Testosterone very elevated from recent shot ,   So suggest decrease doe Depo-Testosterone to 1/2 ML every 7 days.   ( Too high Testosterone levels increases clotability of blood  ( just like estrogens) &   Therefore increases risk for Heart Attack, Stroke, blood clots to legs or lungs &   Elevated testosterone levels Also increases risk for Prostate cancer.   ^>^>^>^>^>^>^>^>^>^>^>^>^>^>^>^>^>^>^>^>^>^>^>^>^>^>^>^>^>^>^>^>^>^>^>^>^  -  Insulin level is high & is a sign of Insulin Resistance ( Which is not good )   ^>^>^>^>^>^>^>^>^>^>^>^>^>^>^>^>^>^>^>^>^>^>^>^>^>^>^>^>^>^>^>^>^>^>^>^>^  - Chol = 124  &  LDL = 69   Excellent   - Very low risk for Heart Attack  / Stroke  ^>^>^>^>^>^>^>^>^>^>^>^>^>^>^>^>^>^>^>^>^>^>^>^>^>^>^>^>^>^>^>^>^>^>^>^>^ ^>^>^>^>^>^>^>^>^>^>^>^>^>^>^>^>^>^>^>^>^>^>^>^>^>^>^>^>^>^>^>^>^>^>^>^>^  -  A1c - is   back up again from  Normal 5.5%  to now  6.0%  ^>^>^>^>^>^>^>^>^>^>^>^>^>^>^>^>^>^>^>^>^>^>^>^>^>^>^>^>^>^>^>^>^>^>^>^>^  - Iron & Vit  B12 levels are Normal - Great   ^>^>^>^>^>^>^>^>^>^>^>^>^>^>^>^>^>^>^>^>^>^>^>^>^>^>^>^>^>^>^>^>^>^>^>^>^  - PSA is Low - No Prostate Cancer  - Great !  ^>^>^>^>^>^>^>^>^>^>^>^>^>^>^>^>^>^>^>^>^>^>^>^>^>^>^>^>^>^>^>^>^>^>^>^>^  -  Vitamin D = 82  - Excellent  !  ^>^>^>^>^>^>^>^>^>^>^>^>^>^>^>^>^>^>^>^>^>^>^>^>^>^>^>^>^>^>^>^>^>^>^>^>^  -   All Else - CBC - Kidneys - Electrolytes - Liver - Magnesium & Thyroid    - all  Normal / OK  ^>^>^>^>^>^>^>^>^>^>^>^>^>^>^>^>^>^>^>^>^>^>^>^>^>^>^>^>^>^>^>^>^>^>^>^>^ ^>^>^>^>^>^>^>^>^>^>^>^>^>^>^>^>^>^>^>^>^>^>^>^>^>^>^>^>^>^>^>^>^>^>^>^>^

## 2023-07-05 ENCOUNTER — Other Ambulatory Visit: Payer: Self-pay | Admitting: Internal Medicine

## 2023-07-05 MED ORDER — PHENTERMINE HCL 37.5 MG PO TABS: ORAL_TABLET | ORAL | 1 refills | Status: DC

## 2023-09-04 ENCOUNTER — Ambulatory Visit: Payer: 59 | Admitting: Nurse Practitioner

## 2023-10-01 ENCOUNTER — Other Ambulatory Visit: Payer: Self-pay | Admitting: Internal Medicine

## 2023-10-01 DIAGNOSIS — E782 Mixed hyperlipidemia: Secondary | ICD-10-CM

## 2023-10-01 DIAGNOSIS — E66812 Obesity, class 2: Secondary | ICD-10-CM

## 2023-11-01 ENCOUNTER — Other Ambulatory Visit: Payer: Self-pay | Admitting: Internal Medicine

## 2023-11-01 DIAGNOSIS — N521 Erectile dysfunction due to diseases classified elsewhere: Secondary | ICD-10-CM

## 2023-11-30 ENCOUNTER — Other Ambulatory Visit: Payer: Self-pay | Admitting: Internal Medicine

## 2023-12-09 ENCOUNTER — Encounter: Payer: Self-pay | Admitting: Nurse Practitioner

## 2023-12-09 ENCOUNTER — Ambulatory Visit (INDEPENDENT_AMBULATORY_CARE_PROVIDER_SITE_OTHER): Payer: 59 | Admitting: Nurse Practitioner

## 2023-12-09 ENCOUNTER — Other Ambulatory Visit: Payer: Self-pay

## 2023-12-09 VITALS — BP 124/84 | HR 94 | Temp 97.6°F | Ht 69.0 in | Wt 221.6 lb

## 2023-12-09 DIAGNOSIS — R051 Acute cough: Secondary | ICD-10-CM | POA: Diagnosis not present

## 2023-12-09 DIAGNOSIS — Z1152 Encounter for screening for COVID-19: Secondary | ICD-10-CM | POA: Diagnosis not present

## 2023-12-09 DIAGNOSIS — J0100 Acute maxillary sinusitis, unspecified: Secondary | ICD-10-CM | POA: Diagnosis not present

## 2023-12-09 LAB — POC COVID19 BINAXNOW: SARS Coronavirus 2 Ag: NEGATIVE

## 2023-12-09 MED ORDER — PROMETHAZINE-DM 6.25-15 MG/5ML PO SYRP: 5.0000 mL | ORAL_SOLUTION | Freq: Four times a day (QID) | ORAL | 0 refills | Status: DC | PRN

## 2023-12-09 MED ORDER — AZITHROMYCIN 250 MG PO TABS
ORAL_TABLET | ORAL | 1 refills | Status: DC
Start: 2023-12-09 — End: 2023-12-19

## 2023-12-09 MED ORDER — PREDNISONE 10 MG PO TABS
ORAL_TABLET | ORAL | 0 refills | Status: DC
Start: 2023-12-09 — End: 2023-12-19

## 2023-12-09 NOTE — Progress Notes (Signed)
Assessment and Plan:  Mike Cohen was seen today for an episodic visit.  Diagnoses and all order for this visit:  1. Encounter for screening for COVID-19 (Primary) Negative  - POC COVID-19  2. Acute non-recurrent maxillary sinusitis Start tmt with Z-Pak. Start steroid taper as directed Stay well hdyrated to keep mucus thin and productive.  - azithromycin (ZITHROMAX) 250 MG tablet; Take 2 tablets on  Day 1,  followed by 1 tablet  daily for 4 more days    for Sinusitis  /Bronchitis  Dispense: 6 each; Refill: 1 - predniSONE (DELTASONE) 10 MG tablet; 1 tab 3 x day for 2 days, then 1 tab 2 x day for 2 days, then 1 tab 1 x day for 3 days  Dispense: 13 tablet; Refill: 0  3. Acute cough Start Promethazine cough syrup  Stay well hydrated to keep any mucus thin an d productive. Coughing can be cuased by several factors including   breathing in things that bother (irritate) your lungs.  Allergies.  Asthma.  Mucus that runs down the back of your throat (postnasal drip).  Smoking/smoke.  Acid backing up from the stomach into the tube that moves food from the mouth to the stomach (gastroesophageal reflux). A cough can linger for 3 weeks. Watch for any changes in your cough and contact office if noticed including blood, pus, pain, night sweats. Cover your mouth when you cough. If the air is dry, use a cool mist vaporizer or humidifier in your home. If your cough is worse at night, try using extra pillows to raise your head up higher while you sleep. Call 911 or report to ER if you start to have difficulty breathing.   - promethazine-dextromethorphan (PROMETHAZINE-DM) 6.25-15 MG/5ML syrup; Take 5 mLs by mouth 4 (four) times daily as needed for cough.  Dispense: 240 mL; Refill: 0  Notify office for further evaluation and treatment, questions or concerns if s/s fail to improve. The risks and benefits of my recommendations, as well as other treatment options were discussed with the patient  today. Questions were answered.  Further disposition pending results of labs. Discussed med's effects and SE's.    Over 20 minutes of exam, counseling, chart review, and critical decision making was performed.   Future Appointments  Date Time Provider Department Center  12/19/2023  9:30 AM Lucky Cowboy, MD [facility] None  06/09/2024  3:00 PM Lucky Cowboy, MD [facility] None    ------------------------------------------------------------------------------------------------------------------   HPI BP 124/84   Pulse 94   Temp 97.6 F (36.4 C)   Ht 5\' 9"  (1.753 m)   Wt 221 lb 9.6 oz (100.5 kg)   SpO2 98%   BMI 32.72 kg/m    Patient complains of symptoms of a URI, possible sinusitis. Symptoms include congestion, cough described as nonproductive, nasal congestion, and sinus pressure. Onset of symptoms was 5 days ago, and has been unchanged since that time. Treatment to date: antihistamines and nasal steroids.   Past Medical History:  Diagnosis Date   Diabetes mellitus without complication (HCC)    Hyperlipidemia    Hypertension    Other testicular hypofunction    Prediabetes    Sleep apnea      No Known Allergies  Current Outpatient Medications on File Prior to Visit  Medication Sig   aspirin EC 81 MG tablet Take 81 mg by mouth daily.   Cholecalciferol (VITAMIN D PO) Take 2,000 Int'l Units by mouth daily.   ipratropium (ATROVENT) 0.06 % nasal spray Use 1 to  2 sprays each nostril 2 to 3 x /day as needed for watery nasal drainage   losartan-hydrochlorothiazide (HYZAAR) 50-12.5 MG tablet Take 1 tablet by mouth once daily for blood pressure   Multiple Vitamin (MULTIVITAMIN) tablet Take 1 tablet by mouth daily.   phentermine (ADIPEX-P) 37.5 MG tablet Take 1/2 to 1 tablet every Morning for Dieting & Weight Loss                                                                                    /                               TAKE                                 BY                                               MOUTH                                           ONCE ? ? ? DAILY   rosuvastatin (CRESTOR) 40 MG tablet Take  1 tablet   Daily for  Cholesterol                               /                                                                   TAKE                                         BY                                                 MOUTH   SYRINGE-NEEDLE, DISP, 5 ML (SAFETY SYRINGES/NEEDLE) 22G X 1-1/2" 5 ML MISC Use tor testosterone injections   tadalafil (CIALIS) 20 MG tablet Take  1/2 to 1 tablet  every 2 to 3 days  as needed [place]                                        /  TAKE                                         BY                                                 MOUTH   testosterone cypionate (DEPOTESTOSTERONE CYPIONATE) 200 MG/ML injection Inject  1 ml   into Muscle  every 7 days   tirzepatide (MOUNJARO) 15 MG/0.5ML Pen Inject   1 pen (15 mg)   into Skin   every 7 days   for Diabetes (Dx: e11.29)   topiramate (TOPAMAX) 50 MG tablet Take  1 tablet  2 x /day at Supper & Bedtime for Dieting & Weight  Loss                                /                                                                   TAKE                                         BY                                                 MOUTH   cephALEXin (KEFLEX) 500 MG capsule Take  1 capsule   3 x / day   after Meals for Skin Infection   No current facility-administered medications on file prior to visit.    ROS: all negative except what is noted in the HPI.   Physical Exam:  BP 124/84   Pulse 94   Temp 97.6 F (36.4 C)   Ht 5\' 9"  (1.753 m)   Wt 221 lb 9.6 oz (100.5 kg)   SpO2 98%   BMI 32.72 kg/m   General Appearance: NAD.  Awake, conversant and cooperative. Eyes: PERRLA, EOMs intact.  Sclera white.  Conjunctiva without erythema. Sinuses: Frontal/maxillary tenderness.  No nasal discharge. Nares patent.  ENT/Mouth: Ext  aud canals clear.  Bilateral TMs w/DOL and without erythema or bulging. Hearing intact.  Posterior pharynx without swelling or exudate.  Tonsils without swelling or erythema.  Neck: Supple.  No masses, nodules or thyromegaly. Respiratory: Effort is regular with non-labored breathing. Breath sounds are equal bilaterally without rales, rhonchi, wheezing or stridor.  Cardio: RRR with no MRGs. Brisk peripheral pulses without edema.  Abdomen: Active BS in all four quadrants.  Soft and non-tender without guarding, rebound tenderness, hernias or masses. Lymphatics: Non tender without lymphadenopathy.  Musculoskeletal: Full ROM, 5/5 strength, normal ambulation.  No clubbing or cyanosis. Skin: Appropriate color for ethnicity. Warm without rashes, lesions, ecchymosis, ulcers.  Neuro: CN II-XII grossly normal. Normal  muscle tone without cerebellar symptoms and intact sensation.   Psych: AO X 3,  appropriate mood and affect, insight and judgment.     Adela Glimpse, NP 4:20 PM [facility]

## 2023-12-09 NOTE — Patient Instructions (Signed)

## 2023-12-12 ENCOUNTER — Other Ambulatory Visit: Payer: Self-pay | Admitting: Internal Medicine

## 2023-12-12 ENCOUNTER — Other Ambulatory Visit: Payer: Self-pay | Admitting: Nurse Practitioner

## 2023-12-12 MED ORDER — BENZONATATE 200 MG PO CAPS: ORAL_CAPSULE | ORAL | 1 refills | Status: DC

## 2023-12-18 NOTE — Progress Notes (Signed)
 [facility]      ADULT   &   AD[facility]      INTERNAL MEDICINE  Elsie Richards, M.D.          Lonell Rous, ANP        Tonya Cranford, FNP  [facility] [address]  [place], [place]. [address] Telephone [phone] Telefax [phone]   Future Appointments  Date Time Provider Department  12/19/2023                  6 mo   9:30 AM Richards Elsie, MD [facility]  06/09/2024                  cpe  3:00 PM Richards Elsie, MD [facility]    History of Present Illness:       This very nice 53 y.o. MBM presents for 6 month follow up with HTN, HLD, T2_NIDDM and Vitamin D  Deficiency.   Patient has hx/o OSA & is off CPAP since weight loss.        Patient is treated for HTN  (2007)   & BP has been controlled at home. Today's BP is at goal - 124/80 . Patient has had no complaints of any cardiac type chest pain, palpitations, dyspnea chet /PND, dizziness, claudication or dependent edema.       Hyperlipidemia is controlled with diet & Rosuvastatin .   Patient denies myalgias or other med SE's. Last Lipids were at goal :  Lab Results  Component Value Date   CHOL 124 06/03/2023   HDL 34 (L) 06/03/2023   LDLCALC 69 06/03/2023   TRIG 129 06/03/2023   CHOLHDL 3.6 06/03/2023     Also, the patient has history of T2_NIDDM  (A1c 6.5% /2012  and  A1c 6.7%  May  2022).  In Oct 2022, he was started on Mounjaro  and has successfully lost 45#  weight from 266#  ( BMI 37.4 )  down to current weight  221 #  ( BMI  32.72 ) . and he has had no symptoms of reactive hypoglycemia, diabetic polys, paresthesias or visual blurring.  Last A1c was near goal :  Lab Results  Component Value Date   HGBA1C 6.0 (H) 06/03/2023   Wt Readings from Last 3 Encounters:  12/19/23 225 lb (102.1 kg)  12/09/23 221 lb 9.6 oz (100.5 kg)  06/03/23 220 lb (99.8 kg)                                                      Further, the patient also has history of Vitamin D  Deficiency  (9  /2009) and supplements vitamin D  . Last vitamin D  was at goal :  Lab Results  Component Value Date   VD25OH 82 06/03/2023            Patient is on Depo-Testosterone   injection since 2010 for hx/o low T (252/2010 & 117/2012) with improved stamina.    Current Outpatient Medications on File Prior to Visit  Medication Sig   aspirin EC 81 MG tablet Take  daily.   VITAMIN D   2,000 Units  Take daily.   ipratropium (ATROVENT ) 0.06 % nasal spray Use 1 to 2 sprays each nostril 2 to 3 x /day as needed for watery nasal drainage   losartan -hctz  50-12.5 MG tablet TAKE  ONE TABLET  DAILY    minocycline  50 MG capsule Take  1 capsule  2 x /day   for Skin Infection  ( off )    Multiple Vitamin  Take 1 tablet daily.   phentermine  37.5 MG tablet Take 1/2 to 1 tablet   every Morning    rosuvastatin   40 MG tablet Take  1 tablet  Daily    tadalafil   20 MG tablet Take  1/2 to 1 tablet  every  2 to 3 days  as needed    testosterone  cypio 200 MG/ML injection Inject  1 ml  into Muscle  every week.   tirzepatide  (MOUNJARO ) 5 MG/0.5ML Pen Inject 5 mg into the skin once a week.   topiramate   50 MG tablet Take  1 tablet  2 x /day  at Supper & Bedtime for Dieting     No Known Allergies   PMHx:   Past Medical History:  Diagnosis Date   Diabetes mellitus without complication (HCC)    Hyperlipidemia    Hypertension    Other testicular hypofunction    Prediabetes    Sleep apnea      Immunization History  Administered Date(s) Administered   Influenza Inj Mdck Quad  11/04/2017   Influenza Split 09/22/2014, 09/19/2015, 09/12/2016   Influenza 09/09/2018, 10/21/2019   PFIZER-SARS-COV-2 Vacc 11/25/2020   PPD Test 12/17/2018, 01/05/2020, 01/19/2021, 01/30/2022   Td 10/13/2002   Tdap 09/19/2015     Past Surgical History:  Procedure Laterality Date   ROTATOR CUFF REPAIR Right 2007   SHOULDER ARTHROSCOPY Right 2000    FHx:    Reviewed / unchanged  SHx:    Reviewed / unchanged   Systems  Review:  Constitutional: Denies fever, chills, wt changes, headaches, insomnia, fatigue, night sweats, change in appetite. Eyes: Denies redness, blurred vision, diplopia, discharge, itchy, watery eyes.  ENT: Denies discharge, congestion, post nasal drip, epistaxis, sore throat, earache, hearing loss, dental pain, tinnitus, vertigo, sinus pain, snoring.  CV: Denies chest pain, palpitations, irregular heartbeat, syncope, dyspnea, diaphoresis, orthopnea, PND, claudication or edema. Respiratory: denies cough, dyspnea, DOE, pleurisy, hoarseness, laryngitis, wheezing.  Gastrointestinal: Denies dysphagia, odynophagia, heartburn, reflux, water brash, abdominal pain or cramps, nausea, vomiting, bloating, diarrhea, constipation, hematemesis, melena, hematochezia  or hemorrhoids. Genitourinary: Denies dysuria, frequency, urgency, nocturia, hesitancy, discharge, hematuria or flank pain. Musculoskeletal: Denies arthralgias, myalgias, stiffness, jt. swelling, pain, limping or strain/sprain.  Skin: Denies pruritus, rash, hives, warts, acne, eczema or change in skin lesion(s). Neuro: No weakness, tremor, incoordination, spasms, paresthesia or pain. Psychiatric: Denies confusion, memory loss or sensory loss. Endo: Denies change in weight, skin or hair change.  Heme/Lymph: No excessive bleeding, bruising or enlarged lymph nodes.  Physical Exam  BP 124/80   Pulse 97   Temp 97.9 F (36.6 C)   Resp 16   Ht 5' 9 (1.753 m)   Wt 225 lb (102.1 kg)   SpO2 97%   BMI 33.23 kg/m   Appears  over nourished, well groomed  and in no distress.  Eyes: PERRLA, EOMs, conjunctiva no swelling or erythema. Sinuses: No frontal/maxillary tenderness ENT/Mouth: EAC's clear, TM's nl w/o erythema, bulging. Nares clear w/o erythema, swelling, exudates. Oropharynx clear without erythema or exudates. Oral hygiene is good. Tongue normal, non obstructing. Hearing intact.  Neck: Supple. Thyroid  not palpable. Car 2+/2+ without  bruits, nodes or JVD. Chest: Respirations nl with BS clear & equal w/o rales, rhonchi, wheezing or stridor.  Cor: Heart sounds normal w/ regular rate and rhythm  without sig. murmurs, gallops, clicks or rubs. Peripheral pulses normal and equal  without edema.  Abdomen: Soft & bowel sounds normal. Non-tender w/o guarding, rebound, hernias, masses or organomegaly.  Lymphatics: Unremarkable.  Musculoskeletal: Full ROM all peripheral extremities, joint stability, 5/5 strength and normal gait.  Skin: Warm, dry without exposed rash, lesions or ecchymosis apparent. (+) nuchal sub-occipital cystic skin lesions . Neuro: Cranial nerves intact, reflexes equal bilaterally. Sensory-motor testing grossly intact. Tendon reflexes grossly intact.  Pysch: Alert & oriented x 3.  Insight and judgement nl & appropriate. No ideations.  Assessment and Plan:  1. Essential hypertension  - Continue medication, monitor blood pressure at home.  - Continue DASH diet.  Reminder to go to the ER if any CP,  SOB, nausea, dizziness, severe HA, changes vision/speech.   - CBC with Differential/Platelet - COMPLETE METABOLIC PANEL WITH GFR - Magnesium - TSH  2. Hyperlipidemia associated with type 2 diabetes mellitus (HCC)  - Continue diet/meds, exercise,& lifestyle modifications.  - Continue monitor periodic cholesterol/liver & renal functions    - Lipid panel - TSH  3. Type 2 diabetes mellitus with stage 2 chronic kidney                                              disease, without long-term current use of insulin  (HCC)  - Continue diet, exercise  - Lifestyle modifications.  - Monitor appropriate labs    - Hemoglobin A1c - Insulin , random  4. Vitamin D  deficiency  - Continue supplementation   - VITAMIN D  25 Hydroxy   5. Testosterone  deficiency  - Testosterone   6. Medication management  - CBC with Differential/Platelet - COMPLETE METABOLIC PANEL WITH GFR - Magnesium - Lipid panel - TSH -  Hemoglobin A1c - Insulin , random - VITAMIN D  25 Hydroxy - Testosterone          Discussed  regular exercise, BP monitoring, weight control to achieve/maintain BMI less than 25 and discussed med and SE's. Recommended labs to assess /monitor clinical status .  I discussed the assessment and treatment plan with the patient. The patient was provided an opportunity to ask questions and all were answered. The patient agreed with the plan and demonstrated an understanding of the instructions.  I provided over 30 minutes of exam, counseling, chart review and  complex critical decision making.        The patient was advised to call back or seek an in-person evaluation if the symptoms worsen or if the condition fails to improve as anticipated.   Elsie JONETTA Richards, MD

## 2023-12-19 ENCOUNTER — Encounter: Payer: Self-pay | Admitting: Internal Medicine

## 2023-12-19 ENCOUNTER — Ambulatory Visit (INDEPENDENT_AMBULATORY_CARE_PROVIDER_SITE_OTHER): Payer: 59 | Admitting: Internal Medicine

## 2023-12-19 VITALS — BP 124/80 | HR 97 | Temp 97.9°F | Resp 16 | Ht 69.0 in | Wt 225.0 lb

## 2023-12-19 DIAGNOSIS — E559 Vitamin D deficiency, unspecified: Secondary | ICD-10-CM

## 2023-12-19 DIAGNOSIS — L732 Hidradenitis suppurativa: Secondary | ICD-10-CM

## 2023-12-19 DIAGNOSIS — E785 Hyperlipidemia, unspecified: Secondary | ICD-10-CM

## 2023-12-19 DIAGNOSIS — E1122 Type 2 diabetes mellitus with diabetic chronic kidney disease: Secondary | ICD-10-CM | POA: Diagnosis not present

## 2023-12-19 DIAGNOSIS — E1169 Type 2 diabetes mellitus with other specified complication: Secondary | ICD-10-CM

## 2023-12-19 DIAGNOSIS — Z79899 Other long term (current) drug therapy: Secondary | ICD-10-CM

## 2023-12-19 DIAGNOSIS — I10 Essential (primary) hypertension: Secondary | ICD-10-CM | POA: Diagnosis not present

## 2023-12-19 DIAGNOSIS — E349 Endocrine disorder, unspecified: Secondary | ICD-10-CM | POA: Diagnosis not present

## 2023-12-19 DIAGNOSIS — N182 Chronic kidney disease, stage 2 (mild): Secondary | ICD-10-CM

## 2023-12-19 MED ORDER — DOXYCYCLINE HYCLATE 100 MG PO CAPS: ORAL_CAPSULE | ORAL | 1 refills | Status: DC

## 2023-12-19 NOTE — Patient Instructions (Signed)

## 2023-12-20 LAB — CBC WITH DIFFERENTIAL/PLATELET
Absolute Lymphocytes: 1837 {cells}/uL (ref 850–3900)
Absolute Monocytes: 396 {cells}/uL (ref 200–950)
Basophils Absolute: 61 {cells}/uL (ref 0–200)
Basophils Relative: 1.1 %
Eosinophils Absolute: 132 {cells}/uL (ref 15–500)
Eosinophils Relative: 2.4 %
HCT: 51.8 % — ABNORMAL HIGH (ref 38.5–50.0)
Hemoglobin: 17.2 g/dL — ABNORMAL HIGH (ref 13.2–17.1)
MCH: 27.5 pg (ref 27.0–33.0)
MCHC: 33.2 g/dL (ref 32.0–36.0)
MCV: 82.7 fL (ref 80.0–100.0)
MPV: 9.2 fL (ref 7.5–12.5)
Monocytes Relative: 7.2 %
Neutro Abs: 3075 {cells}/uL (ref 1500–7800)
Neutrophils Relative %: 55.9 %
Platelets: 290 10*3/uL (ref 140–400)
RBC: 6.26 10*6/uL — ABNORMAL HIGH (ref 4.20–5.80)
RDW: 13.6 % (ref 11.0–15.0)
Total Lymphocyte: 33.4 %
WBC: 5.5 10*3/uL (ref 3.8–10.8)

## 2023-12-20 LAB — HEMOGLOBIN A1C
Hgb A1c MFr Bld: 5.5 %{Hb} (ref <5.7)
Mean Plasma Glucose: 111 mg/dL
eAG (mmol/L): 6.2 mmol/L

## 2023-12-20 LAB — COMPLETE METABOLIC PANEL WITH GFR
AG Ratio: 1.9 (calc) (ref 1.0–2.5)
ALT: 34 U/L (ref 9–46)
AST: 31 U/L (ref 10–35)
Albumin: 4.3 g/dL (ref 3.6–5.1)
Alkaline phosphatase (APISO): 38 U/L (ref 35–144)
BUN: 10 mg/dL (ref 7–25)
CO2: 26 mmol/L (ref 20–32)
Calcium: 9.2 mg/dL (ref 8.6–10.3)
Chloride: 104 mmol/L (ref 98–110)
Creat: 1.24 mg/dL (ref 0.70–1.30)
Globulin: 2.3 g/dL (ref 1.9–3.7)
Glucose, Bld: 73 mg/dL (ref 65–99)
Potassium: 4 mmol/L (ref 3.5–5.3)
Sodium: 138 mmol/L (ref 135–146)
Total Bilirubin: 0.6 mg/dL (ref 0.2–1.2)
Total Protein: 6.6 g/dL (ref 6.1–8.1)
eGFR: 70 mL/min/{1.73_m2} (ref >=60)

## 2023-12-20 LAB — LIPID PANEL
Cholesterol: 125 mg/dL (ref <200)
HDL: 36 mg/dL — ABNORMAL LOW (ref >=40)
LDL Cholesterol (Calc): 73 mg/dL
Non-HDL Cholesterol (Calc): 89 mg/dL (ref <130)
Total CHOL/HDL Ratio: 3.5 (calc) (ref <5.0)
Triglycerides: 81 mg/dL (ref <150)

## 2023-12-20 LAB — INSULIN, RANDOM: Insulin: 7.6 u[IU]/mL

## 2023-12-20 LAB — TSH: TSH: 0.7 m[IU]/L (ref 0.40–4.50)

## 2023-12-20 LAB — VITAMIN D 25 HYDROXY (VIT D DEFICIENCY, FRACTURES): Vit D, 25-Hydroxy: 86 ng/mL (ref 30–100)

## 2023-12-20 LAB — MAGNESIUM: Magnesium: 2.1 mg/dL (ref 1.5–2.5)

## 2023-12-20 LAB — TESTOSTERONE: Testosterone: 1745 ng/dL — ABNORMAL HIGH (ref 250–827)

## 2023-12-22 NOTE — Progress Notes (Signed)
-   Test results slightly outside the reference range are not unusual. If there is anything important, I will review this with you,  otherwise it is considered normal test values.  If you have further questions,  please do not hesitate to contact me at the office or via My Chart.   =========================================================================  -  Chol = 125    &   LDL =  73   -   Both  Excellent   - Very low risk for Heart Attack  / Stroke  =========================================================================  -    Testosterone  level = 1745  is very high, So recommend decrease dose to 1/2 cc every 7 days                       ( Ideal is 450 to 850)   =========================================================================  -  A1c = 5.5% - Great back in Normal non-Diabetic range  =========================================================================  -  Vitamin D  =- 86  -   Also Excellent, Please keep dosage same   - Vitamin D  goal is between 70-100.   - It is very important as a natural anti-inflammatory and helping the                          immune system protect against viral infections, like Flu  & the Covid    - Also helps hair, skin, and nails, as well as reducing stroke and heart attack risk.   - It helps your bones  &  and helps with mood.  - It also decreases numerous cancer risks  - Low Vit D is associated with a 200-300% higher risk for CANCER                                    and 200-300% higher risk for HEART   ATTACK  &  STROKE.    - It is also associated with higher death rate at younger ages,                   autoimmune diseases like Rheumatoid arthritis, Lupus, Multiple Sclerosis.     - Also many other serious conditions, like depression, Alzheimer's Dementia                                                                               muscle aches, fatigue, fibromyalgia    =========================================================================  -  All Else - CBC - Kidneys - Electrolytes - Liver - Magnesium & Thyroid     - all  Normal / OK  ===========================================================

## 2024-01-14 ENCOUNTER — Ambulatory Visit: Payer: 59 | Admitting: Nurse Practitioner

## 2024-02-03 ENCOUNTER — Other Ambulatory Visit: Payer: Self-pay | Admitting: Family

## 2024-02-03 ENCOUNTER — Other Ambulatory Visit: Payer: Self-pay

## 2024-02-03 MED ORDER — TESTOSTERONE CYPIONATE 200 MG/ML IM SOLN: INTRAMUSCULAR | 1 refills | Status: DC

## 2024-02-26 ENCOUNTER — Other Ambulatory Visit: Payer: Self-pay | Admitting: Family

## 2024-02-26 ENCOUNTER — Other Ambulatory Visit: Payer: Self-pay

## 2024-02-26 DIAGNOSIS — E1122 Type 2 diabetes mellitus with diabetic chronic kidney disease: Secondary | ICD-10-CM

## 2024-02-26 DIAGNOSIS — N521 Erectile dysfunction due to diseases classified elsewhere: Secondary | ICD-10-CM

## 2024-02-26 DIAGNOSIS — E66812 Obesity, class 2: Secondary | ICD-10-CM

## 2024-02-26 DIAGNOSIS — L732 Hidradenitis suppurativa: Secondary | ICD-10-CM

## 2024-02-26 DIAGNOSIS — N182 Chronic kidney disease, stage 2 (mild): Secondary | ICD-10-CM

## 2024-02-26 DIAGNOSIS — E782 Mixed hyperlipidemia: Secondary | ICD-10-CM

## 2024-02-26 MED ORDER — TADALAFIL 20 MG PO TABS: ORAL_TABLET | ORAL | 3 refills | Status: DC

## 2024-02-26 MED ORDER — TIRZEPATIDE 15 MG/0.5ML ~~LOC~~ SOAJ: SUBCUTANEOUS | 3 refills | Status: DC

## 2024-02-26 MED ORDER — DOXYCYCLINE HYCLATE 100 MG PO CAPS: ORAL_CAPSULE | ORAL | 1 refills | Status: DC

## 2024-02-26 MED ORDER — LOSARTAN POTASSIUM-HCTZ 50-12.5 MG PO TABS: ORAL_TABLET | ORAL | 0 refills | Status: DC

## 2024-02-26 MED ORDER — PHENTERMINE HCL 37.5 MG PO TABS: ORAL_TABLET | ORAL | 0 refills | Status: AC

## 2024-02-26 MED ORDER — TOPIRAMATE 50 MG PO TABS: ORAL_TABLET | ORAL | 3 refills | Status: AC

## 2024-02-26 MED ORDER — ROSUVASTATIN CALCIUM 40 MG PO TABS: ORAL_TABLET | ORAL | 3 refills | Status: DC

## 2024-04-01 ENCOUNTER — Ambulatory Visit: Payer: 59 | Admitting: Nurse Practitioner

## 2024-06-09 ENCOUNTER — Encounter: Payer: 59 | Admitting: Internal Medicine

## 2024-07-16 ENCOUNTER — Encounter: Payer: 59 | Admitting: Internal Medicine

## 2024-08-26 ENCOUNTER — Ambulatory Visit: Admitting: Family Medicine

## 2024-09-03 ENCOUNTER — Ambulatory Visit: Admitting: Family Medicine

## 2024-09-07 NOTE — Telephone Encounter (Signed)
 error

## 2024-09-18 ENCOUNTER — Ambulatory Visit
Admission: EM | Admit: 2024-09-18 | Discharge: 2024-09-18 | Disposition: A | Discharge status: Home / Self Care | Attending: Family Medicine | Admitting: Family Medicine

## 2024-09-18 ENCOUNTER — Ambulatory Visit

## 2024-09-18 DIAGNOSIS — R0789 Other chest pain: Secondary | ICD-10-CM | POA: Diagnosis not present

## 2024-09-18 DIAGNOSIS — R1011 Right upper quadrant pain: Secondary | ICD-10-CM

## 2024-09-18 DIAGNOSIS — R0602 Shortness of breath: Secondary | ICD-10-CM | POA: Diagnosis not present

## 2024-09-18 MED ORDER — HYDROCODONE-ACETAMINOPHEN 5-325 MG PO TABS: 1.0000 | ORAL_TABLET | Freq: Four times a day (QID) | ORAL | 0 refills | Status: DC | PRN

## 2024-09-18 NOTE — Discharge Instructions (Signed)
 May take Ibuprofen 200mg , 4 tabs every 8 hours with food as needed.  Rest.  Increase fluid intake.

## 2024-09-18 NOTE — ED Triage Notes (Signed)
 Pt states that he has right sided rib pain. X2 weeks  Pt denies any known injury. Pt states that the pain makes it hard for him to breathe.

## 2024-09-18 NOTE — ED Provider Notes (Signed)
 [name]    CSN: [id number] Arrival date & time: 09/18/24  1951      History   Chief Complaint Chief Complaint  Patient presents with   Chest Pain    HPI Klayten Jolliff is a 53 y.o. male.   Two weeks ago patient developed occasional sharp pain in his right anterior/inferior chest.  He denies cough, shortness of breath, fever, recent injury or changes in activities.  He also denies nausea/vomiting or changes in appetite.  His bowel movements have been normal.  After cutting grass today, his pain became constant, worse with movement and inspiration.  The history is provided by the patient.  Chest Pain Chest pain location: Right anterior/inferior chest. Pain quality: sharp   Pain quality: not radiating   Pain radiates to:  Does not radiate Pain severity:  Moderate Onset quality:  Gradual Duration:  2 weeks Timing:  Constant Progression:  Worsening Chronicity:  New Context: breathing, lifting, movement and at rest   Context: not eating and not trauma   Relieved by:  Certain positions Worsened by:  Coughing, deep breathing and movement Associated symptoms: no abdominal pain, no AICD problem, no anorexia, no cough, no diaphoresis, no dizziness, no dysphagia, no fatigue, no fever, no nausea, no shortness of breath and no vomiting     Past Medical History:  Diagnosis Date   Diabetes mellitus without complication (HCC)    Hyperlipidemia    Hypertension    Other testicular hypofunction    Prediabetes    Sleep apnea     Patient Active Problem List   Diagnosis Date Noted   Viral upper respiratory tract infection 09/15/2020   Intercostal pain 09/15/2020   Hepatic steatosis 03/27/2019   Obesity (BMI 30.0-34.9) 09/19/2015   Medication management 04/02/2014   Vitamin D  deficiency 04/02/2014   OSA on CPAP 04/02/2014   IBS 04/02/2014   Hypertension    Prediabetes    Testosterone  deficiency    Hyperlipidemia, mixed     Past Surgical History:  Procedure  Laterality Date   ROTATOR CUFF REPAIR Right 2007   SHOULDER ARTHROSCOPY Right 2000       Home Medications    Prior to Admission medications   Medication Sig Start Date End Date Taking? Authorizing Provider  aspirin EC 81 MG tablet Take 81 mg by mouth daily.   Yes [provider]  Cholecalciferol (VITAMIN D  PO) Take 2,000 Int'l Units by mouth daily.   Yes [provider]  HYDROcodone-acetaminophen (NORCO/VICODIN) 5-325 MG tablet Take 1 tablet by mouth every 6 (six) hours as needed for moderate pain (pain score 4-6) or severe pain (pain score 7-10). 09/18/24  Yes Pauline Garnette LABOR, MD  ipratropium (ATROVENT ) 0.06 % nasal spray Use 1 to 2 sprays each nostril 2 to 3 x /day as needed for watery nasal drainage 05/11/22  Yes Jeanine Knee, NP  losartan -hydrochlorothiazide (HYZAAR) 50-12.5 MG tablet Take 1 tablet by mouth once daily for blood pressure 02/26/24  Yes Webb, Padonda B, FNP  Multiple Vitamin (MULTIVITAMIN) tablet Take 1 tablet by mouth daily.   Yes [provider]  phentermine  (ADIPEX-P ) 37.5 MG tablet Take 1/2 to 1 tablet every Morning for Dieting & Weight Loss                                                                                    /  TAKE                                 BY                                              MOUTH                                           ONCE ? ? ? DAILY 02/26/24  Yes Webb, Padonda B, FNP  rosuvastatin  (CRESTOR ) 40 MG tablet Take  1 tablet   Daily for  Cholesterol                               /                                                                   TAKE                                         BY                                                 MOUTH 02/26/24  Yes Webb, Padonda B, FNP  SYRINGE-NEEDLE, DISP, 5 ML (SAFETY SYRINGES/NEEDLE) 22G X 1-1/2 5 ML MISC Use tor testosterone  injections 06/03/23  Yes Tonita Fallow, MD  tadalafil  (CIALIS ) 20 MG tablet Take  1/2 to 1 tablet  every 2 to 3  days  as needed XXXX                                        /                                                                   TAKE                                         BY                                                 MOUTH 02/26/24  Yes Webb, Padonda B, FNP  testosterone  cypionate (DEPOTESTOSTERONE CYPIONATE)  200 MG/ML injection Inject  1 ml   into Muscle  every 7 days 02/03/24  Yes Webb, Padonda B, FNP  tirzepatide  (MOUNJARO ) 15 MG/0.5ML Pen Inject   1 pen (15 mg)   into Skin   every 7 days   for Diabetes (Dx: e11.29) 02/26/24  Yes Webb, Padonda B, FNP  topiramate  (TOPAMAX ) 50 MG tablet Take  1 tablet  2 x /day at Supper & Bedtime for Dieting & Weight  Loss                                /                                                                   TAKE                                         BY                                                 MOUTH 02/26/24  Yes Douglass Kenney NOVAK, FNP    Family History Family History  Problem Relation Age of Onset   Heart disease Father    Hyperlipidemia Father    Hypertension Father    Colon cancer Neg Hx    Esophageal cancer Neg Hx    Pancreatic cancer Neg Hx     Social History Social History   Tobacco Use   Smoking status: Former    Types: Cigarettes   Smokeless tobacco: Former   Tobacco comments:    occasional cigar  Vaping Use   Vaping status: Former  Substance Use Topics   Alcohol use: Yes    Alcohol/week: 0.0 standard drinks of alcohol    Comment: occasional   Drug use: No     Allergies   Amoxicillin   Review of Systems Review of Systems  Constitutional:  Negative for activity change, appetite change, chills, diaphoresis, fatigue and fever.  HENT:  Negative for trouble swallowing.   Respiratory:  Positive for chest tightness. Negative for cough and shortness of breath.   Cardiovascular:  Positive for chest pain.  Gastrointestinal:  Negative for abdominal distention, abdominal pain, anorexia, constipation, diarrhea, nausea  and vomiting.  Skin:  Negative for rash.  Neurological:  Negative for dizziness.  All other systems reviewed and are negative.    Physical Exam Triage Vital Signs ED Triage Vitals  Encounter Vitals Group     BP 09/18/24 2003 (!) 154/105     Girls Systolic BP Percentile --      Girls Diastolic BP Percentile --      Boys Systolic BP Percentile --      Boys Diastolic BP Percentile --      Pulse Rate 09/18/24 2003 88     Resp 09/18/24 2003 17     Temp 09/18/24 2003 99.9 F (37.7 C)     Temp Source 09/18/24 2003 Oral     SpO2  09/18/24 2003 96 %     Weight 09/18/24 2000 210 lb (95.3 kg)     Height 09/18/24 2000 5' 9 (1.753 m)     Head Circumference --      Peak Flow --      Pain Score 09/18/24 2000 8     Pain Loc --      Pain Education --      Exclude from Growth Chart --    No data found.  Updated Vital Signs BP (!) 154/105 (BP Location: Right Arm)   Pulse 88   Temp 99.9 F (37.7 C) (Oral)   Resp 17   Ht 5' 9 (1.753 m)   Wt 95.3 kg   SpO2 96%   BMI 31.01 kg/m   Visual Acuity Right Eye Distance:   Left Eye Distance:   Bilateral Distance:    Right Eye Near:   Left Eye Near:    Bilateral Near:     Physical Exam Vitals and nursing note reviewed.  Constitutional:      General: He is not in acute distress.    Appearance: He is well-developed. He is not ill-appearing.  HENT:     Head: Normocephalic.     Mouth/Throat:     Mouth: Mucous membranes are moist.     Pharynx: Oropharynx is clear.  Eyes:     Extraocular Movements: Extraocular movements intact.     Conjunctiva/sclera: Conjunctivae normal.     Pupils: Pupils are equal, round, and reactive to light.  Cardiovascular:     Rate and Rhythm: Normal rate and regular rhythm.     Heart sounds: Normal heart sounds.  Pulmonary:     Breath sounds: Normal breath sounds.  Chest:     Chest wall: Tenderness present.       Comments: There is tenderness to palpation right anterior/inferior chest extending  laterally as noted on diagram.   Abdominal:     General: Bowel sounds are normal. There is no distension.     Palpations: Abdomen is soft.     Tenderness: There is abdominal tenderness in the right upper quadrant. There is no right CVA tenderness or left CVA tenderness. Positive signs include Murphy's sign. Negative signs include McBurney's sign.   Musculoskeletal:     Cervical back: Neck supple.     Right lower leg: No edema.     Left lower leg: No edema.  Lymphadenopathy:     Cervical: No cervical adenopathy.  Skin:    General: Skin is warm and dry.     Findings: No rash.  Neurological:     Mental Status: He is alert and oriented to person, place, and time.      UC Treatments / Results  Labs (all labs ordered are listed, but only abnormal results are displayed) Labs Reviewed  COMPREHENSIVE METABOLIC PANEL WITH GFR  CBC WITH DIFFERENTIAL/PLATELET    EKG   Radiology DG Ribs Unilateral W/Chest Right Result Date: 09/18/2024 CLINICAL DATA:  Right anterior rib pain for 2 weeks, worsening. Shortness of breath. EXAM: RIGHT RIBS AND CHEST - 3+ VIEW COMPARISON:  08/30/2008 FINDINGS: Shallow inspiration. Heart size and pulmonary vascularity are normal for technique. Linear atelectasis in the lung bases. No pleural effusion or pneumothorax. Mediastinal contours appear intact. Right rib detail views demonstrate no evidence of acute fracture or displacement. No focal bone lesion or bone destruction. Bone cortex appears intact. Soft tissues are unremarkable. IMPRESSION: 1. Shallow inspiration with linear atelectasis in the lung bases. 2. Negative right  ribs. Electronically Signed   By: Elsie Gravely M.D.   On: 09/18/2024 20:28    Procedures Procedures (including critical care time)  Medications Ordered in UC Medications - No data to display  Initial Impression / Assessment and Plan / UC Course  I have reviewed the triage vital signs and the nursing notes.  Pertinent labs &  imaging results that were available during my care of the patient were reviewed by me and considered in my medical decision making (see chart for details).    Patient's exam is significant for right upper quadrant abdominal tenderness as well as right anterior/inferior rib tenderness.  Note low grade temperature. CBC and CMP pending. Rx for Vicodin 5-325 (#12, no refill).  Controlled Substance Prescriptions I have consulted the [place] Controlled Substances Registry for this patient, and feel the risk/benefit ratio today is favorable for proceeding with this prescription for a controlled substance.  Followup with Family Doctor in 3 to 5 days.  Will need CT abdomen or possibly US  right upper quadrant.  Final Clinical Impressions(s) / UC Diagnoses   Final diagnoses:  Rib pain on right side  Abdominal pain, right upper quadrant     Discharge Instructions      May take Ibuprofen 200mg , 4 tabs every 8 hours with food as needed.  Rest.  Increase fluid intake.    ED Prescriptions     Medication Sig Dispense Auth. Provider   HYDROcodone-acetaminophen (NORCO/VICODIN) 5-325 MG tablet Take 1 tablet by mouth every 6 (six) hours as needed for moderate pain (pain score 4-6) or severe pain (pain score 7-10). 12 tablet Pauline Garnette LABOR, MD         Pauline Garnette LABOR, MD 09/19/24 [phone]

## 2024-09-20 LAB — COMPREHENSIVE METABOLIC PANEL WITH GFR
ALT: 26 IU/L (ref 0–44)
AST: 36 IU/L (ref 0–40)
Albumin: 4.3 g/dL (ref 3.8–4.9)
Alkaline Phosphatase: 50 IU/L (ref 47–123)
BUN/Creatinine Ratio: 10 (ref 9–20)
BUN: 12 mg/dL (ref 6–24)
Bilirubin Total: 0.5 mg/dL (ref 0.0–1.2)
CO2: 22 mmol/L (ref 20–29)
Calcium: 9.4 mg/dL (ref 8.7–10.2)
Chloride: 104 mmol/L (ref 96–106)
Creatinine, Ser: 1.26 mg/dL (ref 0.76–1.27)
Globulin, Total: 2.2 g/dL (ref 1.5–4.5)
Glucose: 107 mg/dL — ABNORMAL HIGH (ref 70–99)
Potassium: 4.2 mmol/L (ref 3.5–5.2)
Sodium: 141 mmol/L (ref 134–144)
Total Protein: 6.5 g/dL (ref 6.0–8.5)
eGFR: 69 mL/min/1.73 (ref >59)

## 2024-09-20 LAB — CBC WITH DIFFERENTIAL/PLATELET
Basophils Absolute: 0 x10E3/uL (ref 0.0–0.2)
Basos: 1 %
EOS (ABSOLUTE): 0.1 x10E3/uL (ref 0.0–0.4)
Eos: 1 %
Hematocrit: 45.8 % (ref 37.5–51.0)
Hemoglobin: 14.6 g/dL (ref 13.0–17.7)
Immature Grans (Abs): 0 x10E3/uL (ref 0.0–0.1)
Immature Granulocytes: 0 %
Lymphocytes Absolute: 2.5 x10E3/uL (ref 0.7–3.1)
Lymphs: 34 %
MCH: 26.7 pg (ref 26.6–33.0)
MCHC: 31.9 g/dL (ref 31.5–35.7)
MCV: 84 fL (ref 79–97)
Monocytes Absolute: 0.4 x10E3/uL (ref 0.1–0.9)
Monocytes: 6 %
Neutrophils Absolute: 4.2 x10E3/uL (ref 1.4–7.0)
Neutrophils: 57 %
Platelets: 280 x10E3/uL (ref 150–450)
RBC: 5.47 x10E6/uL (ref 4.14–5.80)
RDW: 15.9 % — ABNORMAL HIGH (ref 11.6–15.4)
WBC: 7.2 x10E3/uL (ref 3.4–10.8)

## 2024-09-21 ENCOUNTER — Ambulatory Visit (HOSPITAL_COMMUNITY): Payer: Self-pay

## 2024-09-29 ENCOUNTER — Encounter: Payer: Self-pay | Admitting: Family Medicine

## 2024-09-29 ENCOUNTER — Ambulatory Visit: Admitting: Family Medicine

## 2024-09-29 VITALS — BP 110/70 | HR 102 | Temp 98.9°F | Ht 69.0 in | Wt 218.0 lb

## 2024-09-29 DIAGNOSIS — Z7985 Long-term (current) use of injectable non-insulin antidiabetic drugs: Secondary | ICD-10-CM

## 2024-09-29 DIAGNOSIS — E349 Endocrine disorder, unspecified: Secondary | ICD-10-CM | POA: Diagnosis not present

## 2024-09-29 DIAGNOSIS — Z23 Encounter for immunization: Secondary | ICD-10-CM | POA: Diagnosis not present

## 2024-09-29 DIAGNOSIS — N182 Chronic kidney disease, stage 2 (mild): Secondary | ICD-10-CM

## 2024-09-29 DIAGNOSIS — I10 Essential (primary) hypertension: Secondary | ICD-10-CM | POA: Diagnosis not present

## 2024-09-29 DIAGNOSIS — N521 Erectile dysfunction due to diseases classified elsewhere: Secondary | ICD-10-CM | POA: Insufficient documentation

## 2024-09-29 DIAGNOSIS — E559 Vitamin D deficiency, unspecified: Secondary | ICD-10-CM

## 2024-09-29 DIAGNOSIS — E1122 Type 2 diabetes mellitus with diabetic chronic kidney disease: Secondary | ICD-10-CM | POA: Insufficient documentation

## 2024-09-29 DIAGNOSIS — E782 Mixed hyperlipidemia: Secondary | ICD-10-CM

## 2024-09-29 DIAGNOSIS — Z1283 Encounter for screening for malignant neoplasm of skin: Secondary | ICD-10-CM

## 2024-09-29 DIAGNOSIS — R0789 Other chest pain: Secondary | ICD-10-CM | POA: Insufficient documentation

## 2024-09-29 MED ORDER — LOSARTAN POTASSIUM-HCTZ 50-12.5 MG PO TABS: ORAL_TABLET | ORAL | 3 refills | Status: AC

## 2024-09-29 MED ORDER — TIRZEPATIDE 15 MG/0.5ML ~~LOC~~ SOAJ: SUBCUTANEOUS | 3 refills | Status: AC

## 2024-09-29 MED ORDER — ROSUVASTATIN CALCIUM 40 MG PO TABS: ORAL_TABLET | ORAL | 3 refills | Status: AC

## 2024-09-29 MED ORDER — TADALAFIL 20 MG PO TABS: ORAL_TABLET | ORAL | 3 refills | Status: AC

## 2024-09-29 NOTE — Assessment & Plan Note (Signed)
 Taking losartan -hydrochlorothiazide 50-12.5 mg daily Denies chest pain, shortness of breath, vision changes, or headaches.  Does not check at home. Well controlled in the office today. Recent CMP normal.  Refills sent. Follow-up in 6 months.

## 2024-09-29 NOTE — Assessment & Plan Note (Signed)
 Influenza vaccine today. Tolerated well.

## 2024-09-29 NOTE — Assessment & Plan Note (Signed)
 Testosterone  1 mL every 7 days per previous PCP. Referral sent to urology for ongoing care. Last testosterone  level 1745.   Check testosterone  today.

## 2024-09-29 NOTE — Assessment & Plan Note (Addendum)
 Taking Mounjaro  15 mg every 13 days for diabetes.  Last A1C in January 5.5 A1C, urine microalbumin, foot exam today.   Up-to-date on eye exam, done January 2025. Recommend Mounjaro  15 mg weekly instead of every 13 days for diabetes control and weight loss. Refills sent. Follow-up in 6 months.

## 2024-09-29 NOTE — Assessment & Plan Note (Signed)
 Went to urgent care September 18, 2024 for right rib pain, possibly from new lawn more pressing up against his chest. Symptoms have improved, x-ray shows atelectasis. Incentive spirometer given and instructed to use at least 10 times every hour while awake for the next week to prevent further atelectasis. Lungs clear and oxygen saturation 97%.

## 2024-09-29 NOTE — Assessment & Plan Note (Signed)
 Taking over-the-counter vitamin D  supplement 4000 international units daily. Last vitamin D  check December 19, 2023 level with 86. Recheck today.

## 2024-09-29 NOTE — Assessment & Plan Note (Signed)
 Taking rosuvastatin  40 mg daily Tolerates well. No muscle aches. Refill sent. Lipid panel today. Follow-up in 6 months.

## 2024-09-29 NOTE — Assessment & Plan Note (Signed)
 Taking  tadalafil  20 mg 1/2-1 tablets Q 2-3 days as needed.  Refill sent.

## 2024-09-29 NOTE — Progress Notes (Signed)
 New Patient Office Visit  Subjective    Patient ID: Mike Cohen, male    DOB: [birth year]  Age: 53 y.o. MRN: [id number]  CC:  Chief Complaint  Patient presents with   Establish Care    Soreness under right ribcage.  Went to ER last weekend. X-ray did not show anything. Would like referral to Dermatologist for bumps on back of head  Would like to take the flu shot today    HPI Mike Cohen presents to establish care with this practice. He is new to me. Transferring to this practice due to PCP passing away.   HTN: losartan -hydrochlorothiazide 50-12.5 mg daily Denies chest pain, shortness of breath, vision changes, or headaches.  Does not check at home. Well controlled in the office today. Recent CMP normal.    Obesity: phentermine  37.5 mg daily, Topiramate  50 mg BID (short term solution)  Discussed stopping this and taking Mounjaro  weekly for diabetes and weight loss.   HLD: rosuvastatin  40 mg daily Tolerates well. No muscle aches.  DM: tripeptide 15 mg around every 13 days. Recommend weekly dosing for diabetes and weight loss instead of phentermine  and Topamax .  Testosterone  1 ml Q 7 days : will check levels today and send to urology for ongoing care.  Last testosterone : 1745   ED:  tadalafil  20 mg 1/2-1 tablets Q 2-3 days as needed.  Vitamin D  def: 12/19/23: 86 Taking 4000 international units daily. Recheck today.   Dermatology referral for bumps on back of head and skin cancer check. Referral placed.   12/19/23: A1C 5.5, down from 6.0 LDL 73 GFR: 70, K+ 4.0 OSA: off CPAP due to weight loss  09/18/24: urgent care for rib pain on right  possibly from new lawn  mower pressed up again chest.  This has improved, x-ray shows atelectasis, IS given  CBC: normal  CMP: normal   Left sided rib pain: possibly from new lawn  mower pressed up again chest.     Outpatient Encounter Medications as of 09/29/2024  Medication Sig   aspirin EC 81 MG tablet Take 81 mg  by mouth daily.   Cholecalciferol (VITAMIN D  PO) Take 2,000 Int'l Units by mouth daily.   losartan -hydrochlorothiazide (HYZAAR) 50-12.5 MG tablet Take 1 tablet by mouth once daily for blood pressure   Multiple Vitamin (MULTIVITAMIN) tablet Take 1 tablet by mouth daily.   phentermine  (ADIPEX-P ) 37.5 MG tablet Take 1/2 to 1 tablet every Morning for Dieting & Weight Loss                                                                                    /                               TAKE                                 BY  MOUTH                                           ONCE ? ? ? DAILY   rosuvastatin  (CRESTOR ) 40 MG tablet Take  1 tablet   Daily for  Cholesterol                               /                                                                   TAKE                                         BY                                                 MOUTH   SYRINGE-NEEDLE, DISP, 5 ML (SAFETY SYRINGES/NEEDLE) 22G X 1-1/2 5 ML MISC Use tor testosterone  injections   tadalafil  (CIALIS ) 20 MG tablet Take  1/2 to 1 tablet  every 2 to 3 days  as needed XXXX                                        /                                                                   TAKE                                         BY                                                 MOUTH   testosterone  cypionate (DEPOTESTOSTERONE CYPIONATE) 200 MG/ML injection Inject  1 ml   into Muscle  every 7 days   tirzepatide  (MOUNJARO ) 15 MG/0.5ML Pen Inject   1 pen (15 mg)   into Skin   every 7 days   for Diabetes (Dx: e11.29)   topiramate  (TOPAMAX ) 50 MG tablet Take  1 tablet  2 x /day at Supper & Bedtime for Dieting & Weight  Loss                                /  TAKE                                         BY                                                 MOUTH   [DISCONTINUED] losartan -hydrochlorothiazide (HYZAAR) 50-12.5 MG tablet  Take 1 tablet by mouth once daily for blood pressure   [DISCONTINUED] rosuvastatin  (CRESTOR ) 40 MG tablet Take  1 tablet   Daily for  Cholesterol                               /                                                                   TAKE                                         BY                                                 MOUTH   [DISCONTINUED] tadalafil  (CIALIS ) 20 MG tablet Take  1/2 to 1 tablet  every 2 to 3 days  as needed XXXX                                        /                                                                   TAKE                                         BY                                                 MOUTH   [DISCONTINUED] tirzepatide  (MOUNJARO ) 15 MG/0.5ML Pen Inject   1 pen (15 mg)   into Skin   every 7 days   for Diabetes (Dx: e11.29)   HYDROcodone-acetaminophen (NORCO/VICODIN) 5-325 MG tablet Take 1 tablet by mouth every 6 (six) hours as needed for moderate pain (pain score 4-6) or severe pain (pain score 7-10). (Patient not taking:  Reported on 09/29/2024)   ipratropium (ATROVENT ) 0.06 % nasal spray Use 1 to 2 sprays each nostril 2 to 3 x /day as needed for watery nasal drainage (Patient not taking: Reported on 09/29/2024)   No facility-administered encounter medications on file as of 09/29/2024.    Past Medical History:  Diagnosis Date   Diabetes mellitus without complication (HCC)    Hyperlipidemia    Hypertension    Other testicular hypofunction    Prediabetes    Sleep apnea     Past Surgical History:  Procedure Laterality Date   ROTATOR CUFF REPAIR Right 2007   SHOULDER ARTHROSCOPY Right 2000    Family History  Problem Relation Age of Onset   Healthy Mother    Heart disease Father    Hyperlipidemia Father    Hypertension Father    Colon cancer Neg Hx    Esophageal cancer Neg Hx    Pancreatic cancer Neg Hx     Social History   Socioeconomic History   Marital status: Married    Spouse name: Not on file   Number of  children: Not on file   Years of education: Not on file   Highest education level: Some college, no degree  Occupational History   Not on file  Tobacco Use   Smoking status: Former    Types: Cigars   Smokeless tobacco: Former   Tobacco comments:    occasional cigar  Vaping Use   Vaping status: Former  Substance and Sexual Activity   Alcohol use: Yes    Alcohol/week: 0.0 standard drinks of alcohol    Comment: occasional   Drug use: No   Sexual activity: Yes  Other Topics Concern   Not on file  Social History Narrative   Not on file   Social Drivers of Health   Financial Resource Strain: Low Risk  (09/28/2024)   Overall Financial Resource Strain (CARDIA)    Difficulty of Paying Living Expenses: Not hard at all  Food Insecurity: No Food Insecurity (09/28/2024)   Hunger Vital Sign    Worried About Running Out of Food in the Last Year: Never true    Ran Out of Food in the Last Year: Never true  Transportation Needs: No Transportation Needs (09/28/2024)   PRAPARE - Administrator, Civil Service (Medical): No    Lack of Transportation (Non-Medical): No  Physical Activity: Insufficiently Active (09/28/2024)   Exercise Vital Sign    Days of Exercise per Week: 2 days    Minutes of Exercise per Session: 10 min  Stress: No Stress Concern Present (09/28/2024)   [employer] of Occupational Health - Occupational Stress Questionnaire    Feeling of Stress: Not at all  Social Connections: Unknown (09/28/2024)   Social Connection and Isolation Panel    Frequency of Communication with Friends and Family: More than three times a week    Frequency of Social Gatherings with Friends and Family: Once a week    Attends Religious Services: Patient declined    Database administrator or Organizations: Patient declined    Attends Engineer, structural: Not on file    Marital Status: Married  Catering manager Violence: Not on file    ROS      Objective    BP  110/70 (BP Location: Left Arm, Patient Position: Sitting, Cuff Size: Normal)   Pulse (!) 102   Temp 98.9 F (37.2 C) (Oral)   Ht 5' 9 (1.753 m)   Wt 218  lb (98.9 kg)   SpO2 97%   BMI 32.19 kg/m   Physical Exam Vitals and nursing note reviewed.  Constitutional:      General: He is not in acute distress.    Appearance: Normal appearance.  Cardiovascular:     Rate and Rhythm: Normal rate and regular rhythm.     Heart sounds: Normal heart sounds.  Pulmonary:     Effort: Pulmonary effort is normal.     Breath sounds: Normal breath sounds.  Skin:    General: Skin is warm and dry.  Neurological:     General: No focal deficit present.     Mental Status: He is alert. Mental status is at baseline.  Psychiatric:        Mood and Affect: Mood normal.        Behavior: Behavior normal.        Thought Content: Thought content normal.        Judgment: Judgment normal.         Assessment & Plan:   Primary hypertension Assessment & Plan: Taking losartan -hydrochlorothiazide 50-12.5 mg daily Denies chest pain, shortness of breath, vision changes, or headaches.  Does not check at home. Well controlled in the office today. Recent CMP normal.  Refills sent. Follow-up in 6 months.    Orders: -     Losartan  Potassium-HCTZ; Take 1 tablet by mouth once daily for blood pressure  Dispense: 90 tablet; Refill: 3  Testosterone  deficiency Assessment & Plan: Testosterone  1 mL every 7 days per previous PCP. Referral sent to urology for ongoing care. Last testosterone  level 1745.   Check testosterone  today.  Orders: -     CBC -     Testosterone ,Free and Total -     Ambulatory referral to Urology  Hyperlipidemia, mixed Assessment & Plan: Taking rosuvastatin  40 mg daily Tolerates well. No muscle aches. Refill sent. Lipid panel today. Follow-up in 6 months.    Orders: -     Lipid panel -     Rosuvastatin  Calcium ; Take  1 tablet   Daily for  Cholesterol                                /                                                                   TAKE                                         BY                                                 MOUTH  Dispense: 90 tablet; Refill: 3  Vitamin D  deficiency Assessment & Plan: Taking over-the-counter vitamin D  supplement 4000 international units daily. Last vitamin D  check December 19, 2023 level with 86. Recheck today.  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  Refill sent.   Orders: -     Tadalafil ; Take  1/2 to 1 tablet  every 2 to 3 days  as needed XXXX                                        /                                                                   TAKE                                         BY                                                 MOUTH  Dispense: 30 tablet; Refill: 3  Type 2 diabetes mellitus with stage 2 chronic kidney disease, without long-term current use of insulin  (HCC) Assessment & Plan: Taking Mounjaro  15 mg every 13 days for diabetes.  Last A1C in January 5.5 A1C, urine microalbumin, foot exam today.   Up-to-date on eye exam, done January 2025. Recommend Mounjaro  15 mg weekly instead of every 13 days for diabetes control and weight loss. Refills sent. Follow-up in 6 months.  Orders: -     Hemoglobin A1c -     Tirzepatide ; Inject   1 pen (15 mg)   into Skin   every 7 days   for Diabetes (Dx: e11.29)  Dispense: 6 mL; Refill: 3 -     Microalbumin / creatinine urine ratio  Screening for skin cancer Assessment & Plan: Influenza vaccine today. Tolerated well.   Orders: -     Ambulatory referral to Dermatology  Encounter for administration of vaccine Assessment & Plan: Influenza vaccine today. Tolerated well.   Orders: -     Flu vaccine trivalent PF, 6mos and older(Flulaval,Afluria,Fluarix,Fluzone)  Rib pain on  right side Assessment & Plan: Santina to urgent care September 18, 2024 for right rib pain, possibly from new lawn more pressing up against his chest. Symptoms have improved, x-ray shows atelectasis. Incentive spirometer given and instructed to use at least 10 times every hour while awake for the next week to prevent further atelectasis. Lungs clear and oxygen saturation 97%.       Return in about 6 months (around 03/30/2025) for HTN, DM, ED, HLD, .   Darice JONELLE Brownie, FNP

## 2024-09-30 ENCOUNTER — Ambulatory Visit: Payer: Self-pay | Admitting: Family Medicine

## 2024-09-30 DIAGNOSIS — E782 Mixed hyperlipidemia: Secondary | ICD-10-CM

## 2024-09-30 LAB — HEMOGLOBIN A1C
Est. average glucose Bld gHb Est-mCnc: 108 mg/dL
Hgb A1c MFr Bld: 5.4 % (ref 4.8–5.6)

## 2024-09-30 LAB — LIPID PANEL
Chol/HDL Ratio: 6 ratio — ABNORMAL HIGH (ref 0.0–5.0)
Cholesterol, Total: 199 mg/dL (ref 100–199)
HDL: 33 mg/dL — ABNORMAL LOW (ref >39)
LDL Chol Calc (NIH): 132 mg/dL — ABNORMAL HIGH (ref 0–99)
Triglycerides: 186 mg/dL — ABNORMAL HIGH (ref 0–149)
VLDL Cholesterol Cal: 34 mg/dL (ref 5–40)

## 2024-09-30 LAB — CBC
Hematocrit: 49 % (ref 37.5–51.0)
Hemoglobin: 15.6 g/dL (ref 13.0–17.7)
MCH: 27.2 pg (ref 26.6–33.0)
MCHC: 31.8 g/dL (ref 31.5–35.7)
MCV: 85 fL (ref 79–97)
Platelets: 238 x10E3/uL (ref 150–450)
RBC: 5.74 x10E6/uL (ref 4.14–5.80)
RDW: 15.7 % — ABNORMAL HIGH (ref 11.6–15.4)
WBC: 4.3 x10E3/uL (ref 3.4–10.8)

## 2024-09-30 LAB — MICROALBUMIN / CREATININE URINE RATIO
Creatinine, Urine: 229.4 mg/dL
Microalb/Creat Ratio: 4 mg/g{creat} (ref 0–29)
Microalbumin, Urine: 9.7 ug/mL

## 2024-09-30 LAB — TESTOSTERONE,FREE AND TOTAL
Testosterone, Free: 1.7 pg/mL — ABNORMAL LOW (ref 7.2–24.0)
Testosterone: 114 ng/dL — ABNORMAL LOW (ref 264–916)

## 2024-09-30 LAB — VITAMIN D 25 HYDROXY (VIT D DEFICIENCY, FRACTURES): Vit D, 25-Hydroxy: 42.4 ng/mL (ref 30.0–100.0)

## 2024-10-01 MED ORDER — EZETIMIBE 10 MG PO TABS: 10.0000 mg | ORAL_TABLET | Freq: Every day | ORAL | 1 refills | Status: AC

## 2024-10-21 NOTE — Progress Notes (Signed)
 Chief Complaint: Low testosterone  level, ED  History of Present Illness:  53 year old male presents at this time for further evaluation and management of low testosterone  levels.  Most recent testosterone  data: February/2023--152 September/2023--1400 June/2024--1947 January/2025--1745 October/2025--114  He is a tenured Event organiser.  He began testosterone  repletion 10 years ago.  He started this with Dr. Elsie Richards.  Typical injections were on Saturdays or Sundays, 200 mg IM once a week.  His hemoglobin levels have always been normal, most recent PSA in June 2024 was 0.38.  No significant lower urinary tract concerns.  Does have ED.  Not working that well with 20 mg of Cialis . Past Medical History:  Past Medical History:  Diagnosis Date   Diabetes mellitus without complication (HCC)    Hyperlipidemia    Hypertension    Other testicular hypofunction    Prediabetes    Sleep apnea     Past Surgical History:  Past Surgical History:  Procedure Laterality Date   ROTATOR CUFF REPAIR Right 2007   SHOULDER ARTHROSCOPY Right 2000    Allergies:  Allergies  Allergen Reactions   Amoxicillin Nausea Only    Family History:  Family History  Problem Relation Age of Onset   Healthy Mother    Heart disease Father    Hyperlipidemia Father    Hypertension Father    Colon cancer Neg Hx    Esophageal cancer Neg Hx    Pancreatic cancer Neg Hx     Social History:  Social History   Tobacco Use   Smoking status: Former    Types: Cigars   Smokeless tobacco: Former   Tobacco comments:    occasional cigar  Vaping Use   Vaping status: Former  Substance Use Topics   Alcohol use: Yes    Alcohol/week: 0.0 standard drinks of alcohol    Comment: occasional   Drug use: No    Review of symptoms:  Constitutional:  Negative for unexplained weight loss, night sweats, fever, chills ENT:  Negative for nose bleeds, sinus pain, painful swallowing CV:  Negative for  chest pain, shortness of breath, exercise intolerance, palpitations, loss of consciousness Resp:  Negative for cough, wheezing, shortness of breath GI:  Negative for nausea, vomiting, diarrhea, bloody stools GU:  Positives noted in HPI; otherwise negative for gross hematuria, dysuria, urinary incontinence Neuro:  Negative for seizures, poor balance, limb weakness, slurred speech Psych:  Negative for lack of energy, depression, anxiety Endocrine:  Negative for polydipsia, polyuria, symptoms of hypoglycemia (dizziness, hunger, sweating) Hematologic:  Negative for anemia, purpura, petechia, prolonged or excessive bleeding, use of anticoagulants  Allergic:  Negative for difficulty breathing or choking as a result of exposure to anything; no shellfish allergy; no allergic response (rash/itch) to materials, foods  Physical exam: There were no vitals taken for this visit. GENERAL APPEARANCE:  Well appearing, well developed, well nourished, NAD HEENT: Atraumatic, Normocephalic. NECK: Normal appearance LUNGS: Normal inspiratory and expiratory excursion HEART: Regular Rate ABDOMEN: No inguinal hernias GU: Phallus normal, no lesions. Scrotal skin normal. Testicles/epididymal structures normal except for slight testicular atrophy bilaterally. Meatus normal.  EXTREMITIES: Moves all extremities well.  Without clubbing, cyanosis, or edema. NEUROLOGIC:  Alert and oriented x 3, normal gait, CN II-XII grossly intact.  MENTAL STATUS:  Appropriate. SKIN:  Warm, dry and intact.    Results:  I have reviewed referring/prior physicians notes  I have reviewed PSA results from June 2024--0.38    Assessment: 1.  Hypogonadism.  On supraphysiologic dosing of testosterone   thus far.  With him being off of testosterone  recently, it was quite low at 114.  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  He does have diabetes which is under good control.  Tadalafil  not working that well   Plan: 1.  I  will restart him on repletion, but with hopefully physiologic dosing-testosterone  cypionate 100 mg IM weekly.  Prescription was provided  2.  I will have him drop in in a couple of months for testosterone  panel as well as hematocrit/hemoglobin  3.  I will give him a prescription for sildenafil .  If he finds that this does not work well, I would have him consider intracorporal injections with prostaglandin.  He will call us  if he thinks he needs to have this done  4.  Follow-up here in 1 year

## 2024-10-26 ENCOUNTER — Ambulatory Visit: Admitting: Urology

## 2024-10-26 VITALS — BP 116/79 | HR 88 | Ht 69.0 in | Wt 205.0 lb

## 2024-10-26 DIAGNOSIS — N529 Male erectile dysfunction, unspecified: Secondary | ICD-10-CM | POA: Diagnosis not present

## 2024-10-26 DIAGNOSIS — E291 Testicular hypofunction: Secondary | ICD-10-CM | POA: Diagnosis not present

## 2024-10-26 MED ORDER — SILDENAFIL CITRATE 100 MG PO TABS: ORAL_TABLET | ORAL | 99 refills | Status: AC

## 2024-10-26 MED ORDER — TESTOSTERONE CYPIONATE 200 MG/ML IM SOLN
100.0000 mg | INTRAMUSCULAR | 1 refills | Status: AC
Start: 2024-10-26 — End: ?

## 2024-12-16 ENCOUNTER — Ambulatory Visit

## 2024-12-16 ENCOUNTER — Ambulatory Visit
Admission: EM | Admit: 2024-12-16 | Discharge: 2024-12-16 | Disposition: A | Discharge status: Home / Self Care | Attending: Family Medicine | Admitting: Family Medicine

## 2024-12-16 DIAGNOSIS — S39012A Strain of muscle, fascia and tendon of lower back, initial encounter: Secondary | ICD-10-CM | POA: Diagnosis not present

## 2024-12-16 DIAGNOSIS — M545 Low back pain, unspecified: Secondary | ICD-10-CM | POA: Diagnosis not present

## 2024-12-16 DIAGNOSIS — M62830 Muscle spasm of back: Secondary | ICD-10-CM

## 2024-12-16 MED ORDER — METHOCARBAMOL 500 MG PO TABS: 500.0000 mg | ORAL_TABLET | Freq: Two times a day (BID) | ORAL | 0 refills | Status: AC

## 2024-12-16 MED ORDER — PREDNISONE 10 MG (21) PO TBPK: ORAL_TABLET | Freq: Every day | ORAL | 0 refills | Status: AC

## 2024-12-16 MED ORDER — CELECOXIB 200 MG PO CAPS: 200.0000 mg | ORAL_CAPSULE | Freq: Every day | ORAL | 0 refills | Status: AC

## 2024-12-16 NOTE — Discharge Instructions (Addendum)
 Advised patient of lumbar spine x-ray results with hardcopy provided.  Advised patient take medications (Sterapred Unipak and Celebrex ) as directed with food to completion.  Advised may use Robaxin  daily or as needed for muscle spasms of back encouraged increase daily water intake to 64 ounces per day while taking these medications.

## 2024-12-16 NOTE — ED Provider Notes (Signed)
 " [name]    CSN: [id number] Arrival date & time: 12/16/24  1647      History   Chief Complaint Chief Complaint  Patient presents with   Back Pain    Lower LT    HPI Mike Cohen is a 54 y.o. male.   HPI Pleasant 54 year old male presents with back pain intermittently x 6 months.  Patient reports back pain is worse with movement.  PMH significant for obesity, T2DM, testosterone  deficiency, and HTN.  Past Medical History:  Diagnosis Date   Diabetes mellitus without complication (HCC)    Hyperlipidemia    Hypertension    Other testicular hypofunction    Prediabetes    Sleep apnea     Patient Active Problem List   Diagnosis Date Noted   Erectile dysfunction due to diseases classified elsewhere 09/29/2024   Type 2 diabetes mellitus with stage 2 chronic kidney disease, without long-term current use of insulin  (HCC) 09/29/2024   Rib pain on right side 09/29/2024   Viral upper respiratory tract infection 09/15/2020   Intercostal pain 09/15/2020   Hepatic steatosis 03/27/2019   Obesity (BMI 30.0-34.9) 09/19/2015   Encounter for administration of vaccine 04/02/2014   Vitamin D  deficiency 04/02/2014   OSA on CPAP 04/02/2014   IBS 04/02/2014   Hypertension    Testosterone  deficiency    Hyperlipidemia, mixed     Past Surgical History:  Procedure Laterality Date   ROTATOR CUFF REPAIR Right 2007   SHOULDER ARTHROSCOPY Right 2000       Home Medications    Prior to Admission medications  Medication Sig Start Date End Date Taking? Authorizing Provider  celecoxib  (CELEBREX ) 200 MG capsule Take 1 capsule (200 mg total) by mouth daily for 15 days. 12/16/24 12/31/24 Yes Teddy Sharper, FNP  methocarbamol  (ROBAXIN ) 500 MG tablet Take 1 tablet (500 mg total) by mouth 2 (two) times daily. 12/16/24  Yes Teddy Sharper, FNP  predniSONE  (STERAPRED UNI-PAK 21 TAB) 10 MG (21) TBPK tablet Take by mouth daily. Take 6 tabs by mouth daily  for 2 days, then 5 tabs for 2  days, then 4 tabs for 2 days, then 3 tabs for 2 days, 2 tabs for 2 days, then 1 tab by mouth daily for 2 days 12/16/24  Yes Teddy Sharper, FNP  aspirin EC 81 MG tablet Take 81 mg by mouth daily.    [provider]  Cholecalciferol (VITAMIN D  PO) Take 2,000 Int'l Units by mouth daily.    [provider]  ezetimibe  (ZETIA ) 10 MG tablet Take 1 tablet (10 mg total) by mouth daily. 10/01/24   Booker Darice SAUNDERS, FNP  ipratropium (ATROVENT ) 0.06 % nasal spray Use 1 to 2 sprays each nostril 2 to 3 x /day as needed for watery nasal drainage Patient not taking: Reported on 10/26/2024 05/11/22   Jeanine Knee, NP  losartan -hydrochlorothiazide (HYZAAR) 50-12.5 MG tablet Take 1 tablet by mouth once daily for blood pressure 09/29/24   Booker Darice SAUNDERS, FNP  Multiple Vitamin (MULTIVITAMIN) tablet Take 1 tablet by mouth daily.    [provider]  phentermine  (ADIPEX-P ) 37.5 MG tablet Take 1/2 to 1 tablet every Morning for Dieting & Weight Loss                                                                                    /  TAKE                                 BY                                              MOUTH                                           ONCE ? ? ? DAILY 02/26/24   Webb, Padonda B, FNP  rosuvastatin  (CRESTOR ) 40 MG tablet Take  1 tablet   Daily for  Cholesterol                               /                                                                   TAKE                                         BY                                                 MOUTH 09/29/24   Booker Darice SAUNDERS, FNP  sildenafil  (VIAGRA ) 100 MG tablet Take 1/2 to 1 tablet p.o. as needed 10/26/24   Matilda Senior, MD  SYRINGE-NEEDLE, DISP, 5 ML (SAFETY SYRINGES/NEEDLE) 22G X 1-1/2 5 ML MISC Use tor testosterone  injections 06/03/23   Tonita Fallow, MD  tadalafil  (CIALIS ) 20 MG tablet Take  1/2 to 1 tablet  every 2 to 3 days  as needed XXXX                                         /                                                                   TAKE                                         BY  MOUTH 09/29/24   Booker Darice SAUNDERS, FNP  testosterone  cypionate (DEPOTESTOSTERONE CYPIONATE) 200 MG/ML injection INJECT 1 ML (CC) INTRAMUSCULARLY ONCE A WEEK 11/18/24   Matilda Senior, MD  testosterone  cypionate (DEPOTESTOSTERONE CYPIONATE) 200 MG/ML injection Inject 0.5 mLs (100 mg total) into the muscle every 7 (seven) days. 10/26/24   Matilda Senior, MD  tirzepatide  (MOUNJARO ) 15 MG/0.5ML Pen Inject   1 pen (15 mg)   into Skin   every 7 days   for Diabetes (Dx: e11.29) 09/29/24   Booker Darice SAUNDERS, FNP  topiramate  (TOPAMAX ) 50 MG tablet Take  1 tablet  2 x /day at Supper & Bedtime for Dieting & Weight  Loss                                /                                                                   TAKE                                         BY                                                 MOUTH 02/26/24   Douglass Kenney NOVAK, FNP    Family History Family History  Problem Relation Age of Onset   Healthy Mother    Heart disease Father    Hyperlipidemia Father    Hypertension Father    Colon cancer Neg Hx    Esophageal cancer Neg Hx    Pancreatic cancer Neg Hx     Social History Social History[1]   Allergies   Amoxicillin   Review of Systems Review of Systems  Musculoskeletal:  Positive for back pain.  All other systems reviewed and are negative.    Physical Exam Triage Vital Signs ED Triage Vitals  Encounter Vitals Group     BP      Girls Systolic BP Percentile      Girls Diastolic BP Percentile      Boys Systolic BP Percentile      Boys Diastolic BP Percentile      Pulse      Resp      Temp      Temp src      SpO2      Weight      Height      Head Circumference      Peak Flow      Pain Score      Pain Loc      Pain Education      Exclude from Growth Chart    No data found.  Updated  Vital Signs BP 127/84 (BP Location: Right Arm)   Pulse 92   Temp 98.6 F (37 C) (Oral)   SpO2 98%    Physical Exam Vitals and nursing note reviewed.  Constitutional:      Appearance: Normal appearance. He  is obese.  HENT:     Head: Normocephalic and atraumatic.     Mouth/Throat:     Mouth: Mucous membranes are moist.     Pharynx: Oropharynx is clear.  Eyes:     Extraocular Movements: Extraocular movements intact.     Conjunctiva/sclera: Conjunctivae normal.     Pupils: Pupils are equal, round, and reactive to light.  Cardiovascular:     Rate and Rhythm: Normal rate and regular rhythm.     Heart sounds: Normal heart sounds.  Pulmonary:     Effort: Pulmonary effort is normal.     Breath sounds: Normal breath sounds. No wheezing, rhonchi or rales.  Musculoskeletal:        General: Normal range of motion.     Comments: Lumbar sacral spine: Patient reporting pain over left sided L5/S1 region, reports more pain with movement  Skin:    General: Skin is warm and dry.  Neurological:     General: No focal deficit present.     Mental Status: He is alert and oriented to person, place, and time. Mental status is at baseline.  Psychiatric:        Mood and Affect: Mood normal.        Behavior: Behavior normal.      UC Treatments / Results  Labs (all labs ordered are listed, but only abnormal results are displayed) Labs Reviewed - No data to display  EKG   Radiology DG Lumbar Spine Complete Result Date: 12/16/2024 EXAM: 4 VIEW(S) XRAY OF THE LUMBAR SPINE 12/16/2024 05:28:00 PM COMPARISON: None available. CLINICAL HISTORY: Low back pain for 6 months Low back pain for 6 months FINDINGS: LUMBAR SPINE: BONES: Vertebral body heights are maintained. Alignment is normal. DISCS AND DEGENERATIVE CHANGES: No severe degenerative changes. SOFT TISSUES: No acute abnormality. IMPRESSION: 1. No significant abnormality of the lumbar spine. Electronically signed by: Greig Pique MD MD 12/16/2024  05:41 PM EST RP Workstation: [id number]    Procedures Procedures (including critical care time)  Medications Ordered in UC Medications - No data to display  Initial Impression / Assessment and Plan / UC Course  I have reviewed the triage vital signs and the nursing notes.  Pertinent labs & imaging results that were available during my care of the patient were reviewed by me and considered in my medical decision making (see chart for details).     MDM: 1.  Acute bilateral low back pain without sciatica-, spine x-ray results revealed above, patient advised-Rx'd Celebrex  200 mg capsule: Take 1 capsule daily x 15 days; 2.  Strain of lumbar region, initial encounter-Rx'd Sterapred Unipak (42 tab 10 mg taper): Take as directed; 3.  Muscle spasm of back-Rx'd Robaxin  500 mg tablet: Take 1 tablet twice daily, as needed for back spasms. Advised patient of lumbar spine x-ray results with hardcopy provided.  Advised patient take medications (Sterapred Unipak and Celebrex ) as directed with food to completion.  Advised may use Robaxin  daily or as needed for muscle spasms of back encouraged increase daily water intake to 64 ounces per day while taking these medications. Final Clinical Impressions(s) / UC Diagnoses   Final diagnoses:  Acute bilateral low back pain without sciatica  Strain of lumbar region, initial encounter  Muscle spasm of back     Discharge Instructions      Advised patient of lumbar spine x-ray results with hardcopy provided.  Advised patient take medications (Sterapred Unipak and Celebrex ) as directed with food to completion.  Advised may use Robaxin  daily  or as needed for muscle spasms of back encouraged increase daily water intake to 64 ounces per day while taking these medications.     ED Prescriptions     Medication Sig Dispense Auth. Provider   predniSONE  (STERAPRED UNI-PAK 21 TAB) 10 MG (21) TBPK tablet Take by mouth daily. Take 6 tabs by mouth daily  for 2 days, then  5 tabs for 2 days, then 4 tabs for 2 days, then 3 tabs for 2 days, 2 tabs for 2 days, then 1 tab by mouth daily for 2 days 42 tablet Teddy Sharper, FNP   celecoxib  (CELEBREX ) 200 MG capsule Take 1 capsule (200 mg total) by mouth daily for 15 days. 15 capsule Airiel Oblinger, FNP   methocarbamol  (ROBAXIN ) 500 MG tablet Take 1 tablet (500 mg total) by mouth 2 (two) times daily. 20 tablet Kaelum Kissick, FNP      PDMP not reviewed this encounter.    [1]  Social History Tobacco Use   Smoking status: Former    Types: Cigars   Smokeless tobacco: Former   Tobacco comments:    occasional cigar  Vaping Use   Vaping status: Former  Substance Use Topics   Alcohol use: Yes    Alcohol/week: 0.0 standard drinks of alcohol    Comment: occasional   Drug use: No     Teddy Sharper, FNP 12/16/24 1805  "

## 2024-12-16 NOTE — ED Triage Notes (Addendum)
 Pt lower LT sided back pain intermittently x 6 months. Tylenol  and motrin prn. Pain worsening in last couple days with movement. Does not radiate to buttocks or leg. No previous hx of back issues.

## 2024-12-17 ENCOUNTER — Other Ambulatory Visit: Payer: Self-pay

## 2024-12-21 ENCOUNTER — Other Ambulatory Visit: Payer: Self-pay

## 2024-12-21 ENCOUNTER — Telehealth: Payer: Self-pay

## 2024-12-21 DIAGNOSIS — E349 Endocrine disorder, unspecified: Secondary | ICD-10-CM

## 2024-12-21 MED ORDER — "SAFETY SYRINGES/NEEDLE 22G X 1-1/2"" 5 ML MISC": 1 refills | Status: AC

## 2024-12-21 NOTE — Telephone Encounter (Signed)
Sent refills into the pharmacy for patient

## 2024-12-21 NOTE — Telephone Encounter (Signed)
-----   Message from Garnette Shack, MD sent at 12/21/2024 12:36 PM EST ----- Can you just call in a refill for these?

## 2024-12-22 ENCOUNTER — Other Ambulatory Visit: Payer: Self-pay

## 2024-12-22 ENCOUNTER — Telehealth: Payer: Self-pay

## 2024-12-22 DIAGNOSIS — E349 Endocrine disorder, unspecified: Secondary | ICD-10-CM

## 2024-12-22 MED ORDER — "SYRINGE/NEEDLE (DISP) 22G X 1-1/2"" 3 ML MISC": 1 refills | Status: AC

## 2024-12-22 NOTE — Telephone Encounter (Signed)
-----   Message from Newell T sent at 12/22/2024 12:58 PM EST ----- Regarding: [facility]/ [facility] syringe [facility]- they do not carry 5 ml syringes, please put in another Rx req for 3 ml.   ((((SYRINGE-NEEDLE, DISP, 5 ML (SAFETY SYRINGES/NEEDLE) 22G X 1-1/2 5 ML MISC))))

## 2024-12-22 NOTE — Telephone Encounter (Signed)
 Adjusted syringe Rx to 3mL syringes.

## 2025-01-06 ENCOUNTER — Ambulatory Visit: Admitting: Urology

## 2025-01-06 NOTE — Progress Notes (Unsigned)
 "    Assessment: 1.  Hypogonadism.  On supraphysiologic dosing of testosterone  thus far.  With him being off of testosterone  recently, it was quite low at 114.  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  He does have diabetes which is under good control.  Tadalafil  not working that well   Plan: 1.  I will restart him on repletion, but with hopefully physiologic dosing-testosterone  cypionate 100 mg IM weekly.  Prescription was provided  2.  I will have him drop in in a couple of months for testosterone  panel as well as hematocrit/hemoglobin  3.  I will give him a prescription for sildenafil .  If he finds that this does not work well, I would have him consider intracorporal injections with prostaglandin.  He will call us  if he thinks he needs to have this done  4.  Follow-up here in 1 year    History of Present Illness:  11.17.2025: 54 year old male presents at this time for further evaluation and management of low testosterone  levels.  Most recent testosterone  data: February/2023--152 September/2023--1400 June/2024--1947 January/2025--1745 October/2025--114  He is a tenured Event organiser.  He began testosterone  repletion 10 years ago.  He started this with Dr. Elsie Richards.  Typical injections were on Saturdays or Sundays, 200 mg IM once a week.  His hemoglobin levels have always been normal, most recent PSA in June 2024 was 0.38.  No significant lower urinary tract concerns.  Does have ED.  Not working that well with 20 mg of Cialis .  2.2.2026:  Past Medical History:  Past Medical History:  Diagnosis Date   Diabetes mellitus without complication (HCC)    Hyperlipidemia    Hypertension    Other testicular hypofunction    Prediabetes    Sleep apnea     Past Surgical History:  Past Surgical History:  Procedure Laterality Date   ROTATOR CUFF REPAIR Right 2007   SHOULDER ARTHROSCOPY Right 2000    Allergies:  Allergies  Allergen Reactions    Amoxicillin Nausea Only    Family History:  Family History  Problem Relation Age of Onset   Healthy Mother    Heart disease Father    Hyperlipidemia Father    Hypertension Father    Colon cancer Neg Hx    Esophageal cancer Neg Hx    Pancreatic cancer Neg Hx     Social History:  Social History   Tobacco Use   Smoking status: Former    Types: Cigars   Smokeless tobacco: Former   Tobacco comments:    occasional cigar  Vaping Use   Vaping status: Former  Substance Use Topics   Alcohol use: Yes    Alcohol/week: 0.0 standard drinks of alcohol    Comment: occasional   Drug use: No    Review of symptoms:  Constitutional:  Negative for unexplained weight loss, night sweats, fever, chills ENT:  Negative for nose bleeds, sinus pain, painful swallowing CV:  Negative for chest pain, shortness of breath, exercise intolerance, palpitations, loss of consciousness Resp:  Negative for cough, wheezing, shortness of breath GI:  Negative for nausea, vomiting, diarrhea, bloody stools GU:  Positives noted in HPI; otherwise negative for gross hematuria, dysuria, urinary incontinence Neuro:  Negative for seizures, poor balance, limb weakness, slurred speech Psych:  Negative for lack of energy, depression, anxiety Endocrine:  Negative for polydipsia, polyuria, symptoms of hypoglycemia (dizziness, hunger, sweating) Hematologic:  Negative for anemia, purpura, petechia, prolonged or excessive bleeding, use of anticoagulants  Allergic:  Negative for difficulty breathing or choking as a result of exposure to anything; no shellfish allergy; no allergic response (rash/itch) to materials, foods  Physical exam: There were no vitals taken for this visit. GENERAL APPEARANCE:  Well appearing, well developed, well nourished, NAD HEENT: Atraumatic, Normocephalic. NECK: Normal appearance LUNGS: Normal inspiratory and expiratory excursion HEART: Regular Rate ABDOMEN: No inguinal hernias GU: Phallus  normal, no lesions. Scrotal skin normal. Testicles/epididymal structures normal except for slight testicular atrophy bilaterally. Meatus normal.  EXTREMITIES: Moves all extremities well.  Without clubbing, cyanosis, or edema. NEUROLOGIC:  Alert and oriented x 3, normal gait, CN II-XII grossly intact.  MENTAL STATUS:  Appropriate. SKIN:  Warm, dry and intact.    Results:  I have reviewed referring/prior physicians notes  I have reviewed PSA results from June 2024--0.38    "

## 2025-02-03 ENCOUNTER — Ambulatory Visit: Admitting: Urology

## 2025-03-30 ENCOUNTER — Ambulatory Visit: Admitting: Family Medicine
